# Patient Record
Sex: Female | Born: 1935
Health system: Southern US, Community
[De-identification: ages and names within clinical notes are randomized; demographics above are authoritative.]

## PROBLEM LIST (undated history)

## (undated) DIAGNOSIS — I10 Essential (primary) hypertension: Secondary | ICD-10-CM

## (undated) DIAGNOSIS — E0590 Thyrotoxicosis, unspecified without thyrotoxic crisis or storm: Secondary | ICD-10-CM

## (undated) DIAGNOSIS — Z7189 Other specified counseling: Secondary | ICD-10-CM

## (undated) DIAGNOSIS — R0609 Other forms of dyspnea: Secondary | ICD-10-CM

## (undated) DIAGNOSIS — A088 Other specified intestinal infections: Secondary | ICD-10-CM

## (undated) DIAGNOSIS — R0902 Hypoxemia: Secondary | ICD-10-CM

## (undated) DIAGNOSIS — C8583 Other specified types of non-Hodgkin lymphoma, intra-abdominal lymph nodes: Secondary | ICD-10-CM

## (undated) DIAGNOSIS — E119 Type 2 diabetes mellitus without complications: Secondary | ICD-10-CM

## (undated) DIAGNOSIS — R0989 Other specified symptoms and signs involving the circulatory and respiratory systems: Secondary | ICD-10-CM

## (undated) DIAGNOSIS — K909 Intestinal malabsorption, unspecified: Secondary | ICD-10-CM

## (undated) DIAGNOSIS — R630 Anorexia: Secondary | ICD-10-CM

## (undated) DIAGNOSIS — J189 Pneumonia, unspecified organism: Secondary | ICD-10-CM

## (undated) DIAGNOSIS — C8307 Small cell B-cell lymphoma, spleen: Secondary | ICD-10-CM

## (undated) HISTORY — DX: Hypoxemia: R09.02

## (undated) HISTORY — DX: Small cell b-cell lymphoma, spleen: C83.07

## (undated) HISTORY — PX: CATARACT EXTRACTION: SUR2

## (undated) HISTORY — DX: Intestinal malabsorption, unspecified: K90.9

## (undated) HISTORY — DX: Pneumonia, unspecified organism: J18.9

## (undated) HISTORY — DX: Other specified symptoms and signs involving the circulatory and respiratory systems: R09.89

## (undated) HISTORY — DX: Other specified types of non-hodgkin lymphoma, intra-abdominal lymph nodes: C85.83

## (undated) HISTORY — DX: Other specified intestinal infections: A08.8

## (undated) HISTORY — DX: Other forms of dyspnea: R06.09

## (undated) HISTORY — DX: Anorexia: R63.0

## (undated) HISTORY — DX: Thyrotoxicosis, unspecified without thyrotoxic crisis or storm: E05.90

---
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
## 2010-09-02 DIAGNOSIS — I379 Nonrheumatic pulmonary valve disorder, unspecified: Secondary | ICD-10-CM | POA: Insufficient documentation

## 2011-09-14 ENCOUNTER — Telehealth: Payer: Self-pay | Admitting: Hematology & Oncology

## 2011-09-14 NOTE — Telephone Encounter (Signed)
Pt aware of 7-26 appointment

## 2011-09-15 ENCOUNTER — Other Ambulatory Visit: Payer: Self-pay

## 2011-09-16 ENCOUNTER — Ambulatory Visit (HOSPITAL_BASED_OUTPATIENT_CLINIC_OR_DEPARTMENT_OTHER): Payer: Medicare Other | Admitting: Medical

## 2011-09-16 ENCOUNTER — Other Ambulatory Visit (HOSPITAL_BASED_OUTPATIENT_CLINIC_OR_DEPARTMENT_OTHER): Payer: Medicare Other | Admitting: Lab

## 2011-09-16 ENCOUNTER — Ambulatory Visit: Payer: Medicare Other

## 2011-09-16 ENCOUNTER — Ambulatory Visit: Payer: Medicare Other | Admitting: Hematology & Oncology

## 2011-09-16 ENCOUNTER — Encounter: Payer: Self-pay | Admitting: Medical

## 2011-09-16 VITALS — BP 111/62 | HR 88 | Temp 97.1°F | Ht <= 58 in | Wt 153.0 lb

## 2011-09-16 DIAGNOSIS — I517 Cardiomegaly: Secondary | ICD-10-CM

## 2011-09-16 DIAGNOSIS — I079 Rheumatic tricuspid valve disease, unspecified: Secondary | ICD-10-CM

## 2011-09-16 DIAGNOSIS — IMO0002 Reserved for concepts with insufficient information to code with codable children: Secondary | ICD-10-CM

## 2011-09-16 DIAGNOSIS — R809 Proteinuria, unspecified: Secondary | ICD-10-CM | POA: Insufficient documentation

## 2011-09-16 DIAGNOSIS — I10 Essential (primary) hypertension: Secondary | ICD-10-CM

## 2011-09-16 DIAGNOSIS — K219 Gastro-esophageal reflux disease without esophagitis: Secondary | ICD-10-CM

## 2011-09-16 DIAGNOSIS — R209 Unspecified disturbances of skin sensation: Secondary | ICD-10-CM

## 2011-09-16 DIAGNOSIS — R0789 Other chest pain: Secondary | ICD-10-CM

## 2011-09-16 DIAGNOSIS — K5730 Diverticulosis of large intestine without perforation or abscess without bleeding: Secondary | ICD-10-CM

## 2011-09-16 DIAGNOSIS — R161 Splenomegaly, not elsewhere classified: Secondary | ICD-10-CM

## 2011-09-16 DIAGNOSIS — T783XXA Angioneurotic edema, initial encounter: Secondary | ICD-10-CM

## 2011-09-16 DIAGNOSIS — I059 Rheumatic mitral valve disease, unspecified: Secondary | ICD-10-CM

## 2011-09-16 DIAGNOSIS — E119 Type 2 diabetes mellitus without complications: Secondary | ICD-10-CM | POA: Insufficient documentation

## 2011-09-16 DIAGNOSIS — E785 Hyperlipidemia, unspecified: Secondary | ICD-10-CM

## 2011-09-16 DIAGNOSIS — R252 Cramp and spasm: Secondary | ICD-10-CM

## 2011-09-16 DIAGNOSIS — D509 Iron deficiency anemia, unspecified: Secondary | ICD-10-CM

## 2011-09-16 DIAGNOSIS — D649 Anemia, unspecified: Secondary | ICD-10-CM

## 2011-09-16 DIAGNOSIS — M546 Pain in thoracic spine: Secondary | ICD-10-CM

## 2011-09-16 DIAGNOSIS — R935 Abnormal findings on diagnostic imaging of other abdominal regions, including retroperitoneum: Secondary | ICD-10-CM

## 2011-09-16 DIAGNOSIS — I359 Nonrheumatic aortic valve disorder, unspecified: Secondary | ICD-10-CM

## 2011-09-16 DIAGNOSIS — R059 Cough, unspecified: Secondary | ICD-10-CM

## 2011-09-16 DIAGNOSIS — I499 Cardiac arrhythmia, unspecified: Secondary | ICD-10-CM

## 2011-09-16 DIAGNOSIS — R109 Unspecified abdominal pain: Secondary | ICD-10-CM | POA: Insufficient documentation

## 2011-09-16 DIAGNOSIS — R05 Cough: Secondary | ICD-10-CM

## 2011-09-16 DIAGNOSIS — I379 Nonrheumatic pulmonary valve disorder, unspecified: Secondary | ICD-10-CM | POA: Insufficient documentation

## 2011-09-16 DIAGNOSIS — L299 Pruritus, unspecified: Secondary | ICD-10-CM

## 2011-09-16 DIAGNOSIS — G4710 Hypersomnia, unspecified: Secondary | ICD-10-CM

## 2011-09-16 DIAGNOSIS — G5600 Carpal tunnel syndrome, unspecified upper limb: Secondary | ICD-10-CM

## 2011-09-16 DIAGNOSIS — J309 Allergic rhinitis, unspecified: Secondary | ICD-10-CM

## 2011-09-16 DIAGNOSIS — E876 Hypokalemia: Secondary | ICD-10-CM

## 2011-09-16 DIAGNOSIS — M1990 Unspecified osteoarthritis, unspecified site: Secondary | ICD-10-CM

## 2011-09-16 DIAGNOSIS — M25519 Pain in unspecified shoulder: Secondary | ICD-10-CM

## 2011-09-16 DIAGNOSIS — Z79899 Other long term (current) drug therapy: Secondary | ICD-10-CM

## 2011-09-16 LAB — CBC WITH DIFFERENTIAL (CANCER CENTER ONLY)
BASO#: 0 10*3/uL (ref 0.0–0.2)
Eosinophils Absolute: 0.1 10*3/uL (ref 0.0–0.5)
HGB: 9.2 g/dL — ABNORMAL LOW (ref 11.6–15.9)
LYMPH#: 3.4 10*3/uL — ABNORMAL HIGH (ref 0.9–3.3)
MONO#: 0.6 10*3/uL (ref 0.1–0.9)
MONO%: 7.9 % (ref 0.0–13.0)
NEUT#: 3.9 10*3/uL (ref 1.5–6.5)
Platelets: 205 10*3/uL (ref 145–400)
RBC: 3.03 10*6/uL — ABNORMAL LOW (ref 3.70–5.32)
WBC: 8 10*3/uL (ref 3.9–10.0)

## 2011-09-16 LAB — CHCC SATELLITE - SMEAR

## 2011-09-16 LAB — TECHNOLOGIST REVIEW CHCC SATELLITE

## 2011-09-16 LAB — RETICULOCYTES (CHCC): Retic Ct Pct: 6.2 % — ABNORMAL HIGH (ref 0.4–2.3)

## 2011-09-16 NOTE — H&P (Signed)
NEW PATIENT EVALUATION  . Patient ID: Kristine Sullivan, female   DOB: April 19, 1931, 78 y.o.   MRN: [id number]  Chief Complaint: "I was told I am anemic and my spleen is enlarged."  HPI Kristine Sullivan is a 78 y.o. female who was referred to our office by Dr. Willa Rough at internal [facility].  This is a very pleasant, lady, with multiple medical problems, such as diabetes, hypertension, left ventricular hypertrophy, mitral regurgitation, pulmonary valve regurgitation, and tricuspid regurgitation.  Kristine Sullivan also presents with polypharmacy.  Kristine Sullivan recently had seen her primary care physician at which time had received information from Ms. [facility] allergist.  He allergist had noted from prior blood work that Kristine Sullivan had an elevated total bilirubin, as well as anemia, and as such, ordered an abdominal ultrasound to evaluate the abnormal labs.  The upper abdominal ultrasound revealed no evidence of cholelithiasis, gallbladder wall thickening, or pericholecystic fluid.  No biliary dilation with common duct, measuring 3 mm.  No focal mass apparent within the liver.  There is enlargement of the spleen, measuring 19 cm in length.  No focal intrasplenic masses apparent.  The pancreas, kidneys, aorta, and IVC are unremarkable.  Kristine Sullivan had a wide range of labs performed on her including an LDH that was abnormal at 291.  Her haptoglobin was normal at 54 reticulocyte.  Count at 4.5%.  Her hemoglobin at that time was 10.1, hematocrit 31.2, MCV 90.2, platelets 182,000.  She had an iron panel iron was 79, transferrin 352.5, TIBC 494, and 16% saturation.  Her ferritin was 69.4, microalbumin was elevated at 2.6, BUN 12, creatinine 0.8, total bili 1.7, AST 26, ALT 33, albumin 4.0.  Her sedimentation rate was elevated at 90.  Her C. reactive protein was elevated at 2.3.  There is obviously, some type of process going on that's causing her to lose, red blood cells, prematurely.  Overall, Kristine Sullivan, reports, that for  the most part.  She feels relatively well.  She does not report any type of fevers, chills, or night sweats.  She denies any enlarged lymph nodes.  She denies any type of abdominal pain, or back pain.  She has an excellent appetite.  She denies any nausea, vomiting, diarrhea, or constipation.  She denies any cough, chest pain, or shortness of breath.  She denies any dizziness or syncope.  She denies any obvious, bleeding.  She denies any excessive fatigue.  She does have muscle cramps in her feet, but denies any type of lower any edema.  She denies any bowel or bladder issues. HPI-see above    Past Medical History  Diagnosis Date  . Anorexia   . Pneumonia, organism unspecified   . Other dyspnea and respiratory abnormality   . Thyrotoxicosis without mention of goiter or other cause, without mention of thyrotoxic crisis or storm   . Hypoxemia   . Intestinal infection due to other organism, not elsewhere classified     Past Surgical History  Procedure Date  . Cataract extraction     No family history on file.  Social History History  Substance Use Topics  . Smoking status: former  . Smokeless tobacco: none  . Alcohol Use: none    No Known Allergies  Current Outpatient Prescriptions  Medication Sig Dispense Refill  . amLODipine (NORVASC) 10 MG tablet Take 10 mg by mouth daily.      [name] aspirin 325 MG tablet Take 325 mg by mouth daily.      . danazol (  DANOCRINE) 100 MG capsule Take 100 mg by mouth daily. For angioedema      . ferrous sulfate 325 (65 FE) MG tablet Take 325 mg by mouth daily with breakfast.      . fish oil-omega-3 fatty acids 1000 MG capsule Take by mouth daily. 6 tabs po daily with meals divided doses      . furosemide (LASIX) 40 MG tablet Take 40 mg by mouth daily.      . hydrOXYzine (ATARAX/VISTARIL) 25 MG tablet Take 25 mg by mouth at bedtime as needed.      . lovastatin (MEVACOR) 40 MG tablet Take 40 mg by mouth 2 (two) times daily.      . metFORMIN  (GLUCOPHAGE-XR) 750 MG 24 hr tablet Take 750 mg by mouth 2 (two) times daily.      . potassium chloride (K-DUR,KLOR-CON) 10 MEQ tablet Take 10 mEq by mouth daily with breakfast.      . timolol (TIMOPTIC) 0.5 % ophthalmic solution 1 drop 2 (two) times daily.      [name] triamterene-hydrochlorothiazide (DYAZIDE) 37.5-25 MG per capsule Take 1 capsule by mouth every morning.         Review of Systems Significant for b/l foot cramps otherwise, Pt. Denies any changes in their vision, hearing, adenopathy, fevers, chills, nausea, vomiting, diarrhea, constipation, chest pain, shortness of breath, passing blood, passing out, blacking out,  any changes in skin, joints, neurologic or psychiatric except as noted. Blood pressure 111/62, pulse 88, temperature 97.1 F (36.2 C), temperature source Oral, height 4\' 10"  (1.473 m), weight 153 lb (69.4 kg).   Physical Exam this is a pleasant, 78 year old, African American, female, in no obvious distress Vitals:Blood pressure 111/62, pulse 88, temperature 97.1 F (36.2 C), temperature source Oral, height 4\' 10"  (1.473 m), weight 153 lb (69.4 kg) HEENT reveals a normocephalic, atraumatic skull, no scleral icterus, no oral lesions  Neck is supple without any cervical or supraclavicular adenopathy.  Lungs are clear to auscultation bilaterally. There are no wheezes, rales or rhonci Cardiac is regular rate and rhythm with a normal S1 and S2. There are no murmurs, rubs, or bruits.  Abdomen is soft with good bowel sounds there's a palpable mass. There is no palpable hepatomegaly. There is no palpable fluid wave. +palpable spleen with mild ttp Musculoskeletal no tenderness of the spine, ribs, or hips.  Extremities there are no clubbing, cyanosis, or edema.  Skin no petechia, purpura or ecchymosis Neurologic is nonfocal.   Data Reviewed IMAGING: UPPER ABDOMINAL U/S 07/26/11 no evidence of cholelithiasis, gallbladder wall thickening, or pericholecystic fluid.  No biliary  dilation with common duct, measuring 3 mm.  No focal mass apparent within the liver.  There is enlargement of the spleen, measuring 19 cm in length.  No focal intrasplenic masses apparent.  The pancreas, kidneys, aorta, and IVC are unremarkable.   Laboratory Data: White count 8.0, hemoglobin 9.2, hematocrit 27.4, platelets 205,000, MCV 90 Peripheral smear revealed spherocytes, nucleated red cells, and cleaved lymphocytes   Assessment/Plan: This is a pleasant, 77 year old, African American, female, with the following issues: #1 anemia with splenomegaly-this possibly could be lymphoma of the spleen.  As such, we will go ahead and have interventional radiology do a bone marrow, biopsy.  Ms. Mccombs is supposed to get back in touch with [place] as to when she can do this hopefully sometime next week.  She does understand the necessity of this.  I do not feel this is iron deficiency.  As such, she was  informed to stop her iron.  We will go ahead and did her on folic acid.  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  I do not feel this is a myelofibrosis.  We will go ahead and run an SPEP on her to see if there is an M spike detected, but I really feel the bone marrow, biopsy is going to be definitive.  Once we get the results back from the bone marrow, biopsy.  We will know which direction to move in.  #2 followup-we will follow back up with Ms. Cabriales after we get the results of her bone marrow, biopsy.  We will see her before then should there be questions or concerns.  The above assessment and plan was in collaboration with Dr. Myna Hidalgo, who also saw and examined the patient.               Eunice Blase 09/16/2011, 10:23 AM

## 2011-09-16 NOTE — Progress Notes (Signed)
This office note has been dictated.

## 2011-09-28 LAB — DIRECT ANTIGLOBULIN TEST (NOT AT ARMC)
DAT (Complement): NEGATIVE
DAT IgG: NEGATIVE

## 2011-09-28 LAB — RBC OSMOTIC FRAGILITY
NACL  0.40%: 72
NACL  0.45%: 57
NACL  0.55%: 21
NACL  0.60%: 10
NACL  0.65%: 3

## 2011-09-28 LAB — PROTEIN ELECTROPHORESIS, SERUM
Albumin ELP: 59.9 % (ref 55.8–66.1)
Alpha-1-Globulin: 7.3 % — ABNORMAL HIGH (ref 2.9–4.9)
Beta 2: 4.8 % (ref 3.2–6.5)
Gamma Globulin: 9.7 % — ABNORMAL LOW (ref 11.1–18.8)
Total Protein, Serum Electrophoresis: 6.3 g/dL (ref 6.0–8.3)

## 2011-09-28 LAB — FERRITIN: Ferritin: 234 ng/mL (ref 10–291)

## 2011-09-28 LAB — RETICULOCYTES (CHCC)
ABS Retic: 214.5 10*3/uL — ABNORMAL HIGH (ref 19.0–186.0)
RBC.: 3.46 MIL/uL — ABNORMAL LOW (ref 3.87–5.11)

## 2011-09-28 LAB — IRON AND TIBC: TIBC: 528 ug/dL — ABNORMAL HIGH (ref 250–470)

## 2011-09-28 LAB — HAPTOGLOBIN: Haptoglobin: 58 mg/dL (ref 45–215)

## 2011-10-03 ENCOUNTER — Encounter: Payer: Self-pay | Admitting: *Deleted

## 2011-10-03 NOTE — Progress Notes (Unsigned)
Per Dr. Myna Hidalgo, pt to have Bone Marrow Bx Tuesday 10/11/11 at 0800.  Pt to be here at 0745.  Called pt to give her this date and time.  States this is a good time for here.

## 2011-10-11 ENCOUNTER — Other Ambulatory Visit (HOSPITAL_COMMUNITY)
Admission: RE | Admit: 2011-10-11 | Discharge: 2011-10-11 | Disposition: A | Discharge status: Home / Self Care | Payer: Medicare Other | Source: Ambulatory Visit | Attending: Hematology & Oncology | Admitting: Hematology & Oncology

## 2011-10-11 ENCOUNTER — Other Ambulatory Visit: Payer: Self-pay | Admitting: Hematology & Oncology

## 2011-10-11 ENCOUNTER — Other Ambulatory Visit: Payer: Medicare Other | Admitting: Lab

## 2011-10-11 ENCOUNTER — Ambulatory Visit: Payer: Medicare Other

## 2011-10-11 ENCOUNTER — Encounter (HOSPITAL_COMMUNITY)
Admission: RE | Admit: 2011-10-11 | Discharge: 2011-10-11 | Disposition: A | Discharge status: Home / Self Care | Payer: Medicare Other | Source: Ambulatory Visit | Attending: Hematology & Oncology | Admitting: Hematology & Oncology

## 2011-10-11 ENCOUNTER — Ambulatory Visit (HOSPITAL_BASED_OUTPATIENT_CLINIC_OR_DEPARTMENT_OTHER): Payer: Medicare Other | Admitting: Hematology & Oncology

## 2011-10-11 VITALS — BP 108/64 | HR 85 | Temp 97.8°F | Resp 18

## 2011-10-11 VITALS — BP 120/71 | HR 89 | Temp 98.6°F | Resp 20 | Ht 60.0 in | Wt 152.0 lb

## 2011-10-11 DIAGNOSIS — D649 Anemia, unspecified: Secondary | ICD-10-CM

## 2011-10-11 DIAGNOSIS — R161 Splenomegaly, not elsewhere classified: Secondary | ICD-10-CM | POA: Insufficient documentation

## 2011-10-11 DIAGNOSIS — D599 Acquired hemolytic anemia, unspecified: Secondary | ICD-10-CM | POA: Insufficient documentation

## 2011-10-11 DIAGNOSIS — D589 Hereditary hemolytic anemia, unspecified: Secondary | ICD-10-CM

## 2011-10-11 DIAGNOSIS — E119 Type 2 diabetes mellitus without complications: Secondary | ICD-10-CM

## 2011-10-11 LAB — CBC WITH DIFFERENTIAL (CANCER CENTER ONLY)
BASO%: 0.2 % (ref 0.0–2.0)
HCT: 17.5 % — ABNORMAL LOW (ref 34.8–46.6)
LYMPH%: 44.1 % (ref 14.0–48.0)
MCH: 34.7 pg — ABNORMAL HIGH (ref 26.0–34.0)
MCV: 105 fL — ABNORMAL HIGH (ref 81–101)
MONO%: 6.1 % (ref 0.0–13.0)
NEUT%: 48.8 % (ref 39.6–80.0)
Platelets: 222 10*3/uL (ref 145–400)
RDW: 30.9 % — ABNORMAL HIGH (ref 11.1–15.7)

## 2011-10-11 LAB — TECHNOLOGIST REVIEW CHCC SATELLITE

## 2011-10-11 LAB — RETICULOCYTES (CHCC): Retic Ct Pct: 17.2 % — ABNORMAL HIGH (ref 0.4–2.3)

## 2011-10-11 LAB — HOLD TUBE, BLOOD BANK - CHCC SATELLITE

## 2011-10-11 LAB — ABO/RH: ABO/RH(D): A POS

## 2011-10-11 MED ORDER — ACETAMINOPHEN 325 MG PO TABS
650.0000 mg | ORAL_TABLET | Freq: Once | ORAL | Status: AC
  Administered 2011-10-11: 650 mg via ORAL

## 2011-10-11 MED ORDER — SODIUM CHLORIDE 0.9 % IV SOLN: 250.0000 mL | Freq: Once | INTRAVENOUS | Status: AC

## 2011-10-11 NOTE — Progress Notes (Signed)
We did a bone marrow biopsy and aspirate today on [name]. She came to the treatment room at our cancer office. She signed consent. We do the appropriate timeout procedure.  She was placed onto her right side. The left posterior iliac crest region was prepped and draped in sterile fashion. 8 cc of 2% lidocaine were infiltrated under the skin down to the periosteum.  A #11 scalpel was used to make an incision into the skin. We then obtained a bone marrow aspirate without success. We tried this on several occasions.  We then used the Jamshidi bone marrow biopsy needle. When I was locked into the marrow space, I tried another aspirate with success. This was sent off for flow cytometry and cytogenetics.  We got 2 bone marrow biopsies.  With her CBC, with her hemoglobin was down to 5.8. She has homolysis of unclear urology. All of her studies have come up unremarkable. She does have elevated LDH of 450. She does not have a monoclonal spike on SPEP. Her Coombs analysis is negative for an autoimmune homolysis.  We will transfuse her with 2 units of blood. I am sending off a cold agglutinin titer. She does have an enlarged spleen. I am considering a splenic lymphoma or other low-grade lymphoma as a possibility for this homolysis.  We will put her on folic acid. She is diabetic so I want to try to on steroids.  We will get her back in a week so we can check her blood count and hopefully have a diagnosis for her so we can move ahead with definitive therapy.  She is a little bit short of breath. There's no chest pain. Is no bleeding. There is no abdominal pain. She's had no rashes. There is no change in her medications.  Again, we will transfuse her today. I will use a blood warmer on the possibility of this being cold agglutinins.  Pete E.

## 2011-10-11 NOTE — Patient Instructions (Signed)
Blood Transfusion Information  WHAT IS A BLOOD TRANSFUSION?  A transfusion is the replacement of blood or some of its parts. Blood is made up of multiple cells which provide different functions.   Red blood cells carry oxygen and are used for blood loss replacement.   White blood cells fight against infection.   Platelets control bleeding.   Plasma helps clot blood.   Other blood products are available for specialized needs, such as hemophilia or other clotting disorders.  BEFORE THE TRANSFUSION   Who gives blood for transfusions?    You may be able to donate blood to be used at a later date on yourself (autologous donation).   Relatives can be asked to donate blood. This is generally not any safer than if you have received blood from a stranger. The same precautions are taken to ensure safety when a relative's blood is donated.   Healthy volunteers who are fully evaluated to make sure their blood is safe. This is blood bank blood.  Transfusion therapy is the safest it has ever been in the practice of medicine. Before blood is taken from a donor, a complete history is taken to make sure that person has no history of diseases nor engages in risky social behavior (examples are intravenous drug use or sexual activity with multiple partners). The donor's travel history is screened to minimize risk of transmitting infections, such as malaria. The donated blood is tested for signs of infectious diseases, such as HIV and hepatitis. The blood is then tested to be sure it is compatible with you in order to minimize the chance of a transfusion reaction. If you or a relative donates blood, this is often done in anticipation of surgery and is not appropriate for emergency situations. It takes many days to process the donated blood.  RISKS AND COMPLICATIONS  Although transfusion therapy is very safe and saves many lives, the main dangers of transfusion include:    Getting an infectious disease.   Developing a  transfusion reaction. This is an allergic reaction to something in the blood you were given. Every precaution is taken to prevent this.  The decision to have a blood transfusion has been considered carefully by your caregiver before blood is given. Blood is not given unless the benefits outweigh the risks.  AFTER THE TRANSFUSION   Right after receiving a blood transfusion, you will usually feel much better and more energetic. This is especially true if your red blood cells have gotten low (anemic). The transfusion raises the level of the red blood cells which carry oxygen, and this usually causes an energy increase.   The nurse administering the transfusion will monitor you carefully for complications.  HOME CARE INSTRUCTIONS   No special instructions are needed after a transfusion. You may find your energy is better. Speak with your caregiver about any limitations on activity for underlying diseases you may have.  SEEK MEDICAL CARE IF:    Your condition is not improving after your transfusion.   You develop redness or irritation at the intravenous (IV) site.  SEEK IMMEDIATE MEDICAL CARE IF:   Any of the following symptoms occur over the next 12 hours:   Shaking chills.   You have a temperature by mouth above 102 F (38.9 C), not controlled by medicine.   Chest, back, or muscle pain.   People around you feel you are not acting correctly or are confused.   Shortness of breath or difficulty breathing.   Dizziness and fainting.     You get a rash or develop hives.   You have a decrease in urine output.   Your urine turns a dark color or changes to pink, red, or brown.  Any of the following symptoms occur over the next 10 days:   You have a temperature by mouth above 102 F (38.9 C), not controlled by medicine.   Shortness of breath.   Weakness after normal activity.   The white part of the eye turns yellow (jaundice).   You have a decrease in the amount of urine or are urinating less often.   Your  urine turns a dark color or changes to pink, red, or brown.  Document Released: 02/05/2000 Document Revised: 01/27/2011 Document Reviewed: 09/24/2007  [employer] Patient Information 2012 [employer].

## 2011-10-11 NOTE — Patient Instructions (Signed)
Bone Marrow Aspiration, Bone Marrow Biopsy Care After  Read the instructions outlined below and refer to this sheet in the next few weeks. These discharge instructions provide you with general information on caring for yourself after you leave the hospital. Your caregiver may also give you specific instructions. While your treatment has been planned according to the most current medical practices available, unavoidable complications occasionally occur. If you have any problems or questions after discharge, call your caregiver.  FINDING OUT THE RESULTS OF YOUR TEST  Please make sure you have an appointment with your caregiver to find out the results . Do not assume everything is normal if you have not heard from your caregiver or the medical facility. It is important for you to follow up on all of your test results. Dr. Myna Hidalgo may call you with the results.  HOME CARE INSTRUCTIONS   You have had sedation and may be sleepy or dizzy. Your thinking may not be as clear as usual. For the next 24 hours:   Only take over-the-counter or prescription medicines for pain, discomfort, and or fever as directed by your caregiver.   Keep your dressing clean and dry. You may replace dressing with a bandage after 24 hours.   You may take a bath or shower after 24 hours.   Use an ice pack for 20 minutes every 2 hours while awake for pain as needed.  SEEK MEDICAL CARE IF:   There is redness, swelling, or increasing pain at the biopsy site.   There is pus coming from the biopsy site.   There is drainage from a biopsy site lasting longer than one day.   An unexplained oral temperature above 102 F (38.9 C) develops.  SEEK IMMEDIATE MEDICAL CARE IF:   You develop a rash.   You have difficulty breathing.   You develop any reaction or side effects to medications given.  Document Released: 08/27/2004 Document Revised: 01/27/2011 Document Reviewed: 02/05/2008 [facility] Patient Information 2012  [place], [place].

## 2011-10-12 ENCOUNTER — Encounter: Payer: Self-pay | Admitting: Hematology & Oncology

## 2011-10-12 LAB — TYPE AND SCREEN
Antibody Screen: POSITIVE
DAT, IgG: NEGATIVE

## 2011-10-13 ENCOUNTER — Encounter: Payer: Self-pay | Admitting: Hematology & Oncology

## 2011-10-13 ENCOUNTER — Telehealth: Payer: Self-pay | Admitting: Oncology

## 2011-10-13 NOTE — Telephone Encounter (Signed)
5:23 PM 10/13/2011 Left Ms Latterell a message instructing her to take Folic Acid 2mg  daily. Teola Bradley, Pansie Guggisberg [employer]

## 2011-10-14 LAB — COLD AGGLUTININ TITER: Cold Agglutinin Titer: 1:40 {titer} — AB

## 2011-10-17 ENCOUNTER — Telehealth: Payer: Self-pay | Admitting: Hematology & Oncology

## 2011-10-17 NOTE — Telephone Encounter (Signed)
April for [facility] called and cx patient's appt for 10/18/11 due to patient being in the hospital.  Patient will call back later to resch

## 2011-10-18 ENCOUNTER — Ambulatory Visit: Payer: Medicare Other | Admitting: Medical

## 2011-10-18 ENCOUNTER — Other Ambulatory Visit: Payer: Medicare Other | Admitting: Lab

## 2011-10-25 ENCOUNTER — Encounter (HOSPITAL_COMMUNITY)
Admission: RE | Admit: 2011-10-25 | Discharge: 2011-10-25 | Disposition: A | Discharge status: Home / Self Care | Payer: Medicare Other | Source: Ambulatory Visit | Attending: Hematology & Oncology | Admitting: Hematology & Oncology

## 2011-10-25 DIAGNOSIS — D599 Acquired hemolytic anemia, unspecified: Secondary | ICD-10-CM | POA: Insufficient documentation

## 2011-10-26 ENCOUNTER — Telehealth: Payer: Self-pay | Admitting: Hematology & Oncology

## 2011-10-26 ENCOUNTER — Other Ambulatory Visit (HOSPITAL_BASED_OUTPATIENT_CLINIC_OR_DEPARTMENT_OTHER): Payer: Medicare Other | Admitting: Lab

## 2011-10-26 ENCOUNTER — Ambulatory Visit (HOSPITAL_BASED_OUTPATIENT_CLINIC_OR_DEPARTMENT_OTHER): Payer: Medicare Other | Admitting: Hematology & Oncology

## 2011-10-26 VITALS — Temp 98.6°F | Resp 20 | Ht 60.0 in | Wt 147.0 lb

## 2011-10-26 DIAGNOSIS — C8307 Small cell B-cell lymphoma, spleen: Secondary | ICD-10-CM

## 2011-10-26 DIAGNOSIS — C8589 Other specified types of non-Hodgkin lymphoma, extranodal and solid organ sites: Secondary | ICD-10-CM

## 2011-10-26 DIAGNOSIS — D599 Acquired hemolytic anemia, unspecified: Secondary | ICD-10-CM

## 2011-10-26 DIAGNOSIS — C8590 Non-Hodgkin lymphoma, unspecified, unspecified site: Secondary | ICD-10-CM

## 2011-10-26 DIAGNOSIS — D589 Hereditary hemolytic anemia, unspecified: Secondary | ICD-10-CM

## 2011-10-26 DIAGNOSIS — R161 Splenomegaly, not elsewhere classified: Secondary | ICD-10-CM

## 2011-10-26 LAB — CBC WITH DIFFERENTIAL (CANCER CENTER ONLY)
BASO%: 0.2 % (ref 0.0–2.0)
Eosinophils Absolute: 0.1 10*3/uL (ref 0.0–0.5)
LYMPH#: 5.9 10*3/uL — ABNORMAL HIGH (ref 0.9–3.3)
LYMPH%: 39.3 % (ref 14.0–48.0)
MCV: 110 fL — ABNORMAL HIGH (ref 81–101)
MONO#: 1 10*3/uL — ABNORMAL HIGH (ref 0.1–0.9)
Platelets: 179 10*3/uL (ref 145–400)
RBC: 2.1 10*6/uL — ABNORMAL LOW (ref 3.70–5.32)
RDW: 29.2 % — ABNORMAL HIGH (ref 11.1–15.7)
WBC: 14.9 10*3/uL — ABNORMAL HIGH (ref 3.9–10.0)

## 2011-10-26 LAB — RETICULOCYTES (CHCC)
ABS Retic: 393.6 10*3/uL — ABNORMAL HIGH (ref 19.0–186.0)
RBC.: 2.4 MIL/uL — ABNORMAL LOW (ref 3.87–5.11)
Retic Ct Pct: 16.4 % — ABNORMAL HIGH (ref 0.4–2.3)

## 2011-10-26 LAB — CHCC SATELLITE - SMEAR

## 2011-10-26 LAB — TECHNOLOGIST REVIEW CHCC SATELLITE: Tech Review: 3

## 2011-10-26 NOTE — Telephone Encounter (Signed)
Left message for pt to come in on 10-27-11 at 10am

## 2011-10-27 ENCOUNTER — Other Ambulatory Visit: Payer: Self-pay | Admitting: Hematology & Oncology

## 2011-10-27 ENCOUNTER — Other Ambulatory Visit: Payer: Self-pay | Admitting: Physician Assistant

## 2011-10-27 ENCOUNTER — Encounter: Payer: Self-pay | Admitting: Hematology & Oncology

## 2011-10-27 ENCOUNTER — Ambulatory Visit (HOSPITAL_BASED_OUTPATIENT_CLINIC_OR_DEPARTMENT_OTHER): Payer: Medicare Other

## 2011-10-27 ENCOUNTER — Ambulatory Visit: Payer: Medicare Other

## 2011-10-27 ENCOUNTER — Encounter (HOSPITAL_COMMUNITY): Payer: Self-pay | Admitting: Pharmacy Technician

## 2011-10-27 VITALS — BP 106/50 | HR 80 | Temp 97.3°F | Resp 20

## 2011-10-27 VITALS — BP 103/65 | HR 80 | Temp 98.2°F | Resp 20

## 2011-10-27 DIAGNOSIS — C8307 Small cell B-cell lymphoma, spleen: Secondary | ICD-10-CM

## 2011-10-27 DIAGNOSIS — D599 Acquired hemolytic anemia, unspecified: Secondary | ICD-10-CM

## 2011-10-27 DIAGNOSIS — T783XXA Angioneurotic edema, initial encounter: Secondary | ICD-10-CM

## 2011-10-27 DIAGNOSIS — D649 Anemia, unspecified: Secondary | ICD-10-CM

## 2011-10-27 HISTORY — DX: Small cell b-cell lymphoma, spleen: C83.07

## 2011-10-27 LAB — PREPARE RBC (CROSSMATCH)

## 2011-10-27 MED ORDER — ACETAMINOPHEN 325 MG PO TABS
650.0000 mg | ORAL_TABLET | Freq: Once | ORAL | Status: AC
Start: 2011-10-27 — End: 2011-10-27
  Administered 2011-10-27: 650 mg via ORAL

## 2011-10-27 MED ORDER — SODIUM CHLORIDE 0.9 % IV SOLN
INTRAVENOUS | Status: DC
  Administered 2011-10-27: 11:00:00 via INTRAVENOUS

## 2011-10-27 MED ORDER — FUROSEMIDE 10 MG/ML IJ SOLN
20.0000 mg | Freq: Once | INTRAMUSCULAR | Status: AC
  Administered 2011-10-27: 20 mg via INTRAVENOUS

## 2011-10-27 NOTE — Progress Notes (Signed)
This office note has been dictated.

## 2011-10-27 NOTE — Patient Instructions (Addendum)
Blood Transfusion Information  WHAT IS A BLOOD TRANSFUSION?  A transfusion is the replacement of blood or some of its parts. Blood is made up of multiple cells which provide different functions.   Red blood cells carry oxygen and are used for blood loss replacement.   White blood cells fight against infection.   Platelets control bleeding.   Plasma helps clot blood.   Other blood products are available for specialized needs, such as hemophilia or other clotting disorders.  BEFORE THE TRANSFUSION   Who gives blood for transfusions?    You may be able to donate blood to be used at a later date on yourself (autologous donation).   Relatives can be asked to donate blood. This is generally not any safer than if you have received blood from a stranger. The same precautions are taken to ensure safety when a relative's blood is donated.   Healthy volunteers who are fully evaluated to make sure their blood is safe. This is blood bank blood.  Transfusion therapy is the safest it has ever been in the practice of medicine. Before blood is taken from a donor, a complete history is taken to make sure that person has no history of diseases nor engages in risky social behavior (examples are intravenous drug use or sexual activity with multiple partners). The donor's travel history is screened to minimize risk of transmitting infections, such as malaria. The donated blood is tested for signs of infectious diseases, such as HIV and hepatitis. The blood is then tested to be sure it is compatible with you in order to minimize the chance of a transfusion reaction. If you or a relative donates blood, this is often done in anticipation of surgery and is not appropriate for emergency situations. It takes many days to process the donated blood.  RISKS AND COMPLICATIONS  Although transfusion therapy is very safe and saves many lives, the main dangers of transfusion include:    Getting an infectious disease.   Developing a  transfusion reaction. This is an allergic reaction to something in the blood you were given. Every precaution is taken to prevent this.  The decision to have a blood transfusion has been considered carefully by your caregiver before blood is given. Blood is not given unless the benefits outweigh the risks.  AFTER THE TRANSFUSION   Right after receiving a blood transfusion, you will usually feel much better and more energetic. This is especially true if your red blood cells have gotten low (anemic). The transfusion raises the level of the red blood cells which carry oxygen, and this usually causes an energy increase.   The nurse administering the transfusion will monitor you carefully for complications.  HOME CARE INSTRUCTIONS   No special instructions are needed after a transfusion. You may find your energy is better. Speak with your caregiver about any limitations on activity for underlying diseases you may have.  SEEK MEDICAL CARE IF:    Your condition is not improving after your transfusion.   You develop redness or irritation at the intravenous (IV) site.  SEEK IMMEDIATE MEDICAL CARE IF:   Any of the following symptoms occur over the next 12 hours:   Shaking chills.   You have a temperature by mouth above 102 F (38.9 C), not controlled by medicine.   Chest, back, or muscle pain.   People around you feel you are not acting correctly or are confused.   Shortness of breath or difficulty breathing.   Dizziness and fainting.     You get a rash or develop hives.   You have a decrease in urine output.   Your urine turns a dark color or changes to pink, red, or brown.  Any of the following symptoms occur over the next 10 days:   You have a temperature by mouth above 102 F (38.9 C), not controlled by medicine.   Shortness of breath.   Weakness after normal activity.   The white part of the eye turns yellow (jaundice).   You have a decrease in the amount of urine or are urinating less often.   Your  urine turns a dark color or changes to pink, red, or brown.  Document Released: 02/05/2000 Document Revised: 01/27/2011 Document Reviewed: 09/24/2007  [employer] Patient Information 2012 [employer].

## 2011-10-28 ENCOUNTER — Other Ambulatory Visit: Payer: Self-pay | Admitting: Hematology & Oncology

## 2011-10-28 ENCOUNTER — Ambulatory Visit (HOSPITAL_COMMUNITY)
Admission: RE | Admit: 2011-10-28 | Discharge: 2011-10-28 | Disposition: A | Discharge status: Home / Self Care | Payer: Medicare Other | Source: Ambulatory Visit | Attending: Hematology & Oncology | Admitting: Hematology & Oncology

## 2011-10-28 DIAGNOSIS — C8589 Other specified types of non-Hodgkin lymphoma, extranodal and solid organ sites: Secondary | ICD-10-CM | POA: Insufficient documentation

## 2011-10-28 DIAGNOSIS — C8307 Small cell B-cell lymphoma, spleen: Secondary | ICD-10-CM

## 2011-10-28 DIAGNOSIS — C8590 Non-Hodgkin lymphoma, unspecified, unspecified site: Secondary | ICD-10-CM

## 2011-10-28 DIAGNOSIS — D599 Acquired hemolytic anemia, unspecified: Secondary | ICD-10-CM | POA: Insufficient documentation

## 2011-10-28 LAB — TYPE AND SCREEN
Antibody Screen: POSITIVE
Unit division: 0

## 2011-10-28 MED ORDER — CEFAZOLIN SODIUM 1-5 GM-% IV SOLN
INTRAVENOUS | Status: AC
  Administered 2011-10-28: 1000 mg
  Filled 2011-10-28: qty 50

## 2011-10-28 MED ORDER — LIDOCAINE HCL 1 % IJ SOLN
INTRAMUSCULAR | Status: AC
  Filled 2011-10-28: qty 20

## 2011-10-28 MED ORDER — FENTANYL CITRATE 0.05 MG/ML IJ SOLN
INTRAMUSCULAR | Status: AC
  Filled 2011-10-28: qty 4

## 2011-10-28 MED ORDER — FENTANYL CITRATE 0.05 MG/ML IJ SOLN
INTRAMUSCULAR | Status: AC | PRN
  Administered 2011-10-28: 100 ug via INTRAVENOUS

## 2011-10-28 MED ORDER — MIDAZOLAM HCL 5 MG/5ML IJ SOLN
INTRAMUSCULAR | Status: AC | PRN
  Administered 2011-10-28 (×2): 1 mg via INTRAVENOUS

## 2011-10-28 MED ORDER — SODIUM CHLORIDE 0.9 % IV SOLN: INTRAVENOUS | Status: DC

## 2011-10-28 MED ORDER — CEFAZOLIN SODIUM 1-5 GM-% IV SOLN: 1.0000 g | INTRAVENOUS | Status: DC

## 2011-10-28 MED ORDER — HEPARIN SOD (PORK) LOCK FLUSH 100 UNIT/ML IV SOLN
500.0000 [IU] | Freq: Once | INTRAVENOUS | Status: AC
  Administered 2011-10-28: 500 [IU] via INTRAVENOUS

## 2011-10-28 MED ORDER — MIDAZOLAM HCL 2 MG/2ML IJ SOLN
INTRAMUSCULAR | Status: AC
  Filled 2011-10-28: qty 4

## 2011-10-28 NOTE — H&P (Signed)
Kristine Sullivan is an 78 y.o. female.   Chief Complaint: poor venous access in patient with NHL and anemia. Presents today for port placement.  HPI: see oncology note below :  Kristine Macho, MD Physician Unsigned Transcription  Progress Notes 10/28/2011 5:10 AM  Related encounter: Office Visit from 10/26/2011 in [facility]  CC:   Vinnie Level, MD   DIAGNOSES: 1. Low-grade non-Hodgkin lymphoma. 2. Severe hemolytic anemia, Coombs negative.   CURRENT THERAPY:  Observation.   INTERIM HISTORY:  Kristine Sullivan comes in for a followup.  Unfortunately, Kristine Sullivan had recently been admitted to [facility].  Kristine Sullivan was admitted over there because of severe anemia.  Kristine Sullivan has a significant hemolytic anemia.  This is Coombs negative.  Kristine Sullivan was transfused, I think, 2 units while over at [facility].   Kristine Sullivan has splenomegaly.   We did go ahead and do a bone marrow biopsy on her.  The bone marrow biopsy showed that Kristine Sullivan had involvement with a B-cell non-Hodgkin lymphoma.  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   Again, her hemolysis has been worked up.  This hemolysis is Coombs negative.  Kristine Sullivan has elevated LDH of 148.   Kristine Sullivan did have a protein electrophoresis, which did not show a monoclonal spike.   Kristine Sullivan does feel tired.  Kristine Sullivan does have some swelling about the eyes.  Kristine Sullivan does have this angioedema.  Kristine Sullivan takes Danazol for this.   We really need to get started with therapy on her.  I have to believe that the hemolysis is related to her lymphoma.  Even though Kristine Sullivan is Coombs negative, I have to believe that this is, to some degree, autoimmune in nature.   Kristine Sullivan has had no fever.  Kristine Sullivan has not noticed any palpable lymph glands.   PHYSICAL EXAMINATION:  This is an elderly appearing black female in no obvious distress.  Vital signs:  Temperature of 98.6, pulse 69, respiratory rate 20, blood pressure 103/36.   Weight is 147.  Head and neck:  Normocephalic, atraumatic skull.  There are no ocular or oral lesions.  There are no palpable cervical or supraclavicular lymph nodes. Lungs:  Clear bilaterally.  Cardiac:  Regular rate and rhythm with a normal S1 and S2.  There are no murmurs, rubs or bruits. Abdomen:  Soft with good bowel sounds.  There is no palpable abdominal mass.  There is no fluid wave.  Her spleen tip is palpable just below the left costal margin.  Extremities:  Some trace edema in her lower legs.  Neurologic: No focal neurological deficits.   LABORATORY STUDIES:  White cell count is 14.9, hemoglobin 7.5, hematocrit 23, platelet count 179.  MCV is 110.   IMPRESSION:  [name] is a 78 year old female with a low-grade non- Hodgkin lymphoma.  Again, this has to be triggering this hemolytic process.   We are going to have to transfuse her.  I think Kristine Sullivan is going to need 2 units of blood.   Of note, we have checked cold agglutinins on her.  The cold agglutinin has been negative.   I think that Kristine Sullivan would do well being treated with Rituxan-CVP.  Rituxan is a treatment for angioedema.  Hopefully, we can take care of this issue.   Again, Kristine Sullivan needs to have a Port-A-Cath placed.  We will see about getting this placed in a couple days.   We will  get her in tomorrow for her transfusion.   Hopefully, if we get a quick response to treatment, we will be able to get this hemolysis limited.   I spoke to her and her son for about an hour. This is incredibly difficult situation.  It is not straightforward by any means.   Ms. Cerutti and her son both understand the critical nature of what we are dealing with.   We will plan to get her back tomorrow for blood.  Kristine Sullivan will come back on the 9th for chemotherapy.   We are going to basically have to see her weekly so that we can maintain close vigilance of her hematologic parameters.       ______________________________ Kristine Sullivan,  M.D. PRE/MEDQ  D:  10/27/2011  T:  10/28/2011  Job:  [id number]   Today's examination :    Past Medical History  Diagnosis Date  . Anorexia   . Pneumonia, organism unspecified   . Other dyspnea and respiratory abnormality   . Thyrotoxicosis without mention of goiter or other cause, without mention of thyrotoxic crisis or storm   . Hypoxemia   . Intestinal infection due to other organism, not elsewhere classified   . 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 Cataract extraction    Medication List  As of 10/28/2011  9:07 AM   ASK your doctor about these medications         amLODipine 10 MG tablet   Commonly known as: NORVASC   Take 10 mg by mouth at bedtime.      aspirin 325 MG tablet   Take 325 mg by mouth daily.      danazol 100 MG capsule   Commonly known as: DANOCRINE   Take 100 mg by mouth daily. For angioedema      fish oil-omega-3 fatty acids 1000 MG capsule   Take 1 g by mouth daily.      folic acid 1 MG tablet   Commonly known as: FOLVITE   Take 1 mg by mouth every morning.      furosemide 40 MG tablet   Commonly known as: LASIX   Take 40 mg by mouth daily.      hydrOXYzine 25 MG tablet   Commonly known as: ATARAX/VISTARIL   Take 25 mg by mouth at bedtime as needed. For itching      lovastatin 40 MG tablet   Commonly known as: MEVACOR   Take 40 mg by mouth at bedtime.      metFORMIN 500 MG tablet   Commonly known as: GLUCOPHAGE   Take 500 mg by mouth daily with breakfast.      timolol 0.5 % ophthalmic solution   Commonly known as: TIMOPTIC   Place 1 drop into both eyes 2 (two) times daily.      Travoprost (BAK Free) 0.004 % Soln ophthalmic solution   Commonly known as: TRAVATAN   Place 1 drop into both eyes at bedtime.      triamterene-hydrochlorothiazide 37.5-25 MG per capsule   Commonly known as: DYAZIDE   Take 1 capsule by mouth every morning.            Social History:  does not have a smoking  history on file. Kristine Sullivan does not have any smokeless tobacco history on file. Her alcohol and drug histories not on file.  Allergies: No Known Allergies   Results for orders placed in visit on 10/26/11 (  from the past 48 hour(s))  HOLD TUBE, BLOOD BANK - [facility]     Status: Normal   Collection Time   10/26/11  3:18 PM      Component Value Range Comment   Hold Tube,Blood Bank [facility] CROSSMATCH ADDED       Results for ULANDA, TACKETT (MRN [id number]) as of 10/28/2011 09:07  Ref. Range 10/26/2011 11:04  WBC Latest Range: 3.9-10.0 10e3/uL 14.9 (H)  RBC Latest Range: 3.70-5.32 10e6/uL 2.10 (L)  Hemoglobin Latest Range: 11.6-15.9 g/dL 7.5 (L)  HCT Latest Range: 34.8-46.6 % 23.0 (L)  MCV Latest Range: 81-101 fL 110 (H)  MCH Latest Range: 26.0-34.0 pg 35.7 (H)  MCHC Latest Range: 32.0-36.0 g/dL 30.8  RDW Latest Range: 11.1-15.7 % 29.2 (H)  Platelets Latest Range: 145-400 10e3/uL 179 Large platelets present  NEUT% Latest Range: 39.6-80.0 % 53.1  LYMPH% Latest Range: 14.0-48.0 % 39.3  MONO% Latest Range: 0.0-13.0 % 6.9  EOS% Latest Range: 0.0-7.0 % 0.5  BASO% Latest Range: 0.0-2.0 % 0.2  BASO# Latest Range: 0.0-0.2 10e3/uL 0.0  NEUT# Latest Range: 1.5-6.5 10e3/uL 7.9 (H)  MONO# Latest Range: 0.1-0.9 10e3/uL 1.0 (H)  Eosinophils Absolute Latest Range: 0.0-0.5 10e3/uL 0.1  lymph# Latest Range: 0.9-3.3 10e3/uL 5.9 (H)  Smear Result No range found Smear Available  Tech Review No range found 3% nRBC, occ myelo/meta, Mkd polychromasia, Few ovalocytes, Mod spherocytes        Review of Systems  Constitutional: Positive for malaise/fatigue. Negative for fever and chills.  Respiratory: Positive for shortness of breath. Negative for cough and hemoptysis.   Cardiovascular: Positive for leg swelling. Negative for chest pain and palpitations.  Neurological: Positive for weakness.    Blood pressure 114/65, pulse 76, temperature 98.6 F (37 C), temperature source Oral, resp. rate 16, SpO2  93.00%. Physical Exam  Constitutional: Kristine Sullivan is oriented to person, place, and time. Kristine Sullivan appears well-developed and well-nourished.  HENT:  Head: Normocephalic.  Eyes: Pupils are equal, round, and reactive to light.  Neck: Normal range of motion.  Cardiovascular: Normal rate and regular rhythm.  Exam reveals no gallop and no friction rub.   No murmur heard. Respiratory: Effort normal and breath sounds normal. No respiratory distress. Kristine Sullivan has no wheezes. Kristine Sullivan has no rales.  GI: Soft. Bowel sounds are normal.  Musculoskeletal: Normal range of motion. Kristine Sullivan exhibits no edema.  Lymphadenopathy:    Kristine Sullivan has no cervical adenopathy.  Neurological: Kristine Sullivan is alert and oriented to person, place, and time.  Skin: Skin is warm and dry.  Psychiatric: Kristine Sullivan has a normal mood and affect. Her behavior is normal. Judgment and thought content normal.     Assessment/Plan Procedure details discussed with patient with her apparent understanding. Labs reviewed and Kristine Sullivan is appropriate for [facility] placement. Written consent obtained.   CAMPBELL,PAMELA D 10/28/2011, 9:07 AM

## 2011-10-28 NOTE — Progress Notes (Signed)
CC:   Vinnie Level, MD  DIAGNOSES: 1. Low-grade non-Hodgkin lymphoma. 2. Severe hemolytic anemia, Coombs negative.  CURRENT THERAPY:  Observation.  INTERIM HISTORY:  Ms. Freet comes in for a followup.  Unfortunately, she had recently been admitted to [facility].  She was admitted over there because of severe anemia.  She has a significant hemolytic anemia.  This is Coombs negative.  She was transfused, I think, 2 units while over at [facility].  She has splenomegaly.  We did go ahead and do a bone marrow biopsy on her.  The bone marrow biopsy showed that she had involvement with a B-cell non-Hodgkin lymphoma.  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  Again, her hemolysis has been worked up.  This hemolysis is Coombs negative.  She has elevated LDH of 148.  She did have a protein electrophoresis, which did not show a monoclonal spike.  She does feel tired.  She does have some swelling about the eyes.  She does have this angioedema.  She takes Danazol for this.  We really need to get started with therapy on her.  I have to believe that the hemolysis is related to her lymphoma.  Even though she is Coombs negative, I have to believe that this is, to some degree, autoimmune in nature.  She has had no fever.  She has not noticed any palpable lymph glands.  PHYSICAL EXAMINATION:  This is an elderly appearing black female in no obvious distress.  Vital signs:  Temperature of 98.6, pulse 69, respiratory rate 20, blood pressure 103/36.  Weight is 147.  Head and neck:  Normocephalic, atraumatic skull.  There are no ocular or oral lesions.  There are no palpable cervical or supraclavicular lymph nodes. Lungs:  Clear bilaterally.  Cardiac:  Regular rate and rhythm with a normal S1 and S2.  There are no murmurs, rubs or bruits.  Abdomen:  Soft with good bowel sounds.  There is no palpable abdominal  mass.  There is no fluid wave.  Her spleen tip is palpable just below the left costal margin.  Extremities:  Some trace edema in her lower legs.  Neurologic: No focal neurological deficits.  LABORATORY STUDIES:  White cell count is 14.9, hemoglobin 7.5, hematocrit 23, platelet count 179.  MCV is 110.  IMPRESSION:  Ms. Maring is a 78 year old female with a low-grade non- Hodgkin lymphoma.  Again, this has to be triggering this hemolytic process.  We are going to have to transfuse her.  I think she is going to need 2 units of blood.  Of note, we have checked cold agglutinins on her.  The cold agglutinin has been negative.  I think that she would do well being treated with Rituxan-CVP.  Rituxan is a treatment for angioedema.  Hopefully, we can take care of this issue.  Again, she needs to have a Port-A-Cath placed.  We will see about getting this placed in a couple days.  We will get her in tomorrow for her transfusion.  Hopefully, if we get a quick response to treatment, we will be able to get this hemolysis limited.  I spoke to her and her son for about an hour. This is incredibly difficult situation.  It is not straightforward by any means.  Ms. Trafton and her son both understand the critical nature of what we are dealing with.  We will plan to get her back tomorrow for blood.  She will come back on the 9th for chemotherapy.  We are going to basically have to see her weekly so that we can maintain close vigilance of her hematologic parameters.    ______________________________ [name], M.D. PRE/MEDQ  D:  10/27/2011  T:  10/28/2011  Job:  [id number]

## 2011-10-28 NOTE — Procedures (Signed)
RIJV PAC tip SVC RA No comp 

## 2011-10-31 ENCOUNTER — Ambulatory Visit (HOSPITAL_BASED_OUTPATIENT_CLINIC_OR_DEPARTMENT_OTHER): Payer: Medicare Other

## 2011-10-31 ENCOUNTER — Other Ambulatory Visit (HOSPITAL_BASED_OUTPATIENT_CLINIC_OR_DEPARTMENT_OTHER): Payer: Medicare Other | Admitting: Lab

## 2011-10-31 ENCOUNTER — Other Ambulatory Visit: Payer: Self-pay | Admitting: *Deleted

## 2011-10-31 VITALS — BP 96/50 | HR 83 | Temp 99.0°F | Resp 20

## 2011-10-31 DIAGNOSIS — Z5112 Encounter for antineoplastic immunotherapy: Secondary | ICD-10-CM

## 2011-10-31 DIAGNOSIS — D599 Acquired hemolytic anemia, unspecified: Secondary | ICD-10-CM

## 2011-10-31 DIAGNOSIS — C8307 Small cell B-cell lymphoma, spleen: Secondary | ICD-10-CM

## 2011-10-31 DIAGNOSIS — C8590 Non-Hodgkin lymphoma, unspecified, unspecified site: Secondary | ICD-10-CM

## 2011-10-31 DIAGNOSIS — C8589 Other specified types of non-Hodgkin lymphoma, extranodal and solid organ sites: Secondary | ICD-10-CM

## 2011-10-31 LAB — CBC WITH DIFFERENTIAL (CANCER CENTER ONLY)
BASO#: 0 10*3/uL (ref 0.0–0.2)
BASO%: 0.3 % (ref 0.0–2.0)
Eosinophils Absolute: 0.1 10*3/uL (ref 0.0–0.5)
HCT: 24.4 % — ABNORMAL LOW (ref 34.8–46.6)
HGB: 8.5 g/dL — ABNORMAL LOW (ref 11.6–15.9)
LYMPH%: 41.4 % (ref 14.0–48.0)
MCV: 107 fL — ABNORMAL HIGH (ref 81–101)
MONO#: 0.6 10*3/uL (ref 0.1–0.9)
NEUT%: 53.2 % (ref 39.6–80.0)
RDW: 23.4 % — ABNORMAL HIGH (ref 11.1–15.7)
WBC: 13.5 10*3/uL — ABNORMAL HIGH (ref 3.9–10.0)

## 2011-10-31 LAB — COMPREHENSIVE METABOLIC PANEL
ALT: 10 U/L (ref 0–35)
AST: 29 U/L (ref 0–37)
BUN: 16 mg/dL (ref 6–23)
CO2: 27 mEq/L (ref 19–32)
Creatinine, Ser: 0.6 mg/dL (ref 0.50–1.10)
Total Bilirubin: 4 mg/dL — ABNORMAL HIGH (ref 0.3–1.2)

## 2011-10-31 MED ORDER — ONDANSETRON 16 MG/50ML IVPB (CHCC)
16.0000 mg | Freq: Once | INTRAVENOUS | Status: AC
  Administered 2011-10-31: 16 mg via INTRAVENOUS

## 2011-10-31 MED ORDER — VINCRISTINE SULFATE CHEMO INJECTION 1 MG/ML
1.5000 mg | Freq: Once | INTRAVENOUS | Status: AC
  Administered 2011-10-31: 1.5 mg via INTRAVENOUS
  Filled 2011-10-31: qty 1.5

## 2011-10-31 MED ORDER — SODIUM CHLORIDE 0.9 % IV SOLN
800.0000 mg/m2 | Freq: Once | INTRAVENOUS | Status: AC
  Administered 2011-10-31: 1340 mg via INTRAVENOUS
  Filled 2011-10-31: qty 67

## 2011-10-31 MED ORDER — PREDNISONE 20 MG PO TABS: 80.0000 mg | ORAL_TABLET | Freq: Every day | ORAL | Status: AC

## 2011-10-31 MED ORDER — LORAZEPAM 0.5 MG PO TABS: 0.5000 mg | ORAL_TABLET | Freq: Four times a day (QID) | ORAL | Status: DC | PRN

## 2011-10-31 MED ORDER — FAMOTIDINE IN NACL 20-0.9 MG/50ML-% IV SOLN
20.0000 mg | Freq: Once | INTRAVENOUS | Status: AC
  Administered 2011-10-31: 20 mg via INTRAVENOUS

## 2011-10-31 MED ORDER — HEPARIN SOD (PORK) LOCK FLUSH 100 UNIT/ML IV SOLN
500.0000 [IU] | Freq: Once | INTRAVENOUS | Status: AC | PRN
  Administered 2011-10-31: 500 [IU]
  Filled 2011-10-31: qty 5

## 2011-10-31 MED ORDER — VINCRISTINE SULFATE CHEMO INJECTION 1 MG/ML: 1.5000 mg | Freq: Once | INTRAVENOUS | Status: DC

## 2011-10-31 MED ORDER — SODIUM CHLORIDE 0.9 % IV SOLN
Freq: Once | INTRAVENOUS | Status: AC
  Administered 2011-10-31: 10:00:00 via INTRAVENOUS

## 2011-10-31 MED ORDER — ONDANSETRON HCL 8 MG PO TABS: ORAL_TABLET | ORAL | Status: AC

## 2011-10-31 MED ORDER — ACETAMINOPHEN 325 MG PO TABS
650.0000 mg | ORAL_TABLET | Freq: Once | ORAL | Status: AC
  Administered 2011-10-31: 650 mg via ORAL

## 2011-10-31 MED ORDER — METHYLPREDNISOLONE SODIUM SUCC 125 MG IJ SOLR
125.0000 mg | Freq: Once | INTRAMUSCULAR | Status: AC
  Administered 2011-10-31: 125 mg via INTRAVENOUS

## 2011-10-31 MED ORDER — DIPHENHYDRAMINE HCL 25 MG PO CAPS
50.0000 mg | ORAL_CAPSULE | Freq: Once | ORAL | Status: AC
  Administered 2011-10-31: 50 mg via ORAL

## 2011-10-31 MED ORDER — LIDOCAINE-PRILOCAINE 2.5-2.5 % EX CREA: TOPICAL_CREAM | CUTANEOUS | Status: AC | PRN

## 2011-10-31 MED ORDER — SODIUM CHLORIDE 0.9 % IJ SOLN
10.0000 mL | INTRAMUSCULAR | Status: DC | PRN
  Administered 2011-10-31: 10 mL
  Filled 2011-10-31: qty 10

## 2011-10-31 MED ORDER — SODIUM CHLORIDE 0.9 % IV SOLN
375.0000 mg/m2 | Freq: Once | INTRAVENOUS | Status: AC
  Administered 2011-10-31: 600 mg via INTRAVENOUS
  Filled 2011-10-31: qty 60

## 2011-10-31 MED ORDER — PROCHLORPERAZINE MALEATE 10 MG PO TABS: 10.0000 mg | ORAL_TABLET | Freq: Four times a day (QID) | ORAL | Status: DC | PRN

## 2011-10-31 MED ORDER — DEXAMETHASONE SODIUM PHOSPHATE 4 MG/ML IJ SOLN
20.0000 mg | Freq: Once | INTRAMUSCULAR | Status: AC
  Administered 2011-10-31: 20 mg via INTRAVENOUS

## 2011-10-31 NOTE — Progress Notes (Signed)
Port cath access today for the first time. Good blood return noted. Right chest  Port-A-Cath is in place with its tip at  the cavoatrial junction.

## 2011-10-31 NOTE — Progress Notes (Signed)
At 1235 patient started experiencing chills.  Rituxan stopped at this point.  .9 NS started.  Solumedrol 125 mg given per dr. Myna Hidalgo order.  Dr. Lupita Leash here to assess patient.  Pepcid 20 mg given IVPB.  Patient O2 sat at 1240 was 83%.  O2 placed at 10 L N/C.  At 1251 O2 sat was 100%.  O2 decreased to 8 LNC.    1313 patient resting comfortably.  No chills or rigors noted.  VSS

## 2011-10-31 NOTE — Patient Instructions (Addendum)
[facility] Discharge Instructions for Patients Receiving Chemotherapy  Today you received the following chemotherapy agents Rituxan, Cytoxan, Vincristine,  Part of your chemotherapy regimen includes a steroid called Prednisone or Deltasone.  This is a pill to be taken at home.  Begin this today.  Take 4 tablets (80 mg total) by mouth once daily for 5 days after chemotherapy beginning 10/31/11.  After your dose on 11/04/11 you may stop this until the next cycle. To help prevent nausea and vomiting after your treatment, we encourage you to take your nausea medication  Take Ondansetron 8 mg (Zofran) one tablet twice daily.  Once in the morning and once in the evening (8 a and 8 p) beginning 9/10.  Take for 3 days after chemotherapy.  Can stop the Zofran after the dose on 11/03/11.  If still feeling nauseated can take Prochlorperazine 10 mg (Compazine) every 6 hours as needed for nausea.  Also, you can take Lorazepam (Ativan) .5 mg by mouth every 6 hours as needed for Nausea.     If you develop nausea and vomiting that is not controlled by your nausea medication, call the clinic [phone].  If it is after clinic hours your family physician or the after hours number for the clinic or go to the Emergency Department.   BELOW ARE SYMPTOMS THAT SHOULD BE REPORTED IMMEDIATELY:  *FEVER GREATER THAN 100.5 F  *CHILLS WITH OR WITHOUT FEVER  NAUSEA AND VOMITING THAT IS NOT CONTROLLED WITH YOUR NAUSEA MEDICATION  *UNUSUAL SHORTNESS OF BREATH  *UNUSUAL BRUISING OR BLEEDING  TENDERNESS IN MOUTH AND THROAT WITH OR WITHOUT PRESENCE OF ULCERS  *URINARY PROBLEMS  *BOWEL PROBLEMS  UNUSUAL RASH Items with * indicate a potential emergency and should be followed up as soon as possible.  One of the nurses will contact you 24 hours after your treatment. Please let the nurse know about any problems that you may have experienced. Feel free to call the clinic you have any questions or concerns. The clinic  phone number is [phone].   I have been informed and understand all the instructions given to me. I know to contact the clinic, my physician, or go to the Emergency Department if any problems should occur. I do not have any questions at this time, but understand that I may call the clinic during office hours   should I have any questions or need assistance in obtaining follow up care.    __________________________________________  _____________  __________ Signature of Patient or Authorized Representative            Date                   Time    __________________________________________ Nurse's Signature   Prednisone tablets What is this medicine? PREDNISONE (PRED ni sone) is a corticosteroid. It is commonly used to treat inflammation of the skin, joints, lungs, and other organs. Common conditions treated include asthma, allergies, and arthritis. It is also used for other conditions, such as blood disorders and diseases of the adrenal glands. This medicine may be used for other purposes; ask your health care provider or pharmacist if you have questions. What should I tell my health care provider before I take this medicine? They need to know if you have any of these conditions: -Cushing's syndrome -diabetes -glaucoma -heart disease -high blood pressure -infection (especially a virus infection such as chickenpox, cold sores, or herpes) -kidney disease -liver disease -mental illness -myasthenia gravis -osteoporosis -seizures -stomach or  intestine problems -thyroid disease -an unusual or allergic reaction to lactose, prednisone, other medicines, foods, dyes, or preservatives -pregnant or trying to get pregnant -breast-feeding How should I use this medicine? Take this medicine by mouth with a glass of water. Follow the directions on the prescription label. Take this medicine with food. If you are taking this medicine once a day, take it in the morning. Do not take more  medicine than you are told to take. Do not suddenly stop taking your medicine because you may develop a severe reaction. Your doctor will tell you how much medicine to take. If your doctor wants you to stop the medicine, the dose may be slowly lowered over time to avoid any side effects. Talk to your pediatrician regarding the use of this medicine in children. Special care may be needed. Overdosage: If you think you have taken too much of this medicine contact a poison control center or emergency room at once. NOTE: This medicine is only for you. Do not share this medicine with others. What if I miss a dose? If you miss a dose, take it as soon as you can. If it is almost time for your next dose, talk to your doctor or health care professional. You may need to miss a dose or take an extra dose. Do not take double or extra doses without advice. What may interact with this medicine? Do not take this medicine with any of the following medications: -metyrapone -mifepristone This medicine may also interact with the following medications: -aminoglutethimide -amphotericin B -aspirin and aspirin-like medicines -barbiturates -certain medicines for diabetes, like glipizide or glyburide -cholestyramine -cholinesterase inhibitors -cyclosporine -digoxin -diuretics -ephedrine -female hormones, like estrogens and birth control pills -isoniazid -ketoconazole -NSAIDS, medicines for pain and inflammation, like ibuprofen or naproxen -phenytoin -rifampin -toxoids -vaccines -warfarin This list may not describe all possible interactions. Give your health care provider a list of all the medicines, herbs, non-prescription drugs, or dietary supplements you use. Also tell them if you smoke, drink alcohol, or use illegal drugs. Some items may interact with your medicine. What should I watch for while using this medicine? Visit your doctor or health care professional for regular checks on your progress. If you  are taking this medicine over a prolonged period, carry an identification card with your name and address, the type and dose of your medicine, and your doctor's name and address. This medicine may increase your risk of getting an infection. Tell your doctor or health care professional if you are around anyone with measles or chickenpox, or if you develop sores or blisters that do not heal properly. If you are going to have surgery, tell your doctor or health care professional that you have taken this medicine within the last twelve months. Ask your doctor or health care professional about your diet. You may need to lower the amount of salt you eat. This medicine may affect blood sugar levels. If you have diabetes, check with your doctor or health care professional before you change your diet or the dose of your diabetic medicine. What side effects may I notice from receiving this medicine? Side effects that you should report to your doctor or health care professional as soon as possible: -allergic reactions like skin rash, itching or hives, swelling of the face, lips, or tongue -changes in emotions or moods -changes in vision -depressed mood -eye pain -fever or chills, cough, sore throat, pain or difficulty passing urine -increased thirst -swelling of ankles, feet Side effects that  usually do not require medical attention (report to your doctor or health care professional if they continue or are bothersome): -confusion, excitement, restlessness -headache -nausea, vomiting -skin problems, acne, thin and shiny skin -trouble sleeping -weight gain This list may not describe all possible side effects. Call your doctor for medical advice about side effects. You may report side effects to FDA at 1-800-FDA-1088. Where should I keep my medicine? Keep out of the reach of children. Store at room temperature between 15 and 30 degrees C (59 and 86 degrees F). Protect from light. Keep container tightly  closed. Throw away any unused medicine after the expiration date. NOTE: This sheet is a summary. It may not cover all possible information. If you have questions about this medicine, talk to your doctor, pharmacist, or health care provider.  2012, [employer] Standard. (09/23/2010 10:57:14 AM)Vincristine injection What is this medicine? VINCRISTINE (vin KRIS teen) is a chemotherapy drug. It slows the growth of cancer cells. This medicine is used to treat many types of cancer like Hodgkin's disease, leukemia, non-Hodgkin's lymphoma, neuroblastoma (brain cancer), rhabdomyosarcoma, and Wilms' tumor. This medicine may be used for other purposes; ask your health care provider or pharmacist if you have questions. What should I tell my health care provider before I take this medicine? They need to know if you have any of these conditions: -blood disorders -gout -infection (especially chickenpox, cold sores, or herpes) -kidney disease -liver disease -lung disease -nervous system disease like Charcot-Marie-Tooth (CMT) -recent or ongoing radiation therapy -an unusual or allergic reaction to vincristine, other chemotherapy agents, other medicines, foods, dyes, or preservatives -pregnant or trying to get pregnant -breast-feeding How should I use this medicine? This drug is given as an infusion into a vein. It is administered in a hospital or clinic by a specially trained health care professional. If you have pain, swelling, burning, or any unusual feeling around the site of your injection, tell your health care professional right away. Talk to your pediatrician regarding the use of this medicine in children. While this drug may be prescribed for selected conditions, precautions do apply. Overdosage: If you think you have taken too much of this medicine contact a poison control center or emergency room at once. NOTE: This medicine is only for you. Do not share this medicine with others. What if I miss a  dose? It is important not to miss your dose. Call your doctor or health care professional if you are unable to keep an appointment. What may interact with this medicine? Do not take this medicine with any of the following medications: -itraconazole -mibefradil -voriconazole This medicine may also interact with the following medications: -cyclosporine -erythromycin -fluconazole -ketoconazole -medicines for HIV like delavirdine, efavirenz, nevirapine -medicines for seizures like ethotoin, fosphenotoin, phenytoin -medicines to increase blood counts like filgrastim, pegfilgrastim, sargramostim -other chemotherapy drugs like cisplatin, L-asparaginase, methotrexate, mitomycin, paclitaxel -pegaspargase -vaccines -zalcitabine, ddC Talk to your doctor or health care professional before taking any of these medicines: -acetaminophen -aspirin -ibuprofen -ketoprofen -naproxen This list may not describe all possible interactions. Give your health care provider a list of all the medicines, herbs, non-prescription drugs, or dietary supplements you use. Also tell them if you smoke, drink alcohol, or use illegal drugs. Some items may interact with your medicine. What should I watch for while using this medicine? Your condition will be monitored carefully while you are receiving this medicine. You will need important blood work done while you are taking this medicine. This drug may make you feel generally  unwell. This is not uncommon, as chemotherapy can affect healthy cells as well as cancer cells. Report any side effects. Continue your course of treatment even though you feel ill unless your doctor tells you to stop. In some cases, you may be given additional medicines to help with side effects. Follow all directions for their use. Call your doctor or health care professional for advice if you get a fever, chills or sore throat, or other symptoms of a cold or flu. Do not treat yourself. Avoid taking  products that contain aspirin, acetaminophen, ibuprofen, naproxen, or ketoprofen unless instructed by your doctor. These medicines may hide a fever. Do not become pregnant while taking this medicine. Women should inform their doctor if they wish to become pregnant or think they might be pregnant. There is a potential for serious side effects to an unborn child. Talk to your health care professional or pharmacist for more information. Do not breast-feed an infant while taking this medicine. Men may have a lower sperm count while taking this medicine. Talk to your doctor if you plan to father a child. What side effects may I notice from receiving this medicine? Side effects that you should report to your doctor or health care professional as soon as possible: -allergic reactions like skin rash, itching or hives, swelling of the face, lips, or tongue -breathing problems -confusion or changes in emotions or moods -constipation -cough -mouth sores -muscle weakness -nausea and vomiting -pain, swelling, redness or irritation at the injection site -pain, tingling, numbness in the hands or feet -problems with balance, talking, walking -seizures -stomach pain -trouble passing urine or change in the amount of urine Side effects that usually do not require medical attention (report to your doctor or health care professional if they continue or are bothersome): -diarrhea -hair loss -jaw pain -loss of appetite This list may not describe all possible side effects. Call your doctor for medical advice about side effects. You may report side effects to FDA at 1-800-FDA-1088. Where should I keep my medicine? This drug is given in a hospital or clinic and will not be stored at home. NOTE: This sheet is a summary. It may not cover all possible information. If you have questions about this medicine, talk to your doctor, pharmacist, or health care provider.  2012, [employer] Standard. (11/05/2007 5:17:13  PM)Cyclophosphamide injection What is this medicine? CYCLOPHOSPHAMIDE (sye kloe FOSS fa mide) is a chemotherapy drug. It slows the growth of cancer cells. This medicine is used to treat many types of cancer like lymphoma, myeloma, leukemia, breast cancer, and ovarian cancer, to name a few. It is also used to treat nephrotic syndrome in children. This medicine may be used for other purposes; ask your health care provider or pharmacist if you have questions. What should I tell my health care provider before I take this medicine? They need to know if you have any of these conditions: -blood disorders -history of other chemotherapy -history of radiation therapy -infection -kidney disease -liver disease -tumors in the bone marrow -an unusual or allergic reaction to cyclophosphamide, other chemotherapy, other medicines, foods, dyes, or preservatives -pregnant or trying to get pregnant -breast-feeding How should I use this medicine? This drug is usually given as an injection into a vein or muscle or by infusion into a vein. It is administered in a hospital or clinic by a specially trained health care professional. Talk to your pediatrician regarding the use of this medicine in children. While this drug may be prescribed for  selected conditions, precautions do apply. Overdosage: If you think you have taken too much of this medicine contact a poison control center or emergency room at once. NOTE: This medicine is only for you. Do not share this medicine with others. What if I miss a dose? It is important not to miss your dose. Call your doctor or health care professional if you are unable to keep an appointment. What may interact with this medicine? Do not take this medicine with any of the following medications: -mibefradil -nalidixic acid This medicine may also interact with the following medications: -doxorubicin -etanercept -medicines to increase blood counts like filgrastim, pegfilgrastim,  sargramostim -medicines that block muscle or nerve pain -St. John's Wort -phenobarbital -succinylcholine chloride -trastuzumab -vaccines Talk to your doctor or health care professional before taking any of these medicines: -acetaminophen -aspirin -ibuprofen -ketoprofen -naproxen This list may not describe all possible interactions. Give your health care provider a list of all the medicines, herbs, non-prescription drugs, or dietary supplements you use. Also tell them if you smoke, drink alcohol, or use illegal drugs. Some items may interact with your medicine. What should I watch for while using this medicine? Visit your doctor for checks on your progress. This drug may make you feel generally unwell. This is not uncommon, as chemotherapy can affect healthy cells as well as cancer cells. Report any side effects. Continue your course of treatment even though you feel ill unless your doctor tells you to stop. Drink water or other fluids as directed. Urinate often, even at night. In some cases, you may be given additional medicines to help with side effects. Follow all directions for their use. Call your doctor or health care professional for advice if you get a fever, chills or sore throat, or other symptoms of a cold or flu. Do not treat yourself. This drug decreases your body's ability to fight infections. Try to avoid being around people who are sick. This medicine may increase your risk to bruise or bleed. Call your doctor or health care professional if you notice any unusual bleeding. Be careful brushing and flossing your teeth or using a toothpick because you may get an infection or bleed more easily. If you have any dental work done, tell your dentist you are receiving this medicine. Avoid taking products that contain aspirin, acetaminophen, ibuprofen, naproxen, or ketoprofen unless instructed by your doctor. These medicines may hide a fever. Do not become pregnant while taking this  medicine. Women should inform their doctor if they wish to become pregnant or think they might be pregnant. There is a potential for serious side effects to an unborn child. Talk to your health care professional or pharmacist for more information. Do not breast-feed an infant while taking this medicine. Men should inform their doctor if they wish to father a child. This medicine may lower sperm counts. If you are going to have surgery, tell your doctor or health care professional that you have taken this medicine. What side effects may I notice from receiving this medicine? Side effects that you should report to your doctor or health care professional as soon as possible: -allergic reactions like skin rash, itching or hives, swelling of the face, lips, or tongue -low blood counts - this medicine may decrease the number of white blood cells, red blood cells and platelets. You may be at increased risk for infections and bleeding. -signs of infection - fever or chills, cough, sore throat, pain or difficulty passing urine -signs of decreased platelets or  bleeding - bruising, pinpoint red spots on the skin, black, tarry stools, blood in the urine -signs of decreased red blood cells - unusually weak or tired, fainting spells, lightheadedness -breathing problems -dark urine -mouth sores -pain, swelling, redness at site where injected -swelling of the ankles, feet, hands -trouble passing urine or change in the amount of urine -weight gain -yellowing of the eyes or skin Side effects that usually do not require medical attention (report to your doctor or health care professional if they continue or are bothersome): -changes in nail or skin color -diarrhea -hair loss -loss of appetite -missed menstrual periods -nausea, vomiting -stomach pain This list may not describe all possible side effects. Call your doctor for medical advice about side effects. You may report side effects to FDA at  1-800-FDA-1088. Where should I keep my medicine? This drug is given in a hospital or clinic and will not be stored at home. NOTE: This sheet is a summary. It may not cover all possible information. If you have questions about this medicine, talk to your doctor, pharmacist, or health care provider.  2012, [employer]/Gold Standard. (05/15/2007 2:32:25 PM)Rituximab injection What is this medicine? RITUXIMAB (ri TUX i mab) is a monoclonal antibody. This medicine changes the way the body's immune system works. It is used commonly to treat non-Hodgkin's lymphoma and other conditions. In cancer cells, this drug targets a specific protein within cancer cells and stops the cancer cells from growing. It is also used to treat rhuematoid arthritis (RA). In RA, this medicine slow the inflammatory process and help reduce joint pain and swelling. This medicine is often used with other cancer or arthritis medications. This medicine may be used for other purposes; ask your health care provider or pharmacist if you have questions. What should I tell my health care provider before I take this medicine? They need to know if you have any of these conditions: -blood disorders -heart disease -history of hepatitis B -infection (especially a virus infection such as chickenpox, cold sores, or herpes) -irregular heartbeat -kidney disease -lung or breathing disease, like asthma -lupus -an unusual or allergic reaction to rituximab, mouse proteins, other medicines, foods, dyes, or preservatives -pregnant or trying to get pregnant -breast-feeding How should I use this medicine? This medicine is for infusion into a vein. It is administered in a hospital or clinic by a specially trained health care professional. A special MedGuide will be given to you by the pharmacist with each prescription and refill. Be sure to read this information carefully each time. Talk to your pediatrician regarding the use of this medicine in  children. This medicine is not approved for use in children. Overdosage: If you think you have taken too much of this medicine contact a poison control center or emergency room at once. NOTE: This medicine is only for you. Do not share this medicine with others. What if I miss a dose? It is important not to miss a dose. Call your doctor or health care professional if you are unable to keep an appointment. What may interact with this medicine? -cisplatin -medicines for blood pressure -some other medicines for arthritis -vaccines This list may not describe all possible interactions. Give your health care provider a list of all the medicines, herbs, non-prescription drugs, or dietary supplements you use. Also tell them if you smoke, drink alcohol, or use illegal drugs. Some items may interact with your medicine. What should I watch for while using this medicine? Report any side effects that you notice  during your treatment right away, such as changes in your breathing, fever, chills, dizziness or lightheadedness. These effects are more common with the first dose. Visit your prescriber or health care professional for checks on your progress. You will need to have regular blood work. Report any other side effects. The side effects of this medicine can continue after you finish your treatment. Continue your course of treatment even though you feel ill unless your doctor tells you to stop. Call your doctor or health care professional for advice if you get a fever, chills or sore throat, or other symptoms of a cold or flu. Do not treat yourself. This drug decreases your body's ability to fight infections. Try to avoid being around people who are sick. This medicine may increase your risk to bruise or bleed. Call your doctor or health care professional if you notice any unusual bleeding. Be careful brushing and flossing your teeth or using a toothpick because you may get an infection or bleed more easily. If  you have any dental work done, tell your dentist you are receiving this medicine. Avoid taking products that contain aspirin, acetaminophen, ibuprofen, naproxen, or ketoprofen unless instructed by your doctor. These medicines may hide a fever. Do not become pregnant while taking this medicine. Women should inform their doctor if they wish to become pregnant or think they might be pregnant. There is a potential for serious side effects to an unborn child. Talk to your health care professional or pharmacist for more information. Do not breast-feed an infant while taking this medicine. What side effects may I notice from receiving this medicine? Side effects that you should report to your doctor or health care professional as soon as possible: -allergic reactions like skin rash, itching or hives, swelling of the face, lips, or tongue -low blood counts - this medicine may decrease the number of white blood cells, red blood cells and platelets. You may be at increased risk for infections and bleeding. -signs of infection - fever or chills, cough, sore throat, pain or difficulty passing urine -signs of decreased platelets or bleeding - bruising, pinpoint red spots on the skin, black, tarry stools, blood in the urine -signs of decreased red blood cells - unusually weak or tired, fainting spells, lightheadedness -breathing problems -confused, not responsive -chest pain -fast, irregular heartbeat -feeling faint or lightheaded, falls -mouth sores -redness, blistering, peeling or loosening of the skin, including inside the mouth -stomach pain -swelling of the ankles, feet, or hands -trouble passing urine or change in the amount of urine Side effects that usually do not require medical attention (report to your doctor or other health care professional if they continue or are bothersome): -anxiety -headache -loss of appetite -muscle aches -nausea -night sweats This list may not describe all possible  side effects. Call your doctor for medical advice about side effects. You may report side effects to FDA at 1-800-FDA-1088. Where should I keep my medicine? This drug is given in a hospital or clinic and will not be stored at home. NOTE: This sheet is a summary. It may not cover all possible information. If you have questions about this medicine, talk to your doctor, pharmacist, or health care provider.  2012, [employer]. (10/08/2007 2:04:59 PM)Bortezomib injection What is this medicine? BORTEZOMIB (bor TEZ oh mib) is a chemotherapy drug. It slows the growth of cancer cells. This medicine is used to treat multiple myeloma, lymphoma, and other cancers. This medicine may be used for other purposes; ask your health  care provider or pharmacist if you have questions. What should I tell my health care provider before I take this medicine? They need to know if you have any of these conditions: -heart disease -irregular heartbeat -liver disease -low blood counts, like low white blood cells, platelets, or hemoglobin -peripheral neuropathy -taking medicine for blood pressure -an unusual or allergic reaction to bortezomib, mannitol, boron, other medicines, foods, dyes, or preservatives -pregnant or trying to get pregnant -breast-feeding How should I use this medicine? This medicine is for injection into a vein or for injection under the skin. It is given by a health care professional in a hospital or clinic setting. Talk to your pediatrician regarding the use of this medicine in children. Special care may be needed. Overdosage: If you think you have taken too much of this medicine contact a poison control center or emergency room at once. NOTE: This medicine is only for you. Do not share this medicine with others. What if I miss a dose? It is important not to miss your dose. Call your doctor or health care professional if you are unable to keep an appointment. What may interact with this  medicine? -medicines for diabetes -medicines to increase blood counts like filgrastim, pegfilgrastim, sargramostim -zalcitabine Talk to your doctor or health care professional before taking any of these medicines: -acetaminophen -aspirin -ibuprofen -ketoprofen -naproxen This list may not describe all possible interactions. Give your health care provider a list of all the medicines, herbs, non-prescription drugs, or dietary supplements you use. Also tell them if you smoke, drink alcohol, or use illegal drugs. Some items may interact with your medicine. What should I watch for while using this medicine? Visit your doctor for checks on your progress. This drug may make you feel generally unwell. This is not uncommon, as chemotherapy can affect healthy cells as well as cancer cells. Report any side effects. Continue your course of treatment even though you feel ill unless your doctor tells you to stop. You may get drowsy or dizzy. Do not drive, use machinery, or do anything that needs mental alertness until you know how this medicine affects you. Do not stand or sit up quickly, especially if you are an older patient. This reduces the risk of dizzy or fainting spells. In some cases, you may be given additional medicines to help with side effects. Follow all directions for their use. Call your doctor or health care professional for advice if you get a fever, chills or sore throat, or other symptoms of a cold or flu. Do not treat yourself. This drug decreases your body's ability to fight infections. Try to avoid being around people who are sick. This medicine may increase your risk to bruise or bleed. Call your doctor or health care professional if you notice any unusual bleeding. Be careful brushing and flossing your teeth or using a toothpick because you may get an infection or bleed more easily. If you have any dental work done, tell your dentist you are receiving this medicine. Avoid taking products  that contain aspirin, acetaminophen, ibuprofen, naproxen, or ketoprofen unless instructed by your doctor. These medicines may hide a fever. Do not become pregnant while taking this medicine. Women should inform their doctor if they wish to become pregnant or think they might be pregnant. There is a potential for serious side effects to an unborn child. Talk to your health care professional or pharmacist for more information. Do not breast-feed an infant while taking this medicine. You may have vomiting  or diarrhea while taking this medicine. Drink water or other fluids as directed. What side effects may I notice from receiving this medicine? Side effects that you should report to your doctor or health care professional as soon as possible: -allergic reactions like skin rash, itching or hives, swelling of the face, lips, or tongue -breathing problems -changes in hearing -changes in vision -fast, irregular heartbeat -feeling faint or lightheaded, falls -pain, tingling, numbness in the hands or feet -seizures -swelling of the ankles, feet, hands -unusual bleeding or bruising -unusually weak or tired -vomiting Side effects that usually do not require medical attention (report to your doctor or health care professional if they continue or are bothersome): -changes in emotions or moods -constipation -diarrhea -loss of appetite -headache -irritation at site where injected -nausea This list may not describe all possible side effects. Call your doctor for medical advice about side effects. You may report side effects to FDA at 1-800-FDA-1088. Where should I keep my medicine? This drug is given in a hospital or clinic and will not be stored at home. NOTE: This sheet is a summary. It may not cover all possible information. If you have questions about this medicine, talk to your doctor, pharmacist, or health care provider.  2012, [employer] Standard. (03/17/2010 11:42:36 AM)

## 2011-11-01 ENCOUNTER — Encounter: Payer: Self-pay | Admitting: Hematology & Oncology

## 2011-11-07 ENCOUNTER — Other Ambulatory Visit (HOSPITAL_BASED_OUTPATIENT_CLINIC_OR_DEPARTMENT_OTHER): Payer: Medicare Other | Admitting: Lab

## 2011-11-07 DIAGNOSIS — C8307 Small cell B-cell lymphoma, spleen: Secondary | ICD-10-CM

## 2011-11-07 DIAGNOSIS — C8590 Non-Hodgkin lymphoma, unspecified, unspecified site: Secondary | ICD-10-CM

## 2011-11-07 DIAGNOSIS — D599 Acquired hemolytic anemia, unspecified: Secondary | ICD-10-CM

## 2011-11-07 LAB — COMPREHENSIVE METABOLIC PANEL
ALT: 34 U/L (ref 0–35)
Albumin: 3.3 g/dL — ABNORMAL LOW (ref 3.5–5.2)
CO2: 31 mEq/L (ref 19–32)
Calcium: 8.5 mg/dL (ref 8.4–10.5)
Chloride: 100 mEq/L (ref 96–112)
Creatinine, Ser: 0.61 mg/dL (ref 0.50–1.10)

## 2011-11-07 LAB — CBC WITH DIFFERENTIAL (CANCER CENTER ONLY)
BASO#: 0 10*3/uL (ref 0.0–0.2)
BASO%: 0.3 % (ref 0.0–2.0)
HCT: 26.4 % — ABNORMAL LOW (ref 34.8–46.6)
HGB: 9.2 g/dL — ABNORMAL LOW (ref 11.6–15.9)
LYMPH#: 0.6 10*3/uL — ABNORMAL LOW (ref 0.9–3.3)
MONO#: 0.3 10*3/uL (ref 0.1–0.9)
NEUT%: 75.4 % (ref 39.6–80.0)
RDW: 18 % — ABNORMAL HIGH (ref 11.1–15.7)
WBC: 3.6 10*3/uL — ABNORMAL LOW (ref 3.9–10.0)

## 2011-11-07 LAB — LACTATE DEHYDROGENASE: LDH: 590 U/L — ABNORMAL HIGH (ref 94–250)

## 2011-11-14 ENCOUNTER — Other Ambulatory Visit (HOSPITAL_BASED_OUTPATIENT_CLINIC_OR_DEPARTMENT_OTHER): Payer: Medicare Other | Admitting: Lab

## 2011-11-14 DIAGNOSIS — D589 Hereditary hemolytic anemia, unspecified: Secondary | ICD-10-CM | POA: Insufficient documentation

## 2011-11-14 DIAGNOSIS — C8590 Non-Hodgkin lymphoma, unspecified, unspecified site: Secondary | ICD-10-CM

## 2011-11-14 DIAGNOSIS — D599 Acquired hemolytic anemia, unspecified: Secondary | ICD-10-CM

## 2011-11-14 DIAGNOSIS — C8307 Small cell B-cell lymphoma, spleen: Secondary | ICD-10-CM

## 2011-11-14 LAB — CBC WITH DIFFERENTIAL (CANCER CENTER ONLY)
BASO#: 0 10*3/uL (ref 0.0–0.2)
Eosinophils Absolute: 0 10*3/uL (ref 0.0–0.5)
HGB: 9.9 g/dL — ABNORMAL LOW (ref 11.6–15.9)
LYMPH%: 25.4 % (ref 14.0–48.0)
MCH: 30.8 pg (ref 26.0–34.0)
MCHC: 32.2 g/dL (ref 32.0–36.0)
MCV: 96 fL (ref 81–101)
MONO%: 14.5 % — ABNORMAL HIGH (ref 0.0–13.0)
NEUT%: 58 % (ref 39.6–80.0)
RBC: 3.21 10*6/uL — ABNORMAL LOW (ref 3.70–5.32)

## 2011-11-14 LAB — COMPREHENSIVE METABOLIC PANEL
Albumin: 3.6 g/dL (ref 3.5–5.2)
Alkaline Phosphatase: 71 U/L (ref 39–117)
BUN: 10 mg/dL (ref 6–23)
Creatinine, Ser: 0.67 mg/dL (ref 0.50–1.10)
Glucose, Bld: 123 mg/dL — ABNORMAL HIGH (ref 70–99)
Total Bilirubin: 1 mg/dL (ref 0.3–1.2)

## 2011-11-14 LAB — TECHNOLOGIST REVIEW CHCC SATELLITE

## 2011-11-18 ENCOUNTER — Encounter: Payer: Self-pay | Admitting: Pharmacist

## 2011-11-21 ENCOUNTER — Ambulatory Visit (HOSPITAL_BASED_OUTPATIENT_CLINIC_OR_DEPARTMENT_OTHER): Payer: Medicare Other | Admitting: Medical

## 2011-11-21 ENCOUNTER — Ambulatory Visit (HOSPITAL_BASED_OUTPATIENT_CLINIC_OR_DEPARTMENT_OTHER): Payer: Medicare Other

## 2011-11-21 ENCOUNTER — Other Ambulatory Visit: Payer: Self-pay | Admitting: Medical

## 2011-11-21 ENCOUNTER — Other Ambulatory Visit (HOSPITAL_BASED_OUTPATIENT_CLINIC_OR_DEPARTMENT_OTHER): Payer: Medicare Other | Admitting: Lab

## 2011-11-21 VITALS — BP 105/47 | HR 60 | Temp 97.7°F | Resp 18 | Ht 61.0 in | Wt 148.0 lb

## 2011-11-21 VITALS — BP 113/62 | HR 73 | Temp 97.9°F | Resp 18

## 2011-11-21 DIAGNOSIS — D599 Acquired hemolytic anemia, unspecified: Secondary | ICD-10-CM

## 2011-11-21 DIAGNOSIS — C8307 Small cell B-cell lymphoma, spleen: Secondary | ICD-10-CM

## 2011-11-21 DIAGNOSIS — C8589 Other specified types of non-Hodgkin lymphoma, extranodal and solid organ sites: Secondary | ICD-10-CM

## 2011-11-21 DIAGNOSIS — C8590 Non-Hodgkin lymphoma, unspecified, unspecified site: Secondary | ICD-10-CM

## 2011-11-21 DIAGNOSIS — Z5112 Encounter for antineoplastic immunotherapy: Secondary | ICD-10-CM

## 2011-11-21 LAB — CBC WITH DIFFERENTIAL (CANCER CENTER ONLY)
BASO%: 0.7 % (ref 0.0–2.0)
HCT: 36.4 % (ref 34.8–46.6)
LYMPH#: 0.6 10*3/uL — ABNORMAL LOW (ref 0.9–3.3)
MONO#: 0.3 10*3/uL (ref 0.1–0.9)
NEUT%: 67.7 % (ref 39.6–80.0)
RBC: 3.98 10*6/uL (ref 3.70–5.32)
RDW: 14.1 % (ref 11.1–15.7)
WBC: 3 10*3/uL — ABNORMAL LOW (ref 3.9–10.0)

## 2011-11-21 LAB — COMPREHENSIVE METABOLIC PANEL
ALT: 17 U/L (ref 0–35)
CO2: 27 mEq/L (ref 19–32)
Calcium: 8.8 mg/dL (ref 8.4–10.5)
Chloride: 104 mEq/L (ref 96–112)
Creatinine, Ser: 0.72 mg/dL (ref 0.50–1.10)
Sodium: 139 mEq/L (ref 135–145)
Total Protein: 6.2 g/dL (ref 6.0–8.3)

## 2011-11-21 MED ORDER — SODIUM CHLORIDE 0.9 % IV SOLN
Freq: Once | INTRAVENOUS | Status: AC
  Administered 2011-11-21: 10:00:00 via INTRAVENOUS

## 2011-11-21 MED ORDER — SODIUM CHLORIDE 0.9 % IV SOLN
375.0000 mg/m2 | Freq: Once | INTRAVENOUS | Status: AC
  Administered 2011-11-21: 600 mg via INTRAVENOUS
  Filled 2011-11-21: qty 60

## 2011-11-21 MED ORDER — VINCRISTINE SULFATE CHEMO INJECTION 1 MG/ML
1.5000 mg | Freq: Once | INTRAVENOUS | Status: AC
  Administered 2011-11-21: 1.5 mg via INTRAVENOUS
  Filled 2011-11-21: qty 1.5

## 2011-11-21 MED ORDER — HEPARIN SOD (PORK) LOCK FLUSH 100 UNIT/ML IV SOLN
500.0000 [IU] | Freq: Once | INTRAVENOUS | Status: AC | PRN
  Administered 2011-11-21: 500 [IU]
  Filled 2011-11-21: qty 5

## 2011-11-21 MED ORDER — SODIUM CHLORIDE 0.9 % IV SOLN
60.0000 mg | Freq: Once | INTRAVENOUS | Status: AC
  Administered 2011-11-21: 60 mg via INTRAVENOUS
  Filled 2011-11-21: qty 0.48

## 2011-11-21 MED ORDER — SODIUM CHLORIDE 0.9 % IV SOLN
800.0000 mg/m2 | Freq: Once | INTRAVENOUS | Status: AC
  Administered 2011-11-21: 1340 mg via INTRAVENOUS
  Filled 2011-11-21: qty 67

## 2011-11-21 MED ORDER — ONDANSETRON 16 MG/50ML IVPB (CHCC)
16.0000 mg | Freq: Once | INTRAVENOUS | Status: AC
  Administered 2011-11-21: 16 mg via INTRAVENOUS

## 2011-11-21 MED ORDER — ACETAMINOPHEN 325 MG PO TABS
650.0000 mg | ORAL_TABLET | Freq: Once | ORAL | Status: AC
  Administered 2011-11-21: 650 mg via ORAL

## 2011-11-21 MED ORDER — SODIUM CHLORIDE 0.9 % IJ SOLN
10.0000 mL | INTRAMUSCULAR | Status: DC | PRN
  Administered 2011-11-21: 10 mL
  Filled 2011-11-21: qty 10

## 2011-11-21 MED ORDER — DIPHENHYDRAMINE HCL 25 MG PO CAPS
50.0000 mg | ORAL_CAPSULE | Freq: Once | ORAL | Status: AC
  Administered 2011-11-21: 50 mg via ORAL

## 2011-11-21 MED ORDER — DEXAMETHASONE SODIUM PHOSPHATE 4 MG/ML IJ SOLN
20.0000 mg | Freq: Once | INTRAMUSCULAR | Status: AC
  Administered 2011-11-21: 20 mg via INTRAVENOUS

## 2011-11-21 NOTE — Progress Notes (Signed)
Diagnoses: #1 low-grade non-Hodgkin lymphoma. #2, severe hemolytic anemia, Coombs negative.  Current therapy: Status post 1 cycle of Rituxan-CVP (started on 10/31/2011).  Interim history: Kristine Sullivan presents today for an office followup visit.  Her son accompanies her.  She is status post 1 cycle of Rituxan-CVP back on 10/31/2011.  Overall, she tolerated it quite well without any problems.  She is here today and states, that this is the best.  She has felt in a long time.  She notices significant decrease in swelling around.  Her eyes.  She is excellent energy level.  She has a great.  Appetite.  She denies any nausea, vomiting, diarrhea, constipation, any chest pain, shortness of breath, cough.  She denies any fevers, chills, or night sweats.  She denies any headaches, visual changes, or rashes.  She denies any obvious, or abnormal bleeding or bruising.  She denies any type of abdominal pain.  Her hemoglobin is excellent at 12.0.  Again, I believe her hemolytic anemia was coming from her.  Her lymphoma.  She is Coombs negative.  Overall, she is doing quite well without any new problems or complaints.  She will go ahead with her second cycle today.  Review of Systems: Pt. Denies any changes in their vision, hearing, adenopathy, fevers, chills, nausea, vomiting, diarrhea, constipation, chest pain, shortness of breath, passing blood, passing out, blacking out,  any changes in skin, joints, neurologic or psychiatric except as noted.  Physical Exam:  this is a elderly 78 year old, African American, female, in no obvious distress Vitals: temperature 97.7 degrees, pulse 60, respirations 18, blood pressure 105/47, weight 148 pounds HEENT reveals a normocephalic, atraumatic skull, no scleral icterus, no oral lesions, , she still has some minimal swelling around.  Her right. Neck is supple without any cervical or supraclavicular adenopathy.  Lungs are clear to auscultation bilaterally. There are no wheezes,  rales or rhonci Cardiac is regular rate and rhythm with a normal S1 and S2. There are no murmurs, rubs, or bruits.  Abdomen is soft with good bowel sounds, there is no palpable mass. There is no palpable hepatosplenomegaly. There is no palpable fluid wave.  Musculoskeletal no tenderness of the spine, ribs, or hips.  Extremities there are no clubbing, cyanosis, , trace edema in her lower legs.  Skin no petechia, purpura or ecchymosis Neurologic is nonfocal.  Laboratory Data:  White count 3.0, hemoglobin 12.0, hematocrit 36.4, platelets 128,000  Current Outpatient Prescriptions on File Prior to Visit  Medication Sig Dispense Refill  . amLODipine (NORVASC) 10 MG tablet Take 10 mg by mouth at bedtime.       [name] aspirin 325 MG tablet Take 325 mg by mouth daily.      . danazol (DANOCRINE) 100 MG capsule Take 100 mg by mouth daily. For angioedema      . fish oil-omega-3 fatty acids 1000 MG capsule Take 1 g by mouth daily.       . folic acid (FOLVITE) 1 MG tablet Take 1 mg by mouth every morning.      . furosemide (LASIX) 40 MG tablet Take 40 mg by mouth daily.      . hydrOXYzine (ATARAX/VISTARIL) 25 MG tablet Take 25 mg by mouth at bedtime as needed. For itching      . lidocaine-prilocaine (EMLA) cream Apply topically as needed. Apply to portacath site 1-2 hours prior to chemotherapy.  30 g  0  . LORazepam (ATIVAN) 0.5 MG tablet Take 1 tablet (0.5 mg total) by mouth every  6 (six) hours as needed (Nausea or vomiting).  30 tablet  0  . lovastatin (MEVACOR) 40 MG tablet Take 40 mg by mouth at bedtime.       . metFORMIN (GLUCOPHAGE) 500 MG tablet Take 500 mg by mouth daily with breakfast.      . ondansetron (ZOFRAN) 8 MG tablet Take 1 tablet two times a day starting the day after chemo for 3 days. Then take 1 tablet two times a day as needed for nausea or vomiting.  30 tablet  1  . prochlorperazine (COMPAZINE) 10 MG tablet Take 1 tablet (10 mg total) by mouth every 6 (six) hours as needed (Nausea or  vomiting).  30 tablet  1  . timolol (TIMOPTIC) 0.5 % ophthalmic solution Place 1 drop into both eyes 2 (two) times daily.       . Travoprost, BAK Free, (TRAVATAN) 0.004 % SOLN ophthalmic solution Place 1 drop into both eyes at bedtime.      . triamterene-hydrochlorothiazide (DYAZIDE) 37.5-25 MG per capsule Take 1 capsule by mouth every morning.         Assessment/Plan: this is a pleasant, 78 year old, African American, female, with the following issues:  #1 low-grade non-Hodgkin lymphoma.  She is status post her first cycle of Rituxan-CVP back on 10/31/2011.   She is tolerated her chemotherapy quite well.  She feels, excellent.  So far, after one cycle.  She has had a nice response. We will go ahead with her second cycle today.  #2 hemolytic anemia -most likely secondary to #1.  [name]  She is having a nice response to her chemotherapy.  Her hemoglobin is 12.0.  Today.  Hematocrit is 36.4.  #3 followup.  We will see Kristine Sullivan back before.  Her third cycle, but before then should there be questions or concerns.

## 2011-11-21 NOTE — Patient Instructions (Signed)
[facility] Discharge Instructions for Patients Receiving Chemotherapy  Today you received the following chemotherapy agents Rituxan, Cytoxan, Vincristine,  Part of your chemotherapy regimen includes a steroid called Prednisone or Deltasone.  This is a pill to be taken at home.  Begin this today.  Take 4 tablets (80 mg total) by mouth once daily for 5 days after chemotherapy beginning 10/31/11.  After your dose on 11/04/11 you may stop this until the next cycle. To help prevent nausea and vomiting after your treatment, we encourage you to take your nausea medication  Take Ondansetron 8 mg (Zofran) one tablet twice daily.  Once in the morning and once in the evening (8 a and 8 p) beginning 9/10.  Take for 3 days after chemotherapy.  Can stop the Zofran after the dose on 11/03/11.  If still feeling nauseated can take Prochlorperazine 10 mg (Compazine) every 6 hours as needed for nausea.  Also, you can take Lorazepam (Ativan) .5 mg by mouth every 6 hours as needed for Nausea.     If you develop nausea and vomiting that is not controlled by your nausea medication, call the clinic [phone].  If it is after clinic hours your family physician or the after hours number for the clinic or go to the Emergency Department.   BELOW ARE SYMPTOMS THAT SHOULD BE REPORTED IMMEDIATELY:  *FEVER GREATER THAN 100.5 F  *CHILLS WITH OR WITHOUT FEVER  NAUSEA AND VOMITING THAT IS NOT CONTROLLED WITH YOUR NAUSEA MEDICATION  *UNUSUAL SHORTNESS OF BREATH  *UNUSUAL BRUISING OR BLEEDING  TENDERNESS IN MOUTH AND THROAT WITH OR WITHOUT PRESENCE OF ULCERS  *URINARY PROBLEMS  *BOWEL PROBLEMS  UNUSUAL RASH Items with * indicate a potential emergency and should be followed up as soon as possible.  One of the nurses will contact you 24 hours after your treatment. Please let the nurse know about any problems that you may have experienced. Feel free to call the clinic you have any questions or concerns. The clinic  phone number is [phone].   I have been informed and understand all the instructions given to me. I know to contact the clinic, my physician, or go to the Emergency Department if any problems should occur. I do not have any questions at this time, but understand that I may call the clinic during office hours   should I have any questions or need assistance in obtaining follow up care.    __________________________________________  _____________  __________ Signature of Patient or Authorized Representative            Date                   Time    __________________________________________ Nurse's Signature   Prednisone tablets What is this medicine? PREDNISONE (PRED ni sone) is a corticosteroid. It is commonly used to treat inflammation of the skin, joints, lungs, and other organs. Common conditions treated include asthma, allergies, and arthritis. It is also used for other conditions, such as blood disorders and diseases of the adrenal glands. This medicine may be used for other purposes; ask your health care provider or pharmacist if you have questions. What should I tell my health care provider before I take this medicine? They need to know if you have any of these conditions: -Cushing's syndrome -diabetes -glaucoma -heart disease -high blood pressure -infection (especially a virus infection such as chickenpox, cold sores, or herpes) -kidney disease -liver disease -mental illness -myasthenia gravis -osteoporosis -seizures -stomach or   intestine problems -thyroid disease -an unusual or allergic reaction to lactose, prednisone, other medicines, foods, dyes, or preservatives -pregnant or trying to get pregnant -breast-feeding How should I use this medicine? Take this medicine by mouth with a glass of water. Follow the directions on the prescription label. Take this medicine with food. If you are taking this medicine once a day, take it in the morning. Do not take more  medicine than you are told to take. Do not suddenly stop taking your medicine because you may develop a severe reaction. Your doctor will tell you how much medicine to take. If your doctor wants you to stop the medicine, the dose may be slowly lowered over time to avoid any side effects. Talk to your pediatrician regarding the use of this medicine in children. Special care may be needed. Overdosage: If you think you have taken too much of this medicine contact a poison control center or emergency room at once. NOTE: This medicine is only for you. Do not share this medicine with others. What if I miss a dose? If you miss a dose, take it as soon as you can. If it is almost time for your next dose, talk to your doctor or health care professional. You may need to miss a dose or take an extra dose. Do not take double or extra doses without advice. What may interact with this medicine? Do not take this medicine with any of the following medications: -metyrapone -mifepristone This medicine may also interact with the following medications: -aminoglutethimide -amphotericin B -aspirin and aspirin-like medicines -barbiturates -certain medicines for diabetes, like glipizide or glyburide -cholestyramine -cholinesterase inhibitors -cyclosporine -digoxin -diuretics -ephedrine -female hormones, like estrogens and birth control pills -isoniazid -ketoconazole -NSAIDS, medicines for pain and inflammation, like ibuprofen or naproxen -phenytoin -rifampin -toxoids -vaccines -warfarin This list may not describe all possible interactions. Give your health care provider a list of all the medicines, herbs, non-prescription drugs, or dietary supplements you use. Also tell them if you smoke, drink alcohol, or use illegal drugs. Some items may interact with your medicine. What should I watch for while using this medicine? Visit your doctor or health care professional for regular checks on your progress. If you  are taking this medicine over a prolonged period, carry an identification card with your name and address, the type and dose of your medicine, and your doctor's name and address. This medicine may increase your risk of getting an infection. Tell your doctor or health care professional if you are around anyone with measles or chickenpox, or if you develop sores or blisters that do not heal properly. If you are going to have surgery, tell your doctor or health care professional that you have taken this medicine within the last twelve months. Ask your doctor or health care professional about your diet. You may need to lower the amount of salt you eat. This medicine may affect blood sugar levels. If you have diabetes, check with your doctor or health care professional before you change your diet or the dose of your diabetic medicine. What side effects may I notice from receiving this medicine? Side effects that you should report to your doctor or health care professional as soon as possible: -allergic reactions like skin rash, itching or hives, swelling of the face, lips, or tongue -changes in emotions or moods -changes in vision -depressed mood -eye pain -fever or chills, cough, sore throat, pain or difficulty passing urine -increased thirst -swelling of ankles, feet Side effects that   usually do not require medical attention (report to your doctor or health care professional if they continue or are bothersome): -confusion, excitement, restlessness -headache -nausea, vomiting -skin problems, acne, thin and shiny skin -trouble sleeping -weight gain This list may not describe all possible side effects. Call your doctor for medical advice about side effects. You may report side effects to FDA at 1-800-FDA-1088. Where should I keep my medicine? Keep out of the reach of children. Store at room temperature between 15 and 30 degrees C (59 and 86 degrees F). Protect from light. Keep container tightly  closed. Throw away any unused medicine after the expiration date. NOTE: This sheet is a summary. It may not cover all possible information. If you have questions about this medicine, talk to your doctor, pharmacist, or health care provider.  2012, [employer] Standard. (09/23/2010 10:57:14 AM)Vincristine injection What is this medicine? VINCRISTINE (vin KRIS teen) is a chemotherapy drug. It slows the growth of cancer cells. This medicine is used to treat many types of cancer like Hodgkin's disease, leukemia, non-Hodgkin's lymphoma, neuroblastoma (brain cancer), rhabdomyosarcoma, and Wilms' tumor. This medicine may be used for other purposes; ask your health care provider or pharmacist if you have questions. What should I tell my health care provider before I take this medicine? They need to know if you have any of these conditions: -blood disorders -gout -infection (especially chickenpox, cold sores, or herpes) -kidney disease -liver disease -lung disease -nervous system disease like Charcot-Marie-Tooth (CMT) -recent or ongoing radiation therapy -an unusual or allergic reaction to vincristine, other chemotherapy agents, other medicines, foods, dyes, or preservatives -pregnant or trying to get pregnant -breast-feeding How should I use this medicine? This drug is given as an infusion into a vein. It is administered in a hospital or clinic by a specially trained health care professional. If you have pain, swelling, burning, or any unusual feeling around the site of your injection, tell your health care professional right away. Talk to your pediatrician regarding the use of this medicine in children. While this drug may be prescribed for selected conditions, precautions do apply. Overdosage: If you think you have taken too much of this medicine contact a poison control center or emergency room at once. NOTE: This medicine is only for you. Do not share this medicine with others. What if I miss a  dose? It is important not to miss your dose. Call your doctor or health care professional if you are unable to keep an appointment. What may interact with this medicine? Do not take this medicine with any of the following medications: -itraconazole -mibefradil -voriconazole This medicine may also interact with the following medications: -cyclosporine -erythromycin -fluconazole -ketoconazole -medicines for HIV like delavirdine, efavirenz, nevirapine -medicines for seizures like ethotoin, fosphenotoin, phenytoin -medicines to increase blood counts like filgrastim, pegfilgrastim, sargramostim -other chemotherapy drugs like cisplatin, L-asparaginase, methotrexate, mitomycin, paclitaxel -pegaspargase -vaccines -zalcitabine, ddC Talk to your doctor or health care professional before taking any of these medicines: -acetaminophen -aspirin -ibuprofen -ketoprofen -naproxen This list may not describe all possible interactions. Give your health care provider a list of all the medicines, herbs, non-prescription drugs, or dietary supplements you use. Also tell them if you smoke, drink alcohol, or use illegal drugs. Some items may interact with your medicine. What should I watch for while using this medicine? Your condition will be monitored carefully while you are receiving this medicine. You will need important blood work done while you are taking this medicine. This drug may make you feel generally   unwell. This is not uncommon, as chemotherapy can affect healthy cells as well as cancer cells. Report any side effects. Continue your course of treatment even though you feel ill unless your doctor tells you to stop. In some cases, you may be given additional medicines to help with side effects. Follow all directions for their use. Call your doctor or health care professional for advice if you get a fever, chills or sore throat, or other symptoms of a cold or flu. Do not treat yourself. Avoid taking  products that contain aspirin, acetaminophen, ibuprofen, naproxen, or ketoprofen unless instructed by your doctor. These medicines may hide a fever. Do not become pregnant while taking this medicine. Women should inform their doctor if they wish to become pregnant or think they might be pregnant. There is a potential for serious side effects to an unborn child. Talk to your health care professional or pharmacist for more information. Do not breast-feed an infant while taking this medicine. Men may have a lower sperm count while taking this medicine. Talk to your doctor if you plan to father a child. What side effects may I notice from receiving this medicine? Side effects that you should report to your doctor or health care professional as soon as possible: -allergic reactions like skin rash, itching or hives, swelling of the face, lips, or tongue -breathing problems -confusion or changes in emotions or moods -constipation -cough -mouth sores -muscle weakness -nausea and vomiting -pain, swelling, redness or irritation at the injection site -pain, tingling, numbness in the hands or feet -problems with balance, talking, walking -seizures -stomach pain -trouble passing urine or change in the amount of urine Side effects that usually do not require medical attention (report to your doctor or health care professional if they continue or are bothersome): -diarrhea -hair loss -jaw pain -loss of appetite This list may not describe all possible side effects. Call your doctor for medical advice about side effects. You may report side effects to FDA at 1-800-FDA-1088. Where should I keep my medicine? This drug is given in a hospital or clinic and will not be stored at home. NOTE: This sheet is a summary. It may not cover all possible information. If you have questions about this medicine, talk to your doctor, pharmacist, or health care provider.  2012, [employer]/Gold Standard. (11/05/2007 5:17:13  PM)Cyclophosphamide injection What is this medicine? CYCLOPHOSPHAMIDE (sye kloe FOSS fa mide) is a chemotherapy drug. It slows the growth of cancer cells. This medicine is used to treat many types of cancer like lymphoma, myeloma, leukemia, breast cancer, and ovarian cancer, to name a few. It is also used to treat nephrotic syndrome in children. This medicine may be used for other purposes; ask your health care provider or pharmacist if you have questions. What should I tell my health care provider before I take this medicine? They need to know if you have any of these conditions: -blood disorders -history of other chemotherapy -history of radiation therapy -infection -kidney disease -liver disease -tumors in the bone marrow -an unusual or allergic reaction to cyclophosphamide, other chemotherapy, other medicines, foods, dyes, or preservatives -pregnant or trying to get pregnant -breast-feeding How should I use this medicine? This drug is usually given as an injection into a vein or muscle or by infusion into a vein. It is administered in a hospital or clinic by a specially trained health care professional. Talk to your pediatrician regarding the use of this medicine in children. While this drug may be prescribed for   selected conditions, precautions do apply. Overdosage: If you think you have taken too much of this medicine contact a poison control center or emergency room at once. NOTE: This medicine is only for you. Do not share this medicine with others. What if I miss a dose? It is important not to miss your dose. Call your doctor or health care professional if you are unable to keep an appointment. What may interact with this medicine? Do not take this medicine with any of the following medications: -mibefradil -nalidixic acid This medicine may also interact with the following medications: -doxorubicin -etanercept -medicines to increase blood counts like filgrastim, pegfilgrastim,  sargramostim -medicines that block muscle or nerve pain -St. John's Wort -phenobarbital -succinylcholine chloride -trastuzumab -vaccines Talk to your doctor or health care professional before taking any of these medicines: -acetaminophen -aspirin -ibuprofen -ketoprofen -naproxen This list may not describe all possible interactions. Give your health care provider a list of all the medicines, herbs, non-prescription drugs, or dietary supplements you use. Also tell them if you smoke, drink alcohol, or use illegal drugs. Some items may interact with your medicine. What should I watch for while using this medicine? Visit your doctor for checks on your progress. This drug may make you feel generally unwell. This is not uncommon, as chemotherapy can affect healthy cells as well as cancer cells. Report any side effects. Continue your course of treatment even though you feel ill unless your doctor tells you to stop. Drink water or other fluids as directed. Urinate often, even at night. In some cases, you may be given additional medicines to help with side effects. Follow all directions for their use. Call your doctor or health care professional for advice if you get a fever, chills or sore throat, or other symptoms of a cold or flu. Do not treat yourself. This drug decreases your body's ability to fight infections. Try to avoid being around people who are sick. This medicine may increase your risk to bruise or bleed. Call your doctor or health care professional if you notice any unusual bleeding. Be careful brushing and flossing your teeth or using a toothpick because you may get an infection or bleed more easily. If you have any dental work done, tell your dentist you are receiving this medicine. Avoid taking products that contain aspirin, acetaminophen, ibuprofen, naproxen, or ketoprofen unless instructed by your doctor. These medicines may hide a fever. Do not become pregnant while taking this  medicine. Women should inform their doctor if they wish to become pregnant or think they might be pregnant. There is a potential for serious side effects to an unborn child. Talk to your health care professional or pharmacist for more information. Do not breast-feed an infant while taking this medicine. Men should inform their doctor if they wish to father a child. This medicine may lower sperm counts. If you are going to have surgery, tell your doctor or health care professional that you have taken this medicine. What side effects may I notice from receiving this medicine? Side effects that you should report to your doctor or health care professional as soon as possible: -allergic reactions like skin rash, itching or hives, swelling of the face, lips, or tongue -low blood counts - this medicine may decrease the number of white blood cells, red blood cells and platelets. You may be at increased risk for infections and bleeding. -signs of infection - fever or chills, cough, sore throat, pain or difficulty passing urine -signs of decreased platelets or   bleeding - bruising, pinpoint red spots on the skin, black, tarry stools, blood in the urine -signs of decreased red blood cells - unusually weak or tired, fainting spells, lightheadedness -breathing problems -dark urine -mouth sores -pain, swelling, redness at site where injected -swelling of the ankles, feet, hands -trouble passing urine or change in the amount of urine -weight gain -yellowing of the eyes or skin Side effects that usually do not require medical attention (report to your doctor or health care professional if they continue or are bothersome): -changes in nail or skin color -diarrhea -hair loss -loss of appetite -missed menstrual periods -nausea, vomiting -stomach pain This list may not describe all possible side effects. Call your doctor for medical advice about side effects. You may report side effects to FDA at  1-800-FDA-1088. Where should I keep my medicine? This drug is given in a hospital or clinic and will not be stored at home. NOTE: This sheet is a summary. It may not cover all possible information. If you have questions about this medicine, talk to your doctor, pharmacist, or health care provider.  2012, [employer]/Gold Standard. (05/15/2007 2:32:25 PM)Rituximab injection What is this medicine? RITUXIMAB (ri TUX i mab) is a monoclonal antibody. This medicine changes the way the body's immune system works. It is used commonly to treat non-Hodgkin's lymphoma and other conditions. In cancer cells, this drug targets a specific protein within cancer cells and stops the cancer cells from growing. It is also used to treat rhuematoid arthritis (RA). In RA, this medicine slow the inflammatory process and help reduce joint pain and swelling. This medicine is often used with other cancer or arthritis medications. This medicine may be used for other purposes; ask your health care provider or pharmacist if you have questions. What should I tell my health care provider before I take this medicine? They need to know if you have any of these conditions: -blood disorders -heart disease -history of hepatitis B -infection (especially a virus infection such as chickenpox, cold sores, or herpes) -irregular heartbeat -kidney disease -lung or breathing disease, like asthma -lupus -an unusual or allergic reaction to rituximab, mouse proteins, other medicines, foods, dyes, or preservatives -pregnant or trying to get pregnant -breast-feeding How should I use this medicine? This medicine is for infusion into a vein. It is administered in a hospital or clinic by a specially trained health care professional. A special MedGuide will be given to you by the pharmacist with each prescription and refill. Be sure to read this information carefully each time. Talk to your pediatrician regarding the use of this medicine in  children. This medicine is not approved for use in children. Overdosage: If you think you have taken too much of this medicine contact a poison control center or emergency room at once. NOTE: This medicine is only for you. Do not share this medicine with others. What if I miss a dose? It is important not to miss a dose. Call your doctor or health care professional if you are unable to keep an appointment. What may interact with this medicine? -cisplatin -medicines for blood pressure -some other medicines for arthritis -vaccines This list may not describe all possible interactions. Give your health care provider a list of all the medicines, herbs, non-prescription drugs, or dietary supplements you use. Also tell them if you smoke, drink alcohol, or use illegal drugs. Some items may interact with your medicine. What should I watch for while using this medicine? Report any side effects that you notice   during your treatment right away, such as changes in your breathing, fever, chills, dizziness or lightheadedness. These effects are more common with the first dose. Visit your prescriber or health care professional for checks on your progress. You will need to have regular blood work. Report any other side effects. The side effects of this medicine can continue after you finish your treatment. Continue your course of treatment even though you feel ill unless your doctor tells you to stop. Call your doctor or health care professional for advice if you get a fever, chills or sore throat, or other symptoms of a cold or flu. Do not treat yourself. This drug decreases your body's ability to fight infections. Try to avoid being around people who are sick. This medicine may increase your risk to bruise or bleed. Call your doctor or health care professional if you notice any unusual bleeding. Be careful brushing and flossing your teeth or using a toothpick because you may get an infection or bleed more easily. If  you have any dental work done, tell your dentist you are receiving this medicine. Avoid taking products that contain aspirin, acetaminophen, ibuprofen, naproxen, or ketoprofen unless instructed by your doctor. These medicines may hide a fever. Do not become pregnant while taking this medicine. Women should inform their doctor if they wish to become pregnant or think they might be pregnant. There is a potential for serious side effects to an unborn child. Talk to your health care professional or pharmacist for more information. Do not breast-feed an infant while taking this medicine. What side effects may I notice from receiving this medicine? Side effects that you should report to your doctor or health care professional as soon as possible: -allergic reactions like skin rash, itching or hives, swelling of the face, lips, or tongue -low blood counts - this medicine may decrease the number of white blood cells, red blood cells and platelets. You may be at increased risk for infections and bleeding. -signs of infection - fever or chills, cough, sore throat, pain or difficulty passing urine -signs of decreased platelets or bleeding - bruising, pinpoint red spots on the skin, black, tarry stools, blood in the urine -signs of decreased red blood cells - unusually weak or tired, fainting spells, lightheadedness -breathing problems -confused, not responsive -chest pain -fast, irregular heartbeat -feeling faint or lightheaded, falls -mouth sores -redness, blistering, peeling or loosening of the skin, including inside the mouth -stomach pain -swelling of the ankles, feet, or hands -trouble passing urine or change in the amount of urine Side effects that usually do not require medical attention (report to your doctor or other health care professional if they continue or are bothersome): -anxiety -headache -loss of appetite -muscle aches -nausea -night sweats This list may not describe all possible  side effects. Call your doctor for medical advice about side effects. You may report side effects to FDA at 1-800-FDA-1088. Where should I keep my medicine? This drug is given in a hospital or clinic and will not be stored at home. NOTE: This sheet is a summary. It may not cover all possible information. If you have questions about this medicine, talk to your doctor, pharmacist, or health care provider.  2012, [employer]. (10/08/2007 2:04:59 PM)Bortezomib injection What is this medicine? BORTEZOMIB (bor TEZ oh mib) is a chemotherapy drug. It slows the growth of cancer cells. This medicine is used to treat multiple myeloma, lymphoma, and other cancers. This medicine may be used for other purposes; ask your health   care provider or pharmacist if you have questions. What should I tell my health care provider before I take this medicine? They need to know if you have any of these conditions: -heart disease -irregular heartbeat -liver disease -low blood counts, like low white blood cells, platelets, or hemoglobin -peripheral neuropathy -taking medicine for blood pressure -an unusual or allergic reaction to bortezomib, mannitol, boron, other medicines, foods, dyes, or preservatives -pregnant or trying to get pregnant -breast-feeding How should I use this medicine? This medicine is for injection into a vein or for injection under the skin. It is given by a health care professional in a hospital or clinic setting. Talk to your pediatrician regarding the use of this medicine in children. Special care may be needed. Overdosage: If you think you have taken too much of this medicine contact a poison control center or emergency room at once. NOTE: This medicine is only for you. Do not share this medicine with others. What if I miss a dose? It is important not to miss your dose. Call your doctor or health care professional if you are unable to keep an appointment. What may interact with this  medicine? -medicines for diabetes -medicines to increase blood counts like filgrastim, pegfilgrastim, sargramostim -zalcitabine Talk to your doctor or health care professional before taking any of these medicines: -acetaminophen -aspirin -ibuprofen -ketoprofen -naproxen This list may not describe all possible interactions. Give your health care provider a list of all the medicines, herbs, non-prescription drugs, or dietary supplements you use. Also tell them if you smoke, drink alcohol, or use illegal drugs. Some items may interact with your medicine. What should I watch for while using this medicine? Visit your doctor for checks on your progress. This drug may make you feel generally unwell. This is not uncommon, as chemotherapy can affect healthy cells as well as cancer cells. Report any side effects. Continue your course of treatment even though you feel ill unless your doctor tells you to stop. You may get drowsy or dizzy. Do not drive, use machinery, or do anything that needs mental alertness until you know how this medicine affects you. Do not stand or sit up quickly, especially if you are an older patient. This reduces the risk of dizzy or fainting spells. In some cases, you may be given additional medicines to help with side effects. Follow all directions for their use. Call your doctor or health care professional for advice if you get a fever, chills or sore throat, or other symptoms of a cold or flu. Do not treat yourself. This drug decreases your body's ability to fight infections. Try to avoid being around people who are sick. This medicine may increase your risk to bruise or bleed. Call your doctor or health care professional if you notice any unusual bleeding. Be careful brushing and flossing your teeth or using a toothpick because you may get an infection or bleed more easily. If you have any dental work done, tell your dentist you are receiving this medicine. Avoid taking products  that contain aspirin, acetaminophen, ibuprofen, naproxen, or ketoprofen unless instructed by your doctor. These medicines may hide a fever. Do not become pregnant while taking this medicine. Women should inform their doctor if they wish to become pregnant or think they might be pregnant. There is a potential for serious side effects to an unborn child. Talk to your health care professional or pharmacist for more information. Do not breast-feed an infant while taking this medicine. You may have vomiting   or diarrhea while taking this medicine. Drink water or other fluids as directed. What side effects may I notice from receiving this medicine? Side effects that you should report to your doctor or health care professional as soon as possible: -allergic reactions like skin rash, itching or hives, swelling of the face, lips, or tongue -breathing problems -changes in hearing -changes in vision -fast, irregular heartbeat -feeling faint or lightheaded, falls -pain, tingling, numbness in the hands or feet -seizures -swelling of the ankles, feet, hands -unusual bleeding or bruising -unusually weak or tired -vomiting Side effects that usually do not require medical attention (report to your doctor or health care professional if they continue or are bothersome): -changes in emotions or moods -constipation -diarrhea -loss of appetite -headache -irritation at site where injected -nausea This list may not describe all possible side effects. Call your doctor for medical advice about side effects. You may report side effects to FDA at 1-800-FDA-1088. Where should I keep my medicine? This drug is given in a hospital or clinic and will not be stored at home. NOTE: This sheet is a summary. It may not cover all possible information. If you have questions about this medicine, talk to your doctor, pharmacist, or health care provider.  2012, [employer] Standard. (03/17/2010 11:42:36 AM) 

## 2011-11-22 ENCOUNTER — Encounter (HOSPITAL_COMMUNITY)
Admission: RE | Admit: 2011-11-22 | Discharge: 2011-11-22 | Disposition: A | Discharge status: Home / Self Care | Payer: Medicare Other | Source: Ambulatory Visit | Attending: Hematology & Oncology | Admitting: Hematology & Oncology

## 2011-11-22 DIAGNOSIS — D599 Acquired hemolytic anemia, unspecified: Secondary | ICD-10-CM | POA: Insufficient documentation

## 2011-11-28 ENCOUNTER — Other Ambulatory Visit (HOSPITAL_BASED_OUTPATIENT_CLINIC_OR_DEPARTMENT_OTHER): Payer: Medicare Other | Admitting: Lab

## 2011-11-28 DIAGNOSIS — C8589 Other specified types of non-Hodgkin lymphoma, extranodal and solid organ sites: Secondary | ICD-10-CM

## 2011-11-28 LAB — CBC WITH DIFFERENTIAL (CANCER CENTER ONLY)
BASO#: 0 10*3/uL (ref 0.0–0.2)
Eosinophils Absolute: 0 10*3/uL (ref 0.0–0.5)
HCT: 33.2 % — ABNORMAL LOW (ref 34.8–46.6)
HGB: 11.3 g/dL — ABNORMAL LOW (ref 11.6–15.9)
MCH: 29.7 pg (ref 26.0–34.0)
MONO%: 10.5 % (ref 0.0–13.0)
NEUT#: 1.5 10*3/uL (ref 1.5–6.5)
NEUT%: 76.5 % (ref 39.6–80.0)
RBC: 3.8 10*6/uL (ref 3.70–5.32)

## 2011-11-28 LAB — COMPREHENSIVE METABOLIC PANEL
Albumin: 4 g/dL (ref 3.5–5.2)
Alkaline Phosphatase: 100 U/L (ref 39–117)
BUN: 12 mg/dL (ref 6–23)
Calcium: 8.8 mg/dL (ref 8.4–10.5)
Chloride: 100 mEq/L (ref 96–112)
Creatinine, Ser: 0.64 mg/dL (ref 0.50–1.10)
Glucose, Bld: 103 mg/dL — ABNORMAL HIGH (ref 70–99)
Potassium: 3.5 mEq/L (ref 3.5–5.3)

## 2011-12-05 ENCOUNTER — Other Ambulatory Visit (HOSPITAL_BASED_OUTPATIENT_CLINIC_OR_DEPARTMENT_OTHER): Payer: Medicare Other | Admitting: Lab

## 2011-12-05 DIAGNOSIS — C8307 Small cell B-cell lymphoma, spleen: Secondary | ICD-10-CM

## 2011-12-05 LAB — CBC WITH DIFFERENTIAL (CANCER CENTER ONLY)
BASO#: 0 10*3/uL (ref 0.0–0.2)
Eosinophils Absolute: 0.1 10*3/uL (ref 0.0–0.5)
HCT: 35 % (ref 34.8–46.6)
LYMPH%: 11.1 % — ABNORMAL LOW (ref 14.0–48.0)
MCH: 29.1 pg (ref 26.0–34.0)
MCV: 86 fL (ref 81–101)
MONO#: 0.4 10*3/uL (ref 0.1–0.9)
MONO%: 16.3 % — ABNORMAL HIGH (ref 0.0–13.0)
NEUT%: 68.2 % (ref 39.6–80.0)
Platelets: 103 10*3/uL — ABNORMAL LOW (ref 145–400)
RBC: 4.05 10*6/uL (ref 3.70–5.32)
WBC: 2.5 10*3/uL — ABNORMAL LOW (ref 3.9–10.0)

## 2011-12-05 LAB — COMPREHENSIVE METABOLIC PANEL
Alkaline Phosphatase: 110 U/L (ref 39–117)
BUN: 10 mg/dL (ref 6–23)
CO2: 29 mEq/L (ref 19–32)
Creatinine, Ser: 0.72 mg/dL (ref 0.50–1.10)
Glucose, Bld: 111 mg/dL — ABNORMAL HIGH (ref 70–99)
Sodium: 141 mEq/L (ref 135–145)
Total Bilirubin: 0.6 mg/dL (ref 0.3–1.2)
Total Protein: 5.8 g/dL — ABNORMAL LOW (ref 6.0–8.3)

## 2011-12-10 ENCOUNTER — Other Ambulatory Visit: Payer: Self-pay | Admitting: Hematology & Oncology

## 2011-12-10 DIAGNOSIS — C8307 Small cell B-cell lymphoma, spleen: Secondary | ICD-10-CM

## 2011-12-12 ENCOUNTER — Other Ambulatory Visit (HOSPITAL_BASED_OUTPATIENT_CLINIC_OR_DEPARTMENT_OTHER): Payer: Medicare Other | Admitting: Lab

## 2011-12-12 ENCOUNTER — Ambulatory Visit (HOSPITAL_BASED_OUTPATIENT_CLINIC_OR_DEPARTMENT_OTHER): Payer: Medicare Other | Admitting: Medical

## 2011-12-12 ENCOUNTER — Ambulatory Visit (HOSPITAL_BASED_OUTPATIENT_CLINIC_OR_DEPARTMENT_OTHER): Payer: Medicare Other

## 2011-12-12 VITALS — BP 122/71 | HR 75 | Temp 98.4°F | Resp 20

## 2011-12-12 VITALS — BP 117/60 | HR 65 | Temp 97.5°F | Resp 16 | Ht 61.0 in | Wt 150.0 lb

## 2011-12-12 DIAGNOSIS — C8307 Small cell B-cell lymphoma, spleen: Secondary | ICD-10-CM

## 2011-12-12 DIAGNOSIS — Z5112 Encounter for antineoplastic immunotherapy: Secondary | ICD-10-CM

## 2011-12-12 DIAGNOSIS — C8589 Other specified types of non-Hodgkin lymphoma, extranodal and solid organ sites: Secondary | ICD-10-CM

## 2011-12-12 DIAGNOSIS — Z5111 Encounter for antineoplastic chemotherapy: Secondary | ICD-10-CM

## 2011-12-12 LAB — COMPREHENSIVE METABOLIC PANEL
AST: 21 U/L (ref 0–37)
Albumin: 3.8 g/dL (ref 3.5–5.2)
Alkaline Phosphatase: 116 U/L (ref 39–117)
Calcium: 9 mg/dL (ref 8.4–10.5)
Chloride: 107 mEq/L (ref 96–112)
Potassium: 4.1 mEq/L (ref 3.5–5.3)
Sodium: 140 mEq/L (ref 135–145)
Total Protein: 5.8 g/dL — ABNORMAL LOW (ref 6.0–8.3)

## 2011-12-12 LAB — CBC WITH DIFFERENTIAL (CANCER CENTER ONLY)
EOS%: 5.2 % (ref 0.0–7.0)
Eosinophils Absolute: 0.2 10*3/uL (ref 0.0–0.5)
HGB: 13.2 g/dL (ref 11.6–15.9)
LYMPH#: 0.7 10*3/uL — ABNORMAL LOW (ref 0.9–3.3)
MCH: 28.8 pg (ref 26.0–34.0)
MONO%: 13.6 % — ABNORMAL HIGH (ref 0.0–13.0)
NEUT#: 2 10*3/uL (ref 1.5–6.5)
Platelets: 120 10*3/uL — ABNORMAL LOW (ref 145–400)
RBC: 4.58 10*6/uL (ref 3.70–5.32)

## 2011-12-12 MED ORDER — ONDANSETRON 16 MG/50ML IVPB (CHCC)
16.0000 mg | Freq: Once | INTRAVENOUS | Status: AC
  Administered 2011-12-12: 16 mg via INTRAVENOUS

## 2011-12-12 MED ORDER — SODIUM CHLORIDE 0.9 % IJ SOLN
10.0000 mL | INTRAMUSCULAR | Status: DC | PRN
  Administered 2011-12-12: 10 mL
  Filled 2011-12-12: qty 10

## 2011-12-12 MED ORDER — SODIUM CHLORIDE 0.9 % IV SOLN
800.0000 mg/m2 | Freq: Once | INTRAVENOUS | Status: AC
  Administered 2011-12-12: 1340 mg via INTRAVENOUS
  Filled 2011-12-12: qty 67

## 2011-12-12 MED ORDER — SODIUM CHLORIDE 0.9 % IV SOLN
375.0000 mg/m2 | Freq: Once | INTRAVENOUS | Status: AC
  Administered 2011-12-12: 600 mg via INTRAVENOUS
  Filled 2011-12-12: qty 60

## 2011-12-12 MED ORDER — ACETAMINOPHEN 325 MG PO TABS
650.0000 mg | ORAL_TABLET | Freq: Once | ORAL | Status: AC
  Administered 2011-12-12: 650 mg via ORAL

## 2011-12-12 MED ORDER — SODIUM CHLORIDE 0.9 % IV SOLN
Freq: Once | INTRAVENOUS | Status: AC
  Administered 2011-12-12: 10:00:00 via INTRAVENOUS

## 2011-12-12 MED ORDER — VINCRISTINE SULFATE CHEMO INJECTION 1 MG/ML
1.5000 mg | Freq: Once | INTRAVENOUS | Status: AC
  Administered 2011-12-12: 1.5 mg via INTRAVENOUS
  Filled 2011-12-12: qty 1.5

## 2011-12-12 MED ORDER — DEXAMETHASONE SODIUM PHOSPHATE 4 MG/ML IJ SOLN
20.0000 mg | Freq: Once | INTRAMUSCULAR | Status: AC
  Administered 2011-12-12: 20 mg via INTRAVENOUS

## 2011-12-12 MED ORDER — HEPARIN SOD (PORK) LOCK FLUSH 100 UNIT/ML IV SOLN
500.0000 [IU] | Freq: Once | INTRAVENOUS | Status: AC | PRN
  Administered 2011-12-12: 500 [IU]
  Filled 2011-12-12: qty 5

## 2011-12-12 MED ORDER — DIPHENHYDRAMINE HCL 25 MG PO CAPS
50.0000 mg | ORAL_CAPSULE | Freq: Once | ORAL | Status: AC
  Administered 2011-12-12: 50 mg via ORAL

## 2011-12-12 NOTE — Progress Notes (Signed)
Diagnoses: #1 low-grade non-Hodgkin lymphoma. #2, severe hemolytic anemia, Coombs negative.  Current therapy: Status post 2 cycle of Rituxan-CVP (started on 10/31/2011).  Interim history: Kristine Sullivan presents today for an office followup visit.  She is status post 2 cyclea of Rituxan-CVP.  Overall, she has tolerated it quite well without any problems.  She is here today and states, that she still continues to feel quite well..  She notices significant decrease in swelling around her eyes.  She has excellent energy level.  She has a great appetite.  She denies any nausea, vomiting, diarrhea, constipation, any chest pain, shortness of breath, cough.  She denies any fevers, chills, or night sweats.  She denies any headaches, visual changes, or rashes.  She denies any obvious, or abnormal bleeding or bruising.  She denies any type of abdominal pain.  Her hemoglobin is excellent at 13.2.  Again, I believe her hemolytic anemia was coming from her  lymphoma.  She is Coombs negative.  Overall, she is doing quite well without any new problems or complaints.  She will go ahead with her second cycle today.  Review of Systems: Pt. Denies any changes in their vision, hearing, adenopathy, fevers, chills, nausea, vomiting, diarrhea, constipation, chest pain, shortness of breath, passing blood, passing out, blacking out,  any changes in skin, joints, neurologic or psychiatric except as noted.  Physical Exam:  this is a elderly 78 year old, African American, female, in no obvious distress Vitals: Temperature 97.5 degrees, pulse 65, respirations 16, blood pressure 117/60, weight 150 pounds HEENT reveals a normocephalic, atraumatic skull, no scleral icterus, no oral lesions, , she still has some minimal swelling around.  Her right. Neck is supple without any cervical or supraclavicular adenopathy.  Lungs are clear to auscultation bilaterally. There are no wheezes, rales or rhonci Cardiac is regular rate and rhythm with  a normal S1 and S2. There are no murmurs, rubs, or bruits.  Abdomen is soft with good bowel sounds, there is no palpable mass. There is no palpable hepatosplenomegaly. There is no palpable fluid wave.  Musculoskeletal no tenderness of the spine, ribs, or hips.  Extremities there are no clubbing, cyanosis, , trace edema in her lower legs.  Skin no petechia, purpura or ecchymosis Neurologic is nonfocal.  Laboratory Data:  White count 3.3, hemoglobin 13.2, hematocrit 30.9, platelets 120,000  Current Outpatient Prescriptions on File Prior to Visit  Medication Sig Dispense Refill  . amLODipine (NORVASC) 10 MG tablet Take 10 mg by mouth at bedtime.       [name] aspirin 325 MG tablet Take 325 mg by mouth daily.      . danazol (DANOCRINE) 100 MG capsule Take 100 mg by mouth daily. For angioedema      . fish oil-omega-3 fatty acids 1000 MG capsule Take 1 g by mouth daily.       . folic acid (FOLVITE) 1 MG tablet Take 1 mg by mouth every morning.      . furosemide (LASIX) 40 MG tablet Take 40 mg by mouth daily.      . hydrOXYzine (ATARAX/VISTARIL) 25 MG tablet Take 25 mg by mouth at bedtime as needed. For itching      . lidocaine-prilocaine (EMLA) cream Apply topically as needed. Apply to portacath site 1-2 hours prior to chemotherapy.  30 g  0  . LORazepam (ATIVAN) 0.5 MG tablet Take 1 tablet (0.5 mg total) by mouth every 6 (six) hours as needed (Nausea or vomiting).  30 tablet  0  .  lovastatin (MEVACOR) 40 MG tablet Take 40 mg by mouth at bedtime.       . metFORMIN (GLUCOPHAGE) 500 MG tablet Take 500 mg by mouth daily with breakfast.      . ondansetron (ZOFRAN) 8 MG tablet Take 1 tablet two times a day starting the day after chemo for 3 days. Then take 1 tablet two times a day as needed for nausea or vomiting.  30 tablet  1  . prochlorperazine (COMPAZINE) 10 MG tablet Take 1 tablet (10 mg total) by mouth every 6 (six) hours as needed (Nausea or vomiting).  30 tablet  1  . timolol (TIMOPTIC) 0.5 %  ophthalmic solution Place 1 drop into both eyes 2 (two) times daily.       . Travoprost, BAK Free, (TRAVATAN) 0.004 % SOLN ophthalmic solution Place 1 drop into both eyes at bedtime.      . triamterene-hydrochlorothiazide (DYAZIDE) 37.5-25 MG per capsule Take 1 capsule by mouth every morning.         Assessment/Plan: this is a pleasant, 78 year old, African American, female, with the following issues:  #1 low-grade non-Hodgkin lymphoma.  She is status post her second cycle of Rituxan-CVP.   She is tolerating her chemotherapy quite well.  She feels, excellent.  So far she has had a nice response. We will go ahead with her third cycle today.  #2 hemolytic anemia - Resolved.  Most likely secondary to #1.  [name]  She is having a nice response to her chemotherapy.  Her hemoglobin is 13.2 today.    #3 followup.  We will see Ms. Vancott back in 3 weeks before her fourth cycle, but before then should there be questions or concerns.

## 2011-12-12 NOTE — Patient Instructions (Signed)
[facility] Discharge Instructions for Patients Receiving Chemotherapy  Today you received the following chemotherapy agents Rituxan, Cytoxan, Vincristine,  Part of your chemotherapy regimen includes a steroid called Prednisone or Deltasone.  This is a pill to be taken at home.  Begin this today.  Take 4 tablets (80 mg total) by mouth once daily for 5 days after chemotherapy beginning 10/31/11.  After your dose on 11/04/11 you may stop this until the next cycle. To help prevent nausea and vomiting after your treatment, we encourage you to take your nausea medication  Take Ondansetron 8 mg (Zofran) one tablet twice daily.  Once in the morning and once in the evening (8 a and 8 p) beginning 9/10.  Take for 3 days after chemotherapy.  Can stop the Zofran after the dose on 11/03/11.  If still feeling nauseated can take Prochlorperazine 10 mg (Compazine) every 6 hours as needed for nausea.  Also, you can take Lorazepam (Ativan) .5 mg by mouth every 6 hours as needed for Nausea.     If you develop nausea and vomiting that is not controlled by your nausea medication, call the clinic [phone].  If it is after clinic hours your family physician or the after hours number for the clinic or go to the Emergency Department.   BELOW ARE SYMPTOMS THAT SHOULD BE REPORTED IMMEDIATELY:  *FEVER GREATER THAN 100.5 F  *CHILLS WITH OR WITHOUT FEVER  NAUSEA AND VOMITING THAT IS NOT CONTROLLED WITH YOUR NAUSEA MEDICATION  *UNUSUAL SHORTNESS OF BREATH  *UNUSUAL BRUISING OR BLEEDING  TENDERNESS IN MOUTH AND THROAT WITH OR WITHOUT PRESENCE OF ULCERS  *URINARY PROBLEMS  *BOWEL PROBLEMS  UNUSUAL RASH Items with * indicate a potential emergency and should be followed up as soon as possible.  One of the nurses will contact you 24 hours after your treatment. Please let the nurse know about any problems that you may have experienced. Feel free to call the clinic you have any questions or concerns. The clinic  phone number is [phone].   I have been informed and understand all the instructions given to me. I know to contact the clinic, my physician, or go to the Emergency Department if any problems should occur. I do not have any questions at this time, but understand that I may call the clinic during office hours   should I have any questions or need assistance in obtaining follow up care.    __________________________________________  _____________  __________ Signature of Patient or Authorized Representative            Date                   Time    __________________________________________ Nurse's Signature     Vincristine injection What is this medicine? VINCRISTINE (vin KRIS teen) is a chemotherapy drug. It slows the growth of cancer cells. This medicine is used to treat many types of cancer like Hodgkin's disease, leukemia, non-Hodgkin's lymphoma, neuroblastoma (brain cancer), rhabdomyosarcoma, and Wilms' tumor. This medicine may be used for other purposes; ask your health care provider or pharmacist if you have questions. What should I tell my health care provider before I take this medicine? They need to know if you have any of these conditions: -blood disorders -gout -infection (especially chickenpox, cold sores, or herpes) -kidney disease -liver disease -lung disease -nervous system disease like Charcot-Marie-Tooth (CMT) -recent or ongoing radiation therapy -an unusual or allergic reaction to vincristine, other chemotherapy agents, other medicines, foods,  dyes, or preservatives -pregnant or trying to get pregnant -breast-feeding How should I use this medicine? This drug is given as an infusion into a vein. It is administered in a hospital or clinic by a specially trained health care professional. If you have pain, swelling, burning, or any unusual feeling around the site of your injection, tell your health care professional right away. Talk to your pediatrician  regarding the use of this medicine in children. While this drug may be prescribed for selected conditions, precautions do apply. Overdosage: If you think you have taken too much of this medicine contact a poison control center or emergency room at once. NOTE: This medicine is only for you. Do not share this medicine with others. What if I miss a dose? It is important not to miss your dose. Call your doctor or health care professional if you are unable to keep an appointment. What may interact with this medicine? Do not take this medicine with any of the following medications: -itraconazole -mibefradil -voriconazole This medicine may also interact with the following medications: -cyclosporine -erythromycin -fluconazole -ketoconazole -medicines for HIV like delavirdine, efavirenz, nevirapine -medicines for seizures like ethotoin, fosphenotoin, phenytoin -medicines to increase blood counts like filgrastim, pegfilgrastim, sargramostim -other chemotherapy drugs like cisplatin, L-asparaginase, methotrexate, mitomycin, paclitaxel -pegaspargase -vaccines -zalcitabine, ddC Talk to your doctor or health care professional before taking any of these medicines: -acetaminophen -aspirin -ibuprofen -ketoprofen -naproxen This list may not describe all possible interactions. Give your health care provider a list of all the medicines, herbs, non-prescription drugs, or dietary supplements you use. Also tell them if you smoke, drink alcohol, or use illegal drugs. Some items may interact with your medicine. What should I watch for while using this medicine? Your condition will be monitored carefully while you are receiving this medicine. You will need important blood work done while you are taking this medicine. This drug may make you feel generally unwell. This is not uncommon, as chemotherapy can affect healthy cells as well as cancer cells. Report any side effects. Continue your course of treatment even  though you feel ill unless your doctor tells you to stop. In some cases, you may be given additional medicines to help with side effects. Follow all directions for their use. Call your doctor or health care professional for advice if you get a fever, chills or sore throat, or other symptoms of a cold or flu. Do not treat yourself. Avoid taking products that contain aspirin, acetaminophen, ibuprofen, naproxen, or ketoprofen unless instructed by your doctor. These medicines may hide a fever. Do not become pregnant while taking this medicine. Women should inform their doctor if they wish to become pregnant or think they might be pregnant. There is a potential for serious side effects to an unborn child. Talk to your health care professional or pharmacist for more information. Do not breast-feed an infant while taking this medicine. Men may have a lower sperm count while taking this medicine. Talk to your doctor if you plan to father a child. What side effects may I notice from receiving this medicine? Side effects that you should report to your doctor or health care professional as soon as possible: -allergic reactions like skin rash, itching or hives, swelling of the face, lips, or tongue -breathing problems -confusion or changes in emotions or moods -constipation -cough -mouth sores -muscle weakness -nausea and vomiting -pain, swelling, redness or irritation at the injection site -pain, tingling, numbness in the hands or feet -problems with balance, talking, walking -  seizures -stomach pain -trouble passing urine or change in the amount of urine Side effects that usually do not require medical attention (report to your doctor or health care professional if they continue or are bothersome): -diarrhea -hair loss -jaw pain -loss of appetite This list may not describe all possible side effects. Call your doctor for medical advice about side effects. You may report side effects to FDA at  1-800-FDA-1088. Where should I keep my medicine? This drug is given in a hospital or clinic and will not be stored at home. NOTE: This sheet is a summary. It may not cover all possible information. If you have questions about this medicine, talk to your doctor, pharmacist, or health care provider.  2012, [employer] Standard. (11/05/2007 5:17:13 PM)   Cyclophosphamide injection What is this medicine? CYCLOPHOSPHAMIDE (sye kloe FOSS fa mide) is a chemotherapy drug. It slows the growth of cancer cells. This medicine is used to treat many types of cancer like lymphoma, myeloma, leukemia, breast cancer, and ovarian cancer, to name a few. It is also used to treat nephrotic syndrome in children. This medicine may be used for other purposes; ask your health care provider or pharmacist if you have questions. What should I tell my health care provider before I take this medicine? They need to know if you have any of these conditions: -blood disorders -history of other chemotherapy -history of radiation therapy -infection -kidney disease -liver disease -tumors in the bone marrow -an unusual or allergic reaction to cyclophosphamide, other chemotherapy, other medicines, foods, dyes, or preservatives -pregnant or trying to get pregnant -breast-feeding How should I use this medicine? This drug is usually given as an injection into a vein or muscle or by infusion into a vein. It is administered in a hospital or clinic by a specially trained health care professional. Talk to your pediatrician regarding the use of this medicine in children. While this drug may be prescribed for selected conditions, precautions do apply. Overdosage: If you think you have taken too much of this medicine contact a poison control center or emergency room at once. NOTE: This medicine is only for you. Do not share this medicine with others. What if I miss a dose? It is important not to miss your dose. Call your doctor or  health care professional if you are unable to keep an appointment. What may interact with this medicine? Do not take this medicine with any of the following medications: -mibefradil -nalidixic acid This medicine may also interact with the following medications: -doxorubicin -etanercept -medicines to increase blood counts like filgrastim, pegfilgrastim, sargramostim -medicines that block muscle or nerve pain -St. John's Wort -phenobarbital -succinylcholine chloride -trastuzumab -vaccines Talk to your doctor or health care professional before taking any of these medicines: -acetaminophen -aspirin -ibuprofen -ketoprofen -naproxen This list may not describe all possible interactions. Give your health care provider a list of all the medicines, herbs, non-prescription drugs, or dietary supplements you use. Also tell them if you smoke, drink alcohol, or use illegal drugs. Some items may interact with your medicine. What should I watch for while using this medicine? Visit your doctor for checks on your progress. This drug may make you feel generally unwell. This is not uncommon, as chemotherapy can affect healthy cells as well as cancer cells. Report any side effects. Continue your course of treatment even though you feel ill unless your doctor tells you to stop. Drink water or other fluids as directed. Urinate often, even at night. In some cases, you  may be given additional medicines to help with side effects. Follow all directions for their use. Call your doctor or health care professional for advice if you get a fever, chills or sore throat, or other symptoms of a cold or flu. Do not treat yourself. This drug decreases your body's ability to fight infections. Try to avoid being around people who are sick. This medicine may increase your risk to bruise or bleed. Call your doctor or health care professional if you notice any unusual bleeding. Be careful brushing and flossing your teeth or using  a toothpick because you may get an infection or bleed more easily. If you have any dental work done, tell your dentist you are receiving this medicine. Avoid taking products that contain aspirin, acetaminophen, ibuprofen, naproxen, or ketoprofen unless instructed by your doctor. These medicines may hide a fever. Do not become pregnant while taking this medicine. Women should inform their doctor if they wish to become pregnant or think they might be pregnant. There is a potential for serious side effects to an unborn child. Talk to your health care professional or pharmacist for more information. Do not breast-feed an infant while taking this medicine. Men should inform their doctor if they wish to father a child. This medicine may lower sperm counts. If you are going to have surgery, tell your doctor or health care professional that you have taken this medicine. What side effects may I notice from receiving this medicine? Side effects that you should report to your doctor or health care professional as soon as possible: -allergic reactions like skin rash, itching or hives, swelling of the face, lips, or tongue -low blood counts - this medicine may decrease the number of white blood cells, red blood cells and platelets. You may be at increased risk for infections and bleeding. -signs of infection - fever or chills, cough, sore throat, pain or difficulty passing urine -signs of decreased platelets or bleeding - bruising, pinpoint red spots on the skin, black, tarry stools, blood in the urine -signs of decreased red blood cells - unusually weak or tired, fainting spells, lightheadedness -breathing problems -dark urine -mouth sores -pain, swelling, redness at site where injected -swelling of the ankles, feet, hands -trouble passing urine or change in the amount of urine -weight gain -yellowing of the eyes or skin Side effects that usually do not require medical attention (report to your doctor or  health care professional if they continue or are bothersome): -changes in nail or skin color -diarrhea -hair loss -loss of appetite -missed menstrual periods -nausea, vomiting -stomach pain This list may not describe all possible side effects. Call your doctor for medical advice about side effects. You may report side effects to FDA at 1-800-FDA-1088. Where should I keep my medicine? This drug is given in a hospital or clinic and will not be stored at home. NOTE: This sheet is a summary. It may not cover all possible information. If you have questions about this medicine, talk to your doctor, pharmacist, or health care provider.  2012, [employer]/Gold Standard. (05/15/2007 2:32:25 PM)   Rituximab injection What is this medicine? RITUXIMAB (ri TUX i mab) is a monoclonal antibody. This medicine changes the way the body's immune system works. It is used commonly to treat non-Hodgkin's lymphoma and other conditions. In cancer cells, this drug targets a specific protein within cancer cells and stops the cancer cells from growing. It is also used to treat rhuematoid arthritis (RA). In RA, this medicine slow the inflammatory  process and help reduce joint pain and swelling. This medicine is often used with other cancer or arthritis medications. This medicine may be used for other purposes; ask your health care provider or pharmacist if you have questions. What should I tell my health care provider before I take this medicine? They need to know if you have any of these conditions: -blood disorders -heart disease -history of hepatitis B -infection (especially a virus infection such as chickenpox, cold sores, or herpes) -irregular heartbeat -kidney disease -lung or breathing disease, like asthma -lupus -an unusual or allergic reaction to rituximab, mouse proteins, other medicines, foods, dyes, or preservatives -pregnant or trying to get pregnant -breast-feeding How should I use this medicine? This  medicine is for infusion into a vein. It is administered in a hospital or clinic by a specially trained health care professional. A special MedGuide will be given to you by the pharmacist with each prescription and refill. Be sure to read this information carefully each time. Talk to your pediatrician regarding the use of this medicine in children. This medicine is not approved for use in children. Overdosage: If you think you have taken too much of this medicine contact a poison control center or emergency room at once. NOTE: This medicine is only for you. Do not share this medicine with others. What if I miss a dose? It is important not to miss a dose. Call your doctor or health care professional if you are unable to keep an appointment. What may interact with this medicine? -cisplatin -medicines for blood pressure -some other medicines for arthritis -vaccines This list may not describe all possible interactions. Give your health care provider a list of all the medicines, herbs, non-prescription drugs, or dietary supplements you use. Also tell them if you smoke, drink alcohol, or use illegal drugs. Some items may interact with your medicine. What should I watch for while using this medicine? Report any side effects that you notice during your treatment right away, such as changes in your breathing, fever, chills, dizziness or lightheadedness. These effects are more common with the first dose. Visit your prescriber or health care professional for checks on your progress. You will need to have regular blood work. Report any other side effects. The side effects of this medicine can continue after you finish your treatment. Continue your course of treatment even though you feel ill unless your doctor tells you to stop. Call your doctor or health care professional for advice if you get a fever, chills or sore throat, or other symptoms of a cold or flu. Do not treat yourself. This drug decreases your  body's ability to fight infections. Try to avoid being around people who are sick. This medicine may increase your risk to bruise or bleed. Call your doctor or health care professional if you notice any unusual bleeding. Be careful brushing and flossing your teeth or using a toothpick because you may get an infection or bleed more easily. If you have any dental work done, tell your dentist you are receiving this medicine. Avoid taking products that contain aspirin, acetaminophen, ibuprofen, naproxen, or ketoprofen unless instructed by your doctor. These medicines may hide a fever. Do not become pregnant while taking this medicine. Women should inform their doctor if they wish to become pregnant or think they might be pregnant. There is a potential for serious side effects to an unborn child. Talk to your health care professional or pharmacist for more information. Do not breast-feed an infant while taking  this medicine. What side effects may I notice from receiving this medicine? Side effects that you should report to your doctor or health care professional as soon as possible: -allergic reactions like skin rash, itching or hives, swelling of the face, lips, or tongue -low blood counts - this medicine may decrease the number of white blood cells, red blood cells and platelets. You may be at increased risk for infections and bleeding. -signs of infection - fever or chills, cough, sore throat, pain or difficulty passing urine -signs of decreased platelets or bleeding - bruising, pinpoint red spots on the skin, black, tarry stools, blood in the urine -signs of decreased red blood cells - unusually weak or tired, fainting spells, lightheadedness -breathing problems -confused, not responsive -chest pain -fast, irregular heartbeat -feeling faint or lightheaded, falls -mouth sores -redness, blistering, peeling or loosening of the skin, including inside the mouth -stomach pain -swelling of the ankles,  feet, or hands -trouble passing urine or change in the amount of urine Side effects that usually do not require medical attention (report to your doctor or other health care professional if they continue or are bothersome): -anxiety -headache -loss of appetite -muscle aches -nausea -night sweats This list may not describe all possible side effects. Call your doctor for medical advice about side effects. You may report side effects to FDA at 1-800-FDA-1088. Where should I keep my medicine? This drug is given in a hospital or clinic and will not be stored at home. NOTE: This sheet is a summary. It may not cover all possible information. If you have questions about this medicine, talk to your doctor, pharmacist, or health care provider.  2012, [employer] Standard. (10/08/2007 2:04:59 PM)Bortezomib injection What is this medicine? BORTEZOMIB (bor TEZ oh mib) is a chemotherapy drug. It slows the growth of cancer cells. This medicine is used to treat multiple myeloma, lymphoma, and other cancers. This medicine may be used for other purposes; ask your health care provider or pharmacist if you have questions. What should I tell my health care provider before I take this medicine? They need to know if you have any of these conditions: -heart disease -irregular heartbeat -liver disease -low blood counts, like low white blood cells, platelets, or hemoglobin -peripheral neuropathy -taking medicine for blood pressure -an unusual or allergic reaction to bortezomib, mannitol, boron, other medicines, foods, dyes, or preservatives -pregnant or trying to get pregnant -breast-feeding How should I use this medicine? This medicine is for injection into a vein or for injection under the skin. It is given by a health care professional in a hospital or clinic setting. Talk to your pediatrician regarding the use of this medicine in children. Special care may be needed. Overdosage: If you think you have  taken too much of this medicine contact a poison control center or emergency room at once. NOTE: This medicine is only for you. Do not share this medicine with others. What if I miss a dose? It is important not to miss your dose. Call your doctor or health care professional if you are unable to keep an appointment. What may interact with this medicine? -medicines for diabetes -medicines to increase blood counts like filgrastim, pegfilgrastim, sargramostim -zalcitabine Talk to your doctor or health care professional before taking any of these medicines: -acetaminophen -aspirin -ibuprofen -ketoprofen -naproxen This list may not describe all possible interactions. Give your health care provider a list of all the medicines, herbs, non-prescription drugs, or dietary supplements you use. Also tell them if you  smoke, drink alcohol, or use illegal drugs. Some items may interact with your medicine. What should I watch for while using this medicine? Visit your doctor for checks on your progress. This drug may make you feel generally unwell. This is not uncommon, as chemotherapy can affect healthy cells as well as cancer cells. Report any side effects. Continue your course of treatment even though you feel ill unless your doctor tells you to stop. You may get drowsy or dizzy. Do not drive, use machinery, or do anything that needs mental alertness until you know how this medicine affects you. Do not stand or sit up quickly, especially if you are an older patient. This reduces the risk of dizzy or fainting spells. In some cases, you may be given additional medicines to help with side effects. Follow all directions for their use. Call your doctor or health care professional for advice if you get a fever, chills or sore throat, or other symptoms of a cold or flu. Do not treat yourself. This drug decreases your body's ability to fight infections. Try to avoid being around people who are sick. This medicine may  increase your risk to bruise or bleed. Call your doctor or health care professional if you notice any unusual bleeding. Be careful brushing and flossing your teeth or using a toothpick because you may get an infection or bleed more easily. If you have any dental work done, tell your dentist you are receiving this medicine. Avoid taking products that contain aspirin, acetaminophen, ibuprofen, naproxen, or ketoprofen unless instructed by your doctor. These medicines may hide a fever. Do not become pregnant while taking this medicine. Women should inform their doctor if they wish to become pregnant or think they might be pregnant. There is a potential for serious side effects to an unborn child. Talk to your health care professional or pharmacist for more information. Do not breast-feed an infant while taking this medicine. You may have vomiting or diarrhea while taking this medicine. Drink water or other fluids as directed. What side effects may I notice from receiving this medicine? Side effects that you should report to your doctor or health care professional as soon as possible: -allergic reactions like skin rash, itching or hives, swelling of the face, lips, or tongue -breathing problems -changes in hearing -changes in vision -fast, irregular heartbeat -feeling faint or lightheaded, falls -pain, tingling, numbness in the hands or feet -seizures -swelling of the ankles, feet, hands -unusual bleeding or bruising -unusually weak or tired -vomiting Side effects that usually do not require medical attention (report to your doctor or health care professional if they continue or are bothersome): -changes in emotions or moods -constipation -diarrhea -loss of appetite -headache -irritation at site where injected -nausea This list may not describe all possible side effects. Call your doctor for medical advice about side effects. You may report side effects to FDA at 1-800-FDA-1088. Where should I  keep my medicine? This drug is given in a hospital or clinic and will not be stored at home. NOTE: This sheet is a summary. It may not cover all possible information. If you have questions about this medicine, talk to your doctor, pharmacist, or health care provider.  2012, [employer]. (03/17/2010 11:42:36 AM)

## 2011-12-26 ENCOUNTER — Encounter (HOSPITAL_COMMUNITY)
Admission: RE | Admit: 2011-12-26 | Discharge: 2011-12-26 | Disposition: A | Discharge status: Home / Self Care | Payer: Medicare Other | Source: Ambulatory Visit | Attending: Hematology & Oncology | Admitting: Hematology & Oncology

## 2011-12-26 DIAGNOSIS — D599 Acquired hemolytic anemia, unspecified: Secondary | ICD-10-CM | POA: Insufficient documentation

## 2012-01-02 ENCOUNTER — Other Ambulatory Visit: Payer: Medicare Other | Admitting: Lab

## 2012-01-02 ENCOUNTER — Telehealth: Payer: Self-pay | Admitting: Hematology & Oncology

## 2012-01-02 ENCOUNTER — Ambulatory Visit (HOSPITAL_BASED_OUTPATIENT_CLINIC_OR_DEPARTMENT_OTHER): Payer: Medicare Other | Admitting: Hematology & Oncology

## 2012-01-02 ENCOUNTER — Ambulatory Visit: Payer: Medicare Other | Admitting: Hematology & Oncology

## 2012-01-02 ENCOUNTER — Ambulatory Visit (HOSPITAL_BASED_OUTPATIENT_CLINIC_OR_DEPARTMENT_OTHER): Payer: Medicare Other

## 2012-01-02 ENCOUNTER — Ambulatory Visit: Payer: Medicare Other

## 2012-01-02 ENCOUNTER — Other Ambulatory Visit (HOSPITAL_BASED_OUTPATIENT_CLINIC_OR_DEPARTMENT_OTHER): Payer: Medicare Other | Admitting: Lab

## 2012-01-02 VITALS — BP 132/50 | HR 18 | Temp 97.4°F | Resp 62 | Ht 61.0 in | Wt 151.0 lb

## 2012-01-02 VITALS — BP 137/70 | HR 68 | Temp 97.1°F | Resp 18

## 2012-01-02 DIAGNOSIS — C8307 Small cell B-cell lymphoma, spleen: Secondary | ICD-10-CM

## 2012-01-02 DIAGNOSIS — Z5111 Encounter for antineoplastic chemotherapy: Secondary | ICD-10-CM

## 2012-01-02 DIAGNOSIS — Z5112 Encounter for antineoplastic immunotherapy: Secondary | ICD-10-CM

## 2012-01-02 DIAGNOSIS — C8589 Other specified types of non-Hodgkin lymphoma, extranodal and solid organ sites: Secondary | ICD-10-CM

## 2012-01-02 LAB — CBC WITH DIFFERENTIAL (CANCER CENTER ONLY)
BASO#: 0 10*3/uL (ref 0.0–0.2)
EOS%: 6 % (ref 0.0–7.0)
HGB: 13.4 g/dL (ref 11.6–15.9)
LYMPH#: 0.5 10*3/uL — ABNORMAL LOW (ref 0.9–3.3)
MCHC: 34 g/dL (ref 32.0–36.0)
MONO#: 0.5 10*3/uL (ref 0.1–0.9)
NEUT#: 1.4 10*3/uL — ABNORMAL LOW (ref 1.5–6.5)
RBC: 4.82 10*6/uL (ref 3.70–5.32)
WBC: 2.5 10*3/uL — ABNORMAL LOW (ref 3.9–10.0)

## 2012-01-02 LAB — COMPREHENSIVE METABOLIC PANEL
BUN: 7 mg/dL (ref 6–23)
CO2: 28 mEq/L (ref 19–32)
Calcium: 8.8 mg/dL (ref 8.4–10.5)
Chloride: 104 mEq/L (ref 96–112)
Creatinine, Ser: 0.63 mg/dL (ref 0.50–1.10)
Total Bilirubin: 0.4 mg/dL (ref 0.3–1.2)

## 2012-01-02 LAB — LACTATE DEHYDROGENASE: LDH: 193 U/L (ref 94–250)

## 2012-01-02 MED ORDER — DIPHENHYDRAMINE HCL 25 MG PO CAPS
50.0000 mg | ORAL_CAPSULE | Freq: Once | ORAL | Status: AC
  Administered 2012-01-02: 50 mg via ORAL

## 2012-01-02 MED ORDER — HEPARIN SOD (PORK) LOCK FLUSH 100 UNIT/ML IV SOLN
500.0000 [IU] | Freq: Once | INTRAVENOUS | Status: AC | PRN
  Administered 2012-01-02: 500 [IU]
  Filled 2012-01-02: qty 5

## 2012-01-02 MED ORDER — ONDANSETRON 16 MG/50ML IVPB (CHCC)
16.0000 mg | Freq: Once | INTRAVENOUS | Status: AC
  Administered 2012-01-02: 16 mg via INTRAVENOUS

## 2012-01-02 MED ORDER — SODIUM CHLORIDE 0.9 % IV SOLN
1.5000 mg | Freq: Once | INTRAVENOUS | Status: AC
  Administered 2012-01-02: 1.5 mg via INTRAVENOUS
  Filled 2012-01-02: qty 1.5

## 2012-01-02 MED ORDER — DEXAMETHASONE SODIUM PHOSPHATE 4 MG/ML IJ SOLN
20.0000 mg | Freq: Once | INTRAMUSCULAR | Status: AC
  Administered 2012-01-02: 20 mg via INTRAVENOUS

## 2012-01-02 MED ORDER — SODIUM CHLORIDE 0.9 % IJ SOLN
10.0000 mL | INTRAMUSCULAR | Status: DC | PRN
  Administered 2012-01-02: 10 mL
  Filled 2012-01-02: qty 10

## 2012-01-02 MED ORDER — SODIUM CHLORIDE 0.9 % IV SOLN
Freq: Once | INTRAVENOUS | Status: AC
  Administered 2012-01-02: 10:00:00 via INTRAVENOUS

## 2012-01-02 MED ORDER — ACETAMINOPHEN 325 MG PO TABS
650.0000 mg | ORAL_TABLET | Freq: Once | ORAL | Status: AC
  Administered 2012-01-02: 650 mg via ORAL

## 2012-01-02 MED ORDER — SODIUM CHLORIDE 0.9 % IV SOLN
375.0000 mg/m2 | Freq: Once | INTRAVENOUS | Status: AC
  Administered 2012-01-02: 600 mg via INTRAVENOUS
  Filled 2012-01-02: qty 60

## 2012-01-02 MED ORDER — SODIUM CHLORIDE 0.9 % IV SOLN
800.0000 mg/m2 | Freq: Once | INTRAVENOUS | Status: AC
  Administered 2012-01-02: 1340 mg via INTRAVENOUS
  Filled 2012-01-02: qty 67

## 2012-01-02 NOTE — Telephone Encounter (Signed)
Patient called and cx 01/02/12 appt due to transportation.  Her son did not show to pick her up and bring her to her appt today.  She called from roommate phone [phone].  She asked for scheduler to call her back to resch her appt.  Darl Pikes was notified of the cx appt

## 2012-01-02 NOTE — Patient Instructions (Addendum)
[facility] Discharge Instructions for Patients Receiving Chemotherapy  Today you received the following chemotherapy agents Rituxan, Cytoxan, Vincristine,  Part of your chemotherapy regimen includes a steroid called Prednisone or Deltasone.  This is a pill to be taken at home.  Begin this today.  Take 4 tablets (80 mg total) by mouth once daily for 5 days after chemotherapy beginning 10/31/11.  After your dose on 11/04/11 you may stop this until the next cycle. To help prevent nausea and vomiting after your treatment, we encourage you to take your nausea medication  Take Ondansetron 8 mg (Zofran) one tablet twice daily.  Once in the morning and once in the evening (8 a and 8 p) beginning 9/10.  Take for 3 days after chemotherapy.  Can stop the Zofran after the dose on 11/03/11.  If still feeling nauseated can take Prochlorperazine 10 mg (Compazine) every 6 hours as needed for nausea.  Also, you can take Lorazepam (Ativan) .5 mg by mouth every 6 hours as needed for Nausea.     If you develop nausea and vomiting that is not controlled by your nausea medication, call the clinic [phone].  If it is after clinic hours your family physician or the after hours number for the clinic or go to the Emergency Department.   BELOW ARE SYMPTOMS THAT SHOULD BE REPORTED IMMEDIATELY:  *FEVER GREATER THAN 100.5 F  *CHILLS WITH OR WITHOUT FEVER  NAUSEA AND VOMITING THAT IS NOT CONTROLLED WITH YOUR NAUSEA MEDICATION  *UNUSUAL SHORTNESS OF BREATH  *UNUSUAL BRUISING OR BLEEDING  TENDERNESS IN MOUTH AND THROAT WITH OR WITHOUT PRESENCE OF ULCERS  *URINARY PROBLEMS  *BOWEL PROBLEMS  UNUSUAL RASH Items with * indicate a potential emergency and should be followed up as soon as possible.  One of the nurses will contact you 24 hours after your treatment. Please let the nurse know about any problems that you may have experienced. Feel free to call the clinic you have any questions or concerns. The clinic  phone number is [phone].   I have been informed and understand all the instructions given to me. I know to contact the clinic, my physician, or go to the Emergency Department if any problems should occur. I do not have any questions at this time, but understand that I may call the clinic during office hours   should I have any questions or need assistance in obtaining follow up care.    __________________________________________  _____________  __________ Signature of Patient or Authorized Representative            Date                   Time   Vincristine injection What is this medicine? VINCRISTINE (vin KRIS teen) is a chemotherapy drug. It slows the growth of cancer cells. This medicine is used to treat many types of cancer like Hodgkin's disease, leukemia, non-Hodgkin's lymphoma, neuroblastoma (brain cancer), rhabdomyosarcoma, and Wilms' tumor. This medicine may be used for other purposes; ask your health care provider or pharmacist if you have questions. What should I tell my health care provider before I take this medicine? They need to know if you have any of these conditions: -blood disorders -gout -infection (especially chickenpox, cold sores, or herpes) -kidney disease -liver disease -lung disease -nervous system disease like Charcot-Marie-Tooth (CMT) -recent or ongoing radiation therapy -an unusual or allergic reaction to vincristine, other chemotherapy agents, other medicines, foods, dyes, or preservatives -pregnant or trying to get  pregnant -breast-feeding How should I use this medicine? This drug is given as an infusion into a vein. It is administered in a hospital or clinic by a specially trained health care professional. If you have pain, swelling, burning, or any unusual feeling around the site of your injection, tell your health care professional right away. Talk to your pediatrician regarding the use of this medicine in children. While this drug may be  prescribed for selected conditions, precautions do apply. Overdosage: If you think you have taken too much of this medicine contact a poison control center or emergency room at once. NOTE: This medicine is only for you. Do not share this medicine with others. What if I miss a dose? It is important not to miss your dose. Call your doctor or health care professional if you are unable to keep an appointment. What may interact with this medicine? Do not take this medicine with any of the following medications: -itraconazole -mibefradil -voriconazole This medicine may also interact with the following medications: -cyclosporine -erythromycin -fluconazole -ketoconazole -medicines for HIV like delavirdine, efavirenz, nevirapine -medicines for seizures like ethotoin, fosphenotoin, phenytoin -medicines to increase blood counts like filgrastim, pegfilgrastim, sargramostim -other chemotherapy drugs like cisplatin, L-asparaginase, methotrexate, mitomycin, paclitaxel -pegaspargase -vaccines -zalcitabine, ddC Talk to your doctor or health care professional before taking any of these medicines: -acetaminophen -aspirin -ibuprofen -ketoprofen -naproxen This list may not describe all possible interactions. Give your health care provider a list of all the medicines, herbs, non-prescription drugs, or dietary supplements you use. Also tell them if you smoke, drink alcohol, or use illegal drugs. Some items may interact with your medicine. What should I watch for while using this medicine? Your condition will be monitored carefully while you are receiving this medicine. You will need important blood work done while you are taking this medicine. This drug may make you feel generally unwell. This is not uncommon, as chemotherapy can affect healthy cells as well as cancer cells. Report any side effects. Continue your course of treatment even though you feel ill unless your doctor tells you to stop. In some  cases, you may be given additional medicines to help with side effects. Follow all directions for their use. Call your doctor or health care professional for advice if you get a fever, chills or sore throat, or other symptoms of a cold or flu. Do not treat yourself. Avoid taking products that contain aspirin, acetaminophen, ibuprofen, naproxen, or ketoprofen unless instructed by your doctor. These medicines may hide a fever. Do not become pregnant while taking this medicine. Women should inform their doctor if they wish to become pregnant or think they might be pregnant. There is a potential for serious side effects to an unborn child. Talk to your health care professional or pharmacist for more information. Do not breast-feed an infant while taking this medicine. Men may have a lower sperm count while taking this medicine. Talk to your doctor if you plan to father a child. What side effects may I notice from receiving this medicine? Side effects that you should report to your doctor or health care professional as soon as possible: -allergic reactions like skin rash, itching or hives, swelling of the face, lips, or tongue -breathing problems -confusion or changes in emotions or moods -constipation -cough -mouth sores -muscle weakness -nausea and vomiting -pain, swelling, redness or irritation at the injection site -pain, tingling, numbness in the hands or feet -problems with balance, talking, walking -seizures -stomach pain -trouble passing urine or change  in the amount of urine Side effects that usually do not require medical attention (report to your doctor or health care professional if they continue or are bothersome): -diarrhea -hair loss -jaw pain -loss of appetite This list may not describe all possible side effects. Call your doctor for medical advice about side effects. You may report side effects to FDA at 1-800-FDA-1088. Where should I keep my medicine? This drug is given in a  hospital or clinic and will not be stored at home. NOTE: This sheet is a summary. It may not cover all possible information. If you have questions about this medicine, talk to your doctor, pharmacist, or health care provider.  2012, [employer] Standard. (11/05/2007 5:17:13 PM)  Cyclophosphamide injection What is this medicine? CYCLOPHOSPHAMIDE (sye kloe FOSS fa mide) is a chemotherapy drug. It slows the growth of cancer cells. This medicine is used to treat many types of cancer like lymphoma, myeloma, leukemia, breast cancer, and ovarian cancer, to name a few. It is also used to treat nephrotic syndrome in children. This medicine may be used for other purposes; ask your health care provider or pharmacist if you have questions. What should I tell my health care provider before I take this medicine? They need to know if you have any of these conditions: -blood disorders -history of other chemotherapy -history of radiation therapy -infection -kidney disease -liver disease -tumors in the bone marrow -an unusual or allergic reaction to cyclophosphamide, other chemotherapy, other medicines, foods, dyes, or preservatives -pregnant or trying to get pregnant -breast-feeding How should I use this medicine? This drug is usually given as an injection into a vein or muscle or by infusion into a vein. It is administered in a hospital or clinic by a specially trained health care professional. Talk to your pediatrician regarding the use of this medicine in children. While this drug may be prescribed for selected conditions, precautions do apply. Overdosage: If you think you have taken too much of this medicine contact a poison control center or emergency room at once. NOTE: This medicine is only for you. Do not share this medicine with others. What if I miss a dose? It is important not to miss your dose. Call your doctor or health care professional if you are unable to keep an appointment. What may  interact with this medicine? Do not take this medicine with any of the following medications: -mibefradil -nalidixic acid This medicine may also interact with the following medications: -doxorubicin -etanercept -medicines to increase blood counts like filgrastim, pegfilgrastim, sargramostim -medicines that block muscle or nerve pain -St. John's Wort -phenobarbital -succinylcholine chloride -trastuzumab -vaccines Talk to your doctor or health care professional before taking any of these medicines: -acetaminophen -aspirin -ibuprofen -ketoprofen -naproxen This list may not describe all possible interactions. Give your health care provider a list of all the medicines, herbs, non-prescription drugs, or dietary supplements you use. Also tell them if you smoke, drink alcohol, or use illegal drugs. Some items may interact with your medicine. What should I watch for while using this medicine? Visit your doctor for checks on your progress. This drug may make you feel generally unwell. This is not uncommon, as chemotherapy can affect healthy cells as well as cancer cells. Report any side effects. Continue your course of treatment even though you feel ill unless your doctor tells you to stop. Drink water or other fluids as directed. Urinate often, even at night. In some cases, you may be given additional medicines to help with side  effects. Follow all directions for their use. Call your doctor or health care professional for advice if you get a fever, chills or sore throat, or other symptoms of a cold or flu. Do not treat yourself. This drug decreases your body's ability to fight infections. Try to avoid being around people who are sick. This medicine may increase your risk to bruise or bleed. Call your doctor or health care professional if you notice any unusual bleeding. Be careful brushing and flossing your teeth or using a toothpick because you may get an infection or bleed more easily. If you  have any dental work done, tell your dentist you are receiving this medicine. Avoid taking products that contain aspirin, acetaminophen, ibuprofen, naproxen, or ketoprofen unless instructed by your doctor. These medicines may hide a fever. Do not become pregnant while taking this medicine. Women should inform their doctor if they wish to become pregnant or think they might be pregnant. There is a potential for serious side effects to an unborn child. Talk to your health care professional or pharmacist for more information. Do not breast-feed an infant while taking this medicine. Men should inform their doctor if they wish to father a child. This medicine may lower sperm counts. If you are going to have surgery, tell your doctor or health care professional that you have taken this medicine. What side effects may I notice from receiving this medicine? Side effects that you should report to your doctor or health care professional as soon as possible: -allergic reactions like skin rash, itching or hives, swelling of the face, lips, or tongue -low blood counts - this medicine may decrease the number of white blood cells, red blood cells and platelets. You may be at increased risk for infections and bleeding. -signs of infection - fever or chills, cough, sore throat, pain or difficulty passing urine -signs of decreased platelets or bleeding - bruising, pinpoint red spots on the skin, black, tarry stools, blood in the urine -signs of decreased red blood cells - unusually weak or tired, fainting spells, lightheadedness -breathing problems -dark urine -mouth sores -pain, swelling, redness at site where injected -swelling of the ankles, feet, hands -trouble passing urine or change in the amount of urine -weight gain -yellowing of the eyes or skin Side effects that usually do not require medical attention (report to your doctor or health care professional if they continue or are bothersome): -changes in  nail or skin color -diarrhea -hair loss -loss of appetite -missed menstrual periods -nausea, vomiting -stomach pain This list may not describe all possible side effects. Call your doctor for medical advice about side effects. You may report side effects to FDA at 1-800-FDA-1088. Where should I keep my medicine? This drug is given in a hospital or clinic and will not be stored at home. NOTE: This sheet is a summary. It may not cover all possible information. If you have questions about this medicine, talk to your doctor, pharmacist, or health care provider.  2012, [employer]/Gold Standard. (05/15/2007 2:32:25 PM)  Rituximab injection What is this medicine? RITUXIMAB (ri TUX i mab) is a monoclonal antibody. This medicine changes the way the body's immune system works. It is used commonly to treat non-Hodgkin's lymphoma and other conditions. In cancer cells, this drug targets a specific protein within cancer cells and stops the cancer cells from growing. It is also used to treat rhuematoid arthritis (RA). In RA, this medicine slow the inflammatory process and help reduce joint pain and swelling. This medicine  is often used with other cancer or arthritis medications. This medicine may be used for other purposes; ask your health care provider or pharmacist if you have questions. What should I tell my health care provider before I take this medicine? They need to know if you have any of these conditions: -blood disorders -heart disease -history of hepatitis B -infection (especially a virus infection such as chickenpox, cold sores, or herpes) -irregular heartbeat -kidney disease -lung or breathing disease, like asthma -lupus -an unusual or allergic reaction to rituximab, mouse proteins, other medicines, foods, dyes, or preservatives -pregnant or trying to get pregnant -breast-feeding How should I use this medicine? This medicine is for infusion into a vein. It is administered in a hospital or  clinic by a specially trained health care professional. A special MedGuide will be given to you by the pharmacist with each prescription and refill. Be sure to read this information carefully each time. Talk to your pediatrician regarding the use of this medicine in children. This medicine is not approved for use in children. Overdosage: If you think you have taken too much of this medicine contact a poison control center or emergency room at once. NOTE: This medicine is only for you. Do not share this medicine with others. What if I miss a dose? It is important not to miss a dose. Call your doctor or health care professional if you are unable to keep an appointment. What may interact with this medicine? -cisplatin -medicines for blood pressure -some other medicines for arthritis -vaccines This list may not describe all possible interactions. Give your health care provider a list of all the medicines, herbs, non-prescription drugs, or dietary supplements you use. Also tell them if you smoke, drink alcohol, or use illegal drugs. Some items may interact with your medicine. What should I watch for while using this medicine? Report any side effects that you notice during your treatment right away, such as changes in your breathing, fever, chills, dizziness or lightheadedness. These effects are more common with the first dose. Visit your prescriber or health care professional for checks on your progress. You will need to have regular blood work. Report any other side effects. The side effects of this medicine can continue after you finish your treatment. Continue your course of treatment even though you feel ill unless your doctor tells you to stop. Call your doctor or health care professional for advice if you get a fever, chills or sore throat, or other symptoms of a cold or flu. Do not treat yourself. This drug decreases your body's ability to fight infections. Try to avoid being around people who are  sick. This medicine may increase your risk to bruise or bleed. Call your doctor or health care professional if you notice any unusual bleeding. Be careful brushing and flossing your teeth or using a toothpick because you may get an infection or bleed more easily. If you have any dental work done, tell your dentist you are receiving this medicine. Avoid taking products that contain aspirin, acetaminophen, ibuprofen, naproxen, or ketoprofen unless instructed by your doctor. These medicines may hide a fever. Do not become pregnant while taking this medicine. Women should inform their doctor if they wish to become pregnant or think they might be pregnant. There is a potential for serious side effects to an unborn child. Talk to your health care professional or pharmacist for more information. Do not breast-feed an infant while taking this medicine. What side effects may I notice from receiving  this medicine? Side effects that you should report to your doctor or health care professional as soon as possible: -allergic reactions like skin rash, itching or hives, swelling of the face, lips, or tongue -low blood counts - this medicine may decrease the number of white blood cells, red blood cells and platelets. You may be at increased risk for infections and bleeding. -signs of infection - fever or chills, cough, sore throat, pain or difficulty passing urine -signs of decreased platelets or bleeding - bruising, pinpoint red spots on the skin, black, tarry stools, blood in the urine -signs of decreased red blood cells - unusually weak or tired, fainting spells, lightheadedness -breathing problems -confused, not responsive -chest pain -fast, irregular heartbeat -feeling faint or lightheaded, falls -mouth sores -redness, blistering, peeling or loosening of the skin, including inside the mouth -stomach pain -swelling of the ankles, feet, or hands -trouble passing urine or change in the amount of urine Side  effects that usually do not require medical attention (report to your doctor or other health care professional if they continue or are bothersome): -anxiety -headache -loss of appetite -muscle aches -nausea -night sweats This list may not describe all possible side effects. Call your doctor for medical advice about side effects. You may report side effects to FDA at 1-800-FDA-1088. Where should I keep my medicine? This drug is given in a hospital or clinic and will not be stored at home. NOTE: This sheet is a summary. It may not cover all possible information. If you have questions about this medicine, talk to your doctor, pharmacist, or health care provider.  2012, [employer] Standard. (10/08/2007 2:04:59 PM)   Bortezomib injection What is this medicine? BORTEZOMIB (bor TEZ oh mib) is a chemotherapy drug. It slows the growth of cancer cells. This medicine is used to treat multiple myeloma, lymphoma, and other cancers. This medicine may be used for other purposes; ask your health care provider or pharmacist if you have questions. What should I tell my health care provider before I take this medicine? They need to know if you have any of these conditions: -heart disease -irregular heartbeat -liver disease -low blood counts, like low white blood cells, platelets, or hemoglobin -peripheral neuropathy -taking medicine for blood pressure -an unusual or allergic reaction to bortezomib, mannitol, boron, other medicines, foods, dyes, or preservatives -pregnant or trying to get pregnant -breast-feeding How should I use this medicine? This medicine is for injection into a vein or for injection under the skin. It is given by a health care professional in a hospital or clinic setting. Talk to your pediatrician regarding the use of this medicine in children. Special care may be needed. Overdosage: If you think you have taken too much of this medicine contact a poison control center or emergency  room at once. NOTE: This medicine is only for you. Do not share this medicine with others. What if I miss a dose? It is important not to miss your dose. Call your doctor or health care professional if you are unable to keep an appointment. What may interact with this medicine? -medicines for diabetes -medicines to increase blood counts like filgrastim, pegfilgrastim, sargramostim -zalcitabine Talk to your doctor or health care professional before taking any of these medicines: -acetaminophen -aspirin -ibuprofen -ketoprofen -naproxen This list may not describe all possible interactions. Give your health care provider a list of all the medicines, herbs, non-prescription drugs, or dietary supplements you use. Also tell them if you smoke, drink alcohol, or use illegal drugs.  Some items may interact with your medicine. What should I watch for while using this medicine? Visit your doctor for checks on your progress. This drug may make you feel generally unwell. This is not uncommon, as chemotherapy can affect healthy cells as well as cancer cells. Report any side effects. Continue your course of treatment even though you feel ill unless your doctor tells you to stop. You may get drowsy or dizzy. Do not drive, use machinery, or do anything that needs mental alertness until you know how this medicine affects you. Do not stand or sit up quickly, especially if you are an older patient. This reduces the risk of dizzy or fainting spells. In some cases, you may be given additional medicines to help with side effects. Follow all directions for their use. Call your doctor or health care professional for advice if you get a fever, chills or sore throat, or other symptoms of a cold or flu. Do not treat yourself. This drug decreases your body's ability to fight infections. Try to avoid being around people who are sick. This medicine may increase your risk to bruise or bleed. Call your doctor or health care  professional if you notice any unusual bleeding. Be careful brushing and flossing your teeth or using a toothpick because you may get an infection or bleed more easily. If you have any dental work done, tell your dentist you are receiving this medicine. Avoid taking products that contain aspirin, acetaminophen, ibuprofen, naproxen, or ketoprofen unless instructed by your doctor. These medicines may hide a fever. Do not become pregnant while taking this medicine. Women should inform their doctor if they wish to become pregnant or think they might be pregnant. There is a potential for serious side effects to an unborn child. Talk to your health care professional or pharmacist for more information. Do not breast-feed an infant while taking this medicine. You may have vomiting or diarrhea while taking this medicine. Drink water or other fluids as directed. What side effects may I notice from receiving this medicine? Side effects that you should report to your doctor or health care professional as soon as possible: -allergic reactions like skin rash, itching or hives, swelling of the face, lips, or tongue -breathing problems -changes in hearing -changes in vision -fast, irregular heartbeat -feeling faint or lightheaded, falls -pain, tingling, numbness in the hands or feet -seizures -swelling of the ankles, feet, hands -unusual bleeding or bruising -unusually weak or tired -vomiting Side effects that usually do not require medical attention (report to your doctor or health care professional if they continue or are bothersome): -changes in emotions or moods -constipation -diarrhea -loss of appetite -headache -irritation at site where injected -nausea This list may not describe all possible side effects. Call your doctor for medical advice about side effects. You may report side effects to FDA at 1-800-FDA-1088. Where should I keep my medicine? This drug is given in a hospital or clinic and will  not be stored at home. NOTE: This sheet is a summary. It may not cover all possible information. If you have questions about this medicine, talk to your doctor, pharmacist, or health care provider.  2012, Elsevier/Gold Standard. (03/17/2010 11:42:36 AM)

## 2012-01-02 NOTE — Progress Notes (Signed)
This office note has been dictated.

## 2012-01-03 NOTE — Progress Notes (Signed)
DIAGNOSIS:  Low-grade non-Hodgkin lymphoma.  CURRENT THERAPY:  The patient is status post I think 3 cycles of R-CVP.  INTERIM HISTORY:  Ms. Fuquay comes in for followup.  She is doing great. She feels wonderful.  The chemotherapy has helped with respect to her hemolytic anemia.  She is not hemolyzing any longer.  Her hemoglobin is back up to normal level.  She is clearly a testimony for God.  She enjoys being a testimony at church or wherever she is.  She has had no problems with fatigue or weakness.  There has been  no problem with bowels or bladder.  She has had no mouth sores.  There has been no leg swelling.  She has had no rashes.  Her appetite has been doing real well.  She is getting ready to enjoy Thanksgiving.  PHYSICAL EXAMINATION:  General:  This is a well-developed, well- nourished black female in no obvious distress.  Vital signs: Temperature of 97.4, pulse 62, respiratory rate 18, blood pressure 132/50.  Weight is 151.  Head and neck:  Normocephalic, atraumatic skull.  There are no ocular or oral lesions.  There are no palpable cervical or supraclavicular lymph nodes.  Lungs:  Clear bilaterally. Cardiac:  Regular rate and rhythm with a normal S1 and S2.  There are no murmurs, rubs, or bruits.  Abdomen:  Soft with good bowel sounds.  There is no palpable abdominal mass.  There is no palpable hepatosplenomegaly. Extremities:  No clubbing, cyanosis, or edema.  Neurological:  No focal neurological deficits.  LABORATORY STUDIES:  White cell count is 2.5, hemoglobin 13.9, hematocrit 39.4, and platelet count is 113.  IMPRESSION:  Ms. Tarazon is a very nice 78 year old African American female with low-grade non-Hodgkin lymphoma.  This was diagnosed with a bone marrow biopsy.  She initially presented with severe hemolysis.  For now, we will continue with treatment.  She is responding by resolution of the hemolysis.  We will see back in another 3-4 weeks.  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   ______________________________ [name], M.D. PRE/MEDQ  D:  01/02/2012  T:  01/03/2012  Job:  [id number]

## 2012-01-30 ENCOUNTER — Ambulatory Visit (HOSPITAL_BASED_OUTPATIENT_CLINIC_OR_DEPARTMENT_OTHER): Payer: Medicare Other

## 2012-01-30 ENCOUNTER — Ambulatory Visit (HOSPITAL_BASED_OUTPATIENT_CLINIC_OR_DEPARTMENT_OTHER): Payer: Medicare Other | Admitting: Medical

## 2012-01-30 ENCOUNTER — Other Ambulatory Visit (HOSPITAL_BASED_OUTPATIENT_CLINIC_OR_DEPARTMENT_OTHER): Payer: Medicare Other | Admitting: Lab

## 2012-01-30 VITALS — BP 112/63 | HR 73 | Temp 97.0°F | Resp 18

## 2012-01-30 VITALS — BP 112/60 | HR 75 | Temp 98.0°F | Resp 18 | Ht 61.0 in | Wt 155.0 lb

## 2012-01-30 DIAGNOSIS — Z5112 Encounter for antineoplastic immunotherapy: Secondary | ICD-10-CM

## 2012-01-30 DIAGNOSIS — C8307 Small cell B-cell lymphoma, spleen: Secondary | ICD-10-CM

## 2012-01-30 DIAGNOSIS — D599 Acquired hemolytic anemia, unspecified: Secondary | ICD-10-CM

## 2012-01-30 DIAGNOSIS — C8590 Non-Hodgkin lymphoma, unspecified, unspecified site: Secondary | ICD-10-CM

## 2012-01-30 DIAGNOSIS — L299 Pruritus, unspecified: Secondary | ICD-10-CM

## 2012-01-30 LAB — LACTATE DEHYDROGENASE: LDH: 222 U/L (ref 94–250)

## 2012-01-30 LAB — COMPREHENSIVE METABOLIC PANEL
ALT: 30 U/L (ref 0–35)
CO2: 27 mEq/L (ref 19–32)
Calcium: 8.6 mg/dL (ref 8.4–10.5)
Chloride: 103 mEq/L (ref 96–112)
Sodium: 140 mEq/L (ref 135–145)
Total Protein: 5.9 g/dL — ABNORMAL LOW (ref 6.0–8.3)

## 2012-01-30 LAB — CBC WITH DIFFERENTIAL (CANCER CENTER ONLY)
BASO%: 0.3 % (ref 0.0–2.0)
LYMPH#: 0.6 10*3/uL — ABNORMAL LOW (ref 0.9–3.3)
MONO#: 0.6 10*3/uL (ref 0.1–0.9)
NEUT#: 2.3 10*3/uL (ref 1.5–6.5)
Platelets: 114 10*3/uL — ABNORMAL LOW (ref 145–400)
RDW: 14.5 % (ref 11.1–15.7)
WBC: 3.6 10*3/uL — ABNORMAL LOW (ref 3.9–10.0)

## 2012-01-30 LAB — TECHNOLOGIST REVIEW CHCC SATELLITE

## 2012-01-30 MED ORDER — DIPHENHYDRAMINE HCL 25 MG PO CAPS
50.0000 mg | ORAL_CAPSULE | Freq: Once | ORAL | Status: AC
  Administered 2012-01-30: 50 mg via ORAL

## 2012-01-30 MED ORDER — DEXAMETHASONE SODIUM PHOSPHATE 4 MG/ML IJ SOLN
20.0000 mg | Freq: Once | INTRAMUSCULAR | Status: AC
  Administered 2012-01-30: 20 mg via INTRAVENOUS

## 2012-01-30 MED ORDER — SODIUM CHLORIDE 0.9 % IV SOLN
800.0000 mg/m2 | Freq: Once | INTRAVENOUS | Status: AC
  Administered 2012-01-30: 1340 mg via INTRAVENOUS
  Filled 2012-01-30: qty 67

## 2012-01-30 MED ORDER — ONDANSETRON 16 MG/50ML IVPB (CHCC)
16.0000 mg | Freq: Once | INTRAVENOUS | Status: AC
  Administered 2012-01-30: 16 mg via INTRAVENOUS

## 2012-01-30 MED ORDER — SODIUM CHLORIDE 0.9 % IJ SOLN
10.0000 mL | INTRAMUSCULAR | Status: DC | PRN
  Administered 2012-01-30: 10 mL
  Filled 2012-01-30: qty 10

## 2012-01-30 MED ORDER — SODIUM CHLORIDE 0.9 % IV SOLN
Freq: Once | INTRAVENOUS | Status: AC
  Administered 2012-01-30: 11:00:00 via INTRAVENOUS

## 2012-01-30 MED ORDER — ACETAMINOPHEN 325 MG PO TABS
650.0000 mg | ORAL_TABLET | Freq: Once | ORAL | Status: AC
  Administered 2012-01-30: 650 mg via ORAL

## 2012-01-30 MED ORDER — SODIUM CHLORIDE 0.9 % IV SOLN
375.0000 mg/m2 | Freq: Once | INTRAVENOUS | Status: AC
  Administered 2012-01-30: 600 mg via INTRAVENOUS
  Filled 2012-01-30: qty 60

## 2012-01-30 MED ORDER — HEPARIN SOD (PORK) LOCK FLUSH 100 UNIT/ML IV SOLN
500.0000 [IU] | Freq: Once | INTRAVENOUS | Status: AC | PRN
  Administered 2012-01-30: 500 [IU]
  Filled 2012-01-30: qty 5

## 2012-01-30 MED ORDER — VINCRISTINE SULFATE CHEMO INJECTION 1 MG/ML
1.5000 mg | Freq: Once | INTRAVENOUS | Status: AC
  Administered 2012-01-30: 1.5 mg via INTRAVENOUS
  Filled 2012-01-30: qty 1.5

## 2012-01-30 NOTE — Patient Instructions (Signed)
[facility] Discharge Instructions for Patients Receiving Chemotherapy  Today you received the following chemotherapy agents Rituxan and Treanda  Take home medications  To help prevent nausea and vomiting after your treatment, we encourage you to take your nausea medication Compazine 10 mg by mouth every 6 hours as needed for nausea or vomiting If you develop nausea and vomiting that is not controlled by your nausea medication, call the clinic. If it is after clinic hours your family physician or the after hours number for the clinic or go to the Emergency Department.   BELOW ARE SYMPTOMS THAT SHOULD BE REPORTED IMMEDIATELY:  *FEVER GREATER THAN 100.5 F  *CHILLS WITH OR WITHOUT FEVER  NAUSEA AND VOMITING THAT IS NOT CONTROLLED WITH YOUR NAUSEA MEDICATION  *UNUSUAL SHORTNESS OF BREATH  *UNUSUAL BRUISING OR BLEEDING  TENDERNESS IN MOUTH AND THROAT WITH OR WITHOUT PRESENCE OF ULCERS  *URINARY PROBLEMS  *BOWEL PROBLEMS  UNUSUAL RASH Items with * indicate a potential emergency and should be followed up as soon as possible.  One of the nurses will contact you 24 hours after your treatment. Please let the nurse know about any problems that you may have experienced. Feel free to call the clinic you have any questions or concerns. The clinic phone number is [phone]   I have been informed and understand all the instructions given to me. I know to contact the clinic, my physician, or go to the Emergency Department if any problems should occur. I do not have any questions at this time, but understand that I may call the clinic during office hours   should I have any questions or need assistance in obtaining follow up care.    __________________________________________  _____________  __________ Signature of Patient or Authorized Representative            Date                   Time    __________________________________________ Nurse's  Signature       Rituximab injection What is this medicine? RITUXIMAB (ri TUX i mab) is a monoclonal antibody. This medicine changes the way the body's immune system works. It is used commonly to treat non-Hodgkin's lymphoma and other conditions. In cancer cells, this drug targets a specific protein within cancer cells and stops the cancer cells from growing. It is also used to treat rhuematoid arthritis (RA). In RA, this medicine slow the inflammatory process and help reduce joint pain and swelling. This medicine is often used with other cancer or arthritis medications. This medicine may be used for other purposes; ask your health care provider or pharmacist if you have questions. What should I tell my health care provider before I take this medicine? They need to know if you have any of these conditions: -blood disorders -heart disease -history of hepatitis B -infection (especially a virus infection such as chickenpox, cold sores, or herpes) -irregular heartbeat -kidney disease -lung or breathing disease, like asthma -lupus -an unusual or allergic reaction to rituximab, mouse proteins, other medicines, foods, dyes, or preservatives -pregnant or trying to get pregnant -breast-feeding How should I use this medicine? This medicine is for infusion into a vein. It is administered in a hospital or clinic by a specially trained health care professional. A special MedGuide will be given to you by the pharmacist with each prescription and refill. Be sure to read this information carefully each time. Talk to your pediatrician regarding the use of this medicine in  children. This medicine is not approved for use in children. Overdosage: If you think you have taken too much of this medicine contact a poison control center or emergency room at once. NOTE: This medicine is only for you. Do not share this medicine with others. What if I miss a dose? It is important not to miss a dose. Call your  doctor or health care professional if you are unable to keep an appointment. What may interact with this medicine? -cisplatin -medicines for blood pressure -some other medicines for arthritis -vaccines This list may not describe all possible interactions. Give your health care provider a list of all the medicines, herbs, non-prescription drugs, or dietary supplements you use. Also tell them if you smoke, drink alcohol, or use illegal drugs. Some items may interact with your medicine. What should I watch for while using this medicine? Report any side effects that you notice during your treatment right away, such as changes in your breathing, fever, chills, dizziness or lightheadedness. These effects are more common with the first dose. Visit your prescriber or health care professional for checks on your progress. You will need to have regular blood work. Report any other side effects. The side effects of this medicine can continue after you finish your treatment. Continue your course of treatment even though you feel ill unless your doctor tells you to stop. Call your doctor or health care professional for advice if you get a fever, chills or sore throat, or other symptoms of a cold or flu. Do not treat yourself. This drug decreases your body's ability to fight infections. Try to avoid being around people who are sick. This medicine may increase your risk to bruise or bleed. Call your doctor or health care professional if you notice any unusual bleeding. Be careful brushing and flossing your teeth or using a toothpick because you may get an infection or bleed more easily. If you have any dental work done, tell your dentist you are receiving this medicine. Avoid taking products that contain aspirin, acetaminophen, ibuprofen, naproxen, or ketoprofen unless instructed by your doctor. These medicines may hide a fever. Do not become pregnant while taking this medicine. Women should inform their doctor if  they wish to become pregnant or think they might be pregnant. There is a potential for serious side effects to an unborn child. Talk to your health care professional or pharmacist for more information. Do not breast-feed an infant while taking this medicine. What side effects may I notice from receiving this medicine? Side effects that you should report to your doctor or health care professional as soon as possible: -allergic reactions like skin rash, itching or hives, swelling of the face, lips, or tongue -low blood counts - this medicine may decrease the number of white blood cells, red blood cells and platelets. You may be at increased risk for infections and bleeding. -signs of infection - fever or chills, cough, sore throat, pain or difficulty passing urine -signs of decreased platelets or bleeding - bruising, pinpoint red spots on the skin, black, tarry stools, blood in the urine -signs of decreased red blood cells - unusually weak or tired, fainting spells, lightheadedness -breathing problems -confused, not responsive -chest pain -fast, irregular heartbeat -feeling faint or lightheaded, falls -mouth sores -redness, blistering, peeling or loosening of the skin, including inside the mouth -stomach pain -swelling of the ankles, feet, or hands -trouble passing urine or change in the amount of urine Side effects that usually do not require medical attention (  report to your doctor or other health care professional if they continue or are bothersome): -anxiety -headache -loss of appetite -muscle aches -nausea -night sweats This list may not describe all possible side effects. Call your doctor for medical advice about side effects. You may report side effects to FDA at 1-800-FDA-1088. Where should I keep my medicine? This drug is given in a hospital or clinic and will not be stored at home.   Cyclophosphamide injection What is this medicine? CYCLOPHOSPHAMIDE (sye kloe FOSS fa mide) is  a chemotherapy drug. It slows the growth of cancer cells. This medicine is used to treat many types of cancer like lymphoma, myeloma, leukemia, breast cancer, and ovarian cancer, to name a few. It is also used to treat nephrotic syndrome in children. This medicine may be used for other purposes; ask your health care provider or pharmacist if you have questions. What should I tell my health care provider before I take this medicine? They need to know if you have any of these conditions: -blood disorders -history of other chemotherapy -history of radiation therapy -infection -kidney disease -liver disease -tumors in the bone marrow -an unusual or allergic reaction to cyclophosphamide, other chemotherapy, other medicines, foods, dyes, or preservatives -pregnant or trying to get pregnant -breast-feeding How should I use this medicine? This drug is usually given as an injection into a vein or muscle or by infusion into a vein. It is administered in a hospital or clinic by a specially trained health care professional. Talk to your pediatrician regarding the use of this medicine in children. While this drug may be prescribed for selected conditions, precautions do apply. Overdosage: If you think you have taken too much of this medicine contact a poison control center or emergency room at once. NOTE: This medicine is only for you. Do not share this medicine with others. What if I miss a dose? It is important not to miss your dose. Call your doctor or health care professional if you are unable to keep an appointment. What may interact with this medicine? Do not take this medicine with any of the following medications: -mibefradil -nalidixic acid This medicine may also interact with the following medications: -doxorubicin -etanercept -medicines to increase blood counts like filgrastim, pegfilgrastim, sargramostim -medicines that block muscle or nerve pain -St. John's  Wort -phenobarbital -succinylcholine chloride -trastuzumab -vaccines Talk to your doctor or health care professional before taking any of these medicines: -acetaminophen -aspirin -ibuprofen -ketoprofen -naproxen This list may not describe all possible interactions. Give your health care provider a list of all the medicines, herbs, non-prescription drugs, or dietary supplements you use. Also tell them if you smoke, drink alcohol, or use illegal drugs. Some items may interact with your medicine. What should I watch for while using this medicine? Visit your doctor for checks on your progress. This drug may make you feel generally unwell. This is not uncommon, as chemotherapy can affect healthy cells as well as cancer cells. Report any side effects. Continue your course of treatment even though you feel ill unless your doctor tells you to stop. Drink water or other fluids as directed. Urinate often, even at night. In some cases, you may be given additional medicines to help with side effects. Follow all directions for their use. Call your doctor or health care professional for advice if you get a fever, chills or sore throat, or other symptoms of a cold or flu. Do not treat yourself. This drug decreases your body's ability to fight infections.  Try to avoid being around people who are sick. This medicine may increase your risk to bruise or bleed. Call your doctor or health care professional if you notice any unusual bleeding. Be careful brushing and flossing your teeth or using a toothpick because you may get an infection or bleed more easily. If you have any dental work done, tell your dentist you are receiving this medicine. Avoid taking products that contain aspirin, acetaminophen, ibuprofen, naproxen, or ketoprofen unless instructed by your doctor. These medicines may hide a fever. Do not become pregnant while taking this medicine. Women should inform their doctor if they wish to become pregnant  or think they might be pregnant. There is a potential for serious side effects to an unborn child. Talk to your health care professional or pharmacist for more information. Do not breast-feed an infant while taking this medicine. Men should inform their doctor if they wish to father a child. This medicine may lower sperm counts. If you are going to have surgery, tell your doctor or health care professional that you have taken this medicine. What side effects may I notice from receiving this medicine? Side effects that you should report to your doctor or health care professional as soon as possible: -allergic reactions like skin rash, itching or hives, swelling of the face, lips, or tongue -low blood counts - this medicine may decrease the number of white blood cells, red blood cells and platelets. You may be at increased risk for infections and bleeding. -signs of infection - fever or chills, cough, sore throat, pain or difficulty passing urine -signs of decreased platelets or bleeding - bruising, pinpoint red spots on the skin, black, tarry stools, blood in the urine -signs of decreased red blood cells - unusually weak or tired, fainting spells, lightheadedness -breathing problems -dark urine -mouth sores -pain, swelling, redness at site where injected -swelling of the ankles, feet, hands -trouble passing urine or change in the amount of urine -weight gain -yellowing of the eyes or skin Side effects that usually do not require medical attention (report to your doctor or health care professional if they continue or are bothersome): -changes in nail or skin color -diarrhea -hair loss -loss of appetite -missed menstrual periods -nausea, vomiting -stomach pain This list may not describe all possible side effects. Call your doctor for medical advice about side effects. You may report side effects to FDA at 1-800-FDA-1088. Where should I keep my medicine? This drug is given in a hospital or  clinic and will not be stored at home. NOTE: This sheet is a summary. It may not cover all possible information. If you have questions about this medicine, talk to your doctor, pharmacist, or health care provider.  2012, [employer] Standard. (05/15/2007 2:32:25 PM)

## 2012-01-30 NOTE — Progress Notes (Signed)
Diagnosis: Marginal Zone Lymphoma  Current therapy: The patient is status post 4 cycles of R.-CVP.  Interim history: Kristine Sullivan presents today for an office followup visit.  Overall, she continues to do, great.  The chemotherapy, is really help with respect or hemolytic anemia, as well as her anterior edema.  She's not hemolyzing, any longer.  Her hemoglobin is actually back up to normal level.  She does not report any problems with any fatigue or weakness.  She has an excellent appetite.  She denies any fevers, chills, nausea, vomiting, diarrhea, constipation, chest pain, shortness of breath, or cough.  She denies any headaches, visual changes, or rashes.  She denies any type of mucositis.  She does have some mild numbness and tingling in her fingers and toes, however, this does not interfere with her activities of daily living.  She remains on Atarax for pruritis.  She denies any lower leg swelling.  She is due for her fifth cycle of chemotherapy today.   Review of Systems: Constitutional:Negative for malaise/fatigue, fever, chills, weight loss, diaphoresis, activity change, appetite change, and unexpected weight change.  HEENT: Negative for double vision, blurred vision, visual loss, ear pain, tinnitus, congestion, rhinorrhea, epistaxis sore throat or sinus disease, oral pain/lesion, tongue soreness Respiratory: Negative for cough, chest tightness, shortness of breath, wheezing and stridor.  Cardiovascular: Negative for chest pain, palpitations, leg swelling, orthopnea, PND, DOE or claudication Gastrointestinal: Negative for nausea, vomiting, abdominal pain, diarrhea, constipation, blood in stool, melena, hematochezia, abdominal distention, anal bleeding, rectal pain, anorexia and hematemesis.  Genitourinary: Negative for dysuria, frequency, hematuria,  Musculoskeletal: Negative for myalgias, back pain, joint swelling, arthralgias and gait problem.  Skin: Negative for rash, color change, pallor and  wound.  Neurological:. Negative for dizziness/light-headedness, tremors, seizures, syncope, facial asymmetry, speech difficulty, weakness, numbness, headaches and paresthesias.  Hematological: Negative for adenopathy. Does not bruise/bleed easily.  Psychiatric/Behavioral:  Negative for depression, no loss of interest in normal activity or change in sleep pattern.   Physical Exam: This is a pleasant, 78 year old, well-developed, well-nourished, female, in no obvious distress Vitals: Temperature 90.2 degrees, pulse 75, respirations 18, blood pressure 112/60, weight 155 pounds HEENT reveals a normocephalic, atraumatic skull, no scleral icterus, no oral lesions  Neck is supple without any cervical or supraclavicular adenopathy.  Lungs are clear to auscultation bilaterally. There are no wheezes, rales or rhonci Cardiac is regular rate and rhythm with a normal S1 and S2. There are no murmurs, rubs, or bruits.  Abdomen is soft with good bowel sounds, there is no palpable mass. There is no palpable hepatosplenomegaly. There is no palpable fluid wave.  Musculoskeletal no tenderness of the spine, ribs, or hips.  Extremities there are no clubbing, cyanosis, or edema.  Skin no petechia, purpura or ecchymosis Neurologic is nonfocal.  Laboratory Data: White count of 0.6, hemoglobin 13.5, hematocrit 39.7, platelets 114,000  Current Outpatient Prescriptions on File Prior to Visit  Medication Sig Dispense Refill  . amLODipine (NORVASC) 10 MG tablet Take 10 mg by mouth at bedtime.       [name] aspirin 325 MG tablet Take 325 mg by mouth daily.      . danazol (DANOCRINE) 100 MG capsule Take 100 mg by mouth daily. For angioedema      . fish oil-omega-3 fatty acids 1000 MG capsule Take 1 g by mouth daily.       . folic acid (FOLVITE) 1 MG tablet Take 1 mg by mouth every morning.      . furosemide (  LASIX) 40 MG tablet Take 40 mg by mouth daily.      . hydrOXYzine (ATARAX/VISTARIL) 25 MG tablet Take 25 mg by mouth at  bedtime as needed. For itching      . lidocaine-prilocaine (EMLA) cream Apply topically as needed. Apply to portacath site 1-2 hours prior to chemotherapy.  30 g  0  . lovastatin (MEVACOR) 40 MG tablet Take 40 mg by mouth at bedtime.       . metFORMIN (GLUCOPHAGE) 500 MG tablet Take 500 mg by mouth daily with breakfast.      . ondansetron (ZOFRAN) 8 MG tablet Take 1 tablet two times a day starting the day after chemo for 3 days. Then take 1 tablet two times a day as needed for nausea or vomiting.  30 tablet  1  . potassium chloride (KLOR-CON 10) 10 MEQ tablet Take 10 mEq by mouth every morning.      . prochlorperazine (COMPAZINE) 10 MG tablet Take 1 tablet (10 mg total) by mouth every 6 (six) hours as needed (Nausea or vomiting).  30 tablet  1  . timolol (TIMOPTIC) 0.5 % ophthalmic solution Place 1 drop into both eyes 2 (two) times daily.       . Travoprost, BAK Free, (TRAVATAN) 0.004 % SOLN ophthalmic solution Place 1 drop into both eyes at bedtime.      . triamterene-hydrochlorothiazide (DYAZIDE) 37.5-25 MG per capsule Take 1 capsule by mouth every morning.        Current Facility-Administered Medications on File Prior to Visit  Medication Dose Route Frequency Provider Last Rate Last Dose  . 0.9 %  sodium chloride infusion  250 mL Intravenous Once [name], MD       Assessment/Plan: This is a pleasant, 78 year old, African American female, with the following issues:  #1.  Marginal Zone Lymphoma.  This was diagnosed with a bone marrow, biopsy.  She initially presented with severe homolysis.  She is responding by resolution of the homolysis.  She is having very minimal toxicity related to the chemotherapy.  She will go ahead and proceed with her fifth cycle today.   #2.  Pruritus.  She will continue on her Atarax.  I did recommend Benadryl as well.  #3.  Followup.  We will follow back up with Kristine Sullivan in another 3-4 weeks, but before then should there be questions or concerns.

## 2012-02-02 ENCOUNTER — Encounter (HOSPITAL_COMMUNITY)
Admission: RE | Admit: 2012-02-02 | Discharge: 2012-02-02 | Disposition: A | Discharge status: Home / Self Care | Payer: Medicare Other | Source: Ambulatory Visit | Attending: Hematology & Oncology | Admitting: Hematology & Oncology

## 2012-02-02 DIAGNOSIS — D599 Acquired hemolytic anemia, unspecified: Secondary | ICD-10-CM | POA: Insufficient documentation

## 2012-02-27 ENCOUNTER — Ambulatory Visit (HOSPITAL_BASED_OUTPATIENT_CLINIC_OR_DEPARTMENT_OTHER): Payer: Medicare Other | Admitting: Hematology & Oncology

## 2012-02-27 ENCOUNTER — Ambulatory Visit (HOSPITAL_BASED_OUTPATIENT_CLINIC_OR_DEPARTMENT_OTHER): Payer: Medicaid Other

## 2012-02-27 ENCOUNTER — Ambulatory Visit (HOSPITAL_BASED_OUTPATIENT_CLINIC_OR_DEPARTMENT_OTHER)
Admission: RE | Admit: 2012-02-27 | Discharge: 2012-02-27 | Disposition: A | Discharge status: Home / Self Care | Payer: Medicare Other | Source: Ambulatory Visit | Attending: Hematology & Oncology | Admitting: Hematology & Oncology

## 2012-02-27 ENCOUNTER — Other Ambulatory Visit (HOSPITAL_BASED_OUTPATIENT_CLINIC_OR_DEPARTMENT_OTHER): Payer: Medicare Other | Admitting: Lab

## 2012-02-27 VITALS — BP 99/59 | HR 73 | Temp 96.9°F | Resp 18

## 2012-02-27 VITALS — BP 136/50 | HR 77 | Temp 97.4°F | Resp 16 | Ht 61.0 in | Wt 161.0 lb

## 2012-02-27 DIAGNOSIS — Z5112 Encounter for antineoplastic immunotherapy: Secondary | ICD-10-CM

## 2012-02-27 DIAGNOSIS — Z5111 Encounter for antineoplastic chemotherapy: Secondary | ICD-10-CM

## 2012-02-27 DIAGNOSIS — M25519 Pain in unspecified shoulder: Secondary | ICD-10-CM | POA: Insufficient documentation

## 2012-02-27 DIAGNOSIS — M25511 Pain in right shoulder: Secondary | ICD-10-CM

## 2012-02-27 DIAGNOSIS — C8307 Small cell B-cell lymphoma, spleen: Secondary | ICD-10-CM

## 2012-02-27 LAB — BASIC METABOLIC PANEL
BUN: 16 mg/dL (ref 6–23)
CO2: 27 mEq/L (ref 19–32)
Chloride: 104 mEq/L (ref 96–112)
Creatinine, Ser: 0.76 mg/dL (ref 0.50–1.10)
Potassium: 4 mEq/L (ref 3.5–5.3)

## 2012-02-27 LAB — CBC WITH DIFFERENTIAL (CANCER CENTER ONLY)
BASO%: 0.5 % (ref 0.0–2.0)
Eosinophils Absolute: 0.1 10*3/uL (ref 0.0–0.5)
HCT: 39.8 % (ref 34.8–46.6)
LYMPH#: 0.6 10*3/uL — ABNORMAL LOW (ref 0.9–3.3)
LYMPH%: 14.1 % (ref 14.0–48.0)
MCV: 83 fL (ref 81–101)
MONO#: 0.7 10*3/uL (ref 0.1–0.9)
NEUT%: 65.9 % (ref 39.6–80.0)
RBC: 4.82 10*6/uL (ref 3.70–5.32)
RDW: 15.8 % — ABNORMAL HIGH (ref 11.1–15.7)
WBC: 3.9 10*3/uL (ref 3.9–10.0)

## 2012-02-27 LAB — LACTATE DEHYDROGENASE: LDH: 211 U/L (ref 94–250)

## 2012-02-27 LAB — RETICULOCYTES (CHCC)
ABS Retic: 78 10*3/uL (ref 19.0–186.0)
RBC.: 4.59 MIL/uL (ref 3.87–5.11)

## 2012-02-27 MED ORDER — SODIUM CHLORIDE 0.9 % IV SOLN
Freq: Once | INTRAVENOUS | Status: AC
  Administered 2012-02-27: 10:00:00 via INTRAVENOUS

## 2012-02-27 MED ORDER — ACETAMINOPHEN 325 MG PO TABS
650.0000 mg | ORAL_TABLET | Freq: Once | ORAL | Status: AC
  Administered 2012-02-27: 650 mg via ORAL

## 2012-02-27 MED ORDER — HEPARIN SOD (PORK) LOCK FLUSH 100 UNIT/ML IV SOLN
500.0000 [IU] | Freq: Once | INTRAVENOUS | Status: AC | PRN
  Administered 2012-02-27: 500 [IU]
  Filled 2012-02-27: qty 5

## 2012-02-27 MED ORDER — ONDANSETRON 16 MG/50ML IVPB (CHCC)
16.0000 mg | Freq: Once | INTRAVENOUS | Status: AC
  Administered 2012-02-27: 16 mg via INTRAVENOUS

## 2012-02-27 MED ORDER — DEXAMETHASONE SODIUM PHOSPHATE 10 MG/ML IJ SOLN
20.0000 mg | Freq: Once | INTRAMUSCULAR | Status: AC
  Administered 2012-02-27: 20 mg via INTRAVENOUS

## 2012-02-27 MED ORDER — SODIUM CHLORIDE 0.9 % IV SOLN
800.0000 mg/m2 | Freq: Once | INTRAVENOUS | Status: AC
  Administered 2012-02-27: 1340 mg via INTRAVENOUS
  Filled 2012-02-27: qty 67

## 2012-02-27 MED ORDER — VINCRISTINE SULFATE CHEMO INJECTION 1 MG/ML
1.5000 mg | Freq: Once | INTRAVENOUS | Status: AC
  Administered 2012-02-27: 1.5 mg via INTRAVENOUS
  Filled 2012-02-27: qty 1.5

## 2012-02-27 MED ORDER — SODIUM CHLORIDE 0.9 % IV SOLN
375.0000 mg/m2 | Freq: Once | INTRAVENOUS | Status: AC
  Administered 2012-02-27: 600 mg via INTRAVENOUS
  Filled 2012-02-27: qty 60

## 2012-02-27 MED ORDER — DIPHENHYDRAMINE HCL 25 MG PO CAPS
50.0000 mg | ORAL_CAPSULE | Freq: Once | ORAL | Status: AC
  Administered 2012-02-27: 50 mg via ORAL

## 2012-02-27 MED ORDER — SODIUM CHLORIDE 0.9 % IJ SOLN
10.0000 mL | INTRAMUSCULAR | Status: DC | PRN
Start: 2012-02-27 — End: 2012-02-27
  Administered 2012-02-27: 10 mL
  Filled 2012-02-27: qty 10

## 2012-02-27 NOTE — Patient Instructions (Signed)
[facility] Discharge Instructions for Patients Receiving Chemotherapy  Today you received the following chemotherapy agents Cytoxan, Vincristine, (Prednisone to start at home).  Prednisone 60 mg - take 3 tabs daily starting the day after chemo for 5 days. Repeat with each new cycle of chemo.  To help prevent nausea and vomiting after your treatment, we encourage you to take your nausea medication:  Zofran 8mg  - 1 tab every 12 hours for 3 days after chemo,  then every 12 hours as needed for nausea or vomiting;  Phenergan 12.5 mg - take 1 or 2 every 6 hours as needed for nausea  Ativan 0.5 mg - place 1 tab under your tongue every 8 hours as needed for nausea, vomiting, anxiety, or sleep.  Other helpful medications Colace - this is a stool softener. Take 100mg  capsule (2) 1-2 times a day as needed. If you have to take more than 6 capsules of Colace a day call the Cancer Center. Milk of Magnesia - this is a laxative used to treat moderate to severe constipation. May take 2-4 tablespoons every 8 hours as needed. May increase to 8 tablespoons x 1 dose and if no bowel movement call the Cancer Center  Medication to numb your Port: EMLA Cream Apply quarter size application to [place] site 1 hour prior to chemotherapy. Do NOT rub in. Cover area with plastic wrap.    If you develop nausea and vomiting that is not controlled by your nausea medication, call the clinic. If it is after clinic hours your family physician or the after hours number for the clinic or go to the Emergency Department.   BELOW ARE SYMPTOMS THAT SHOULD BE REPORTED IMMEDIATELY:  *FEVER GREATER THAN 100.5 F  *CHILLS WITH OR WITHOUT FEVER  NAUSEA AND VOMITING THAT IS NOT CONTROLLED WITH YOUR NAUSEA MEDICATION  *UNUSUAL SHORTNESS OF BREATH  *UNUSUAL BRUISING OR BLEEDING  TENDERNESS IN MOUTH AND THROAT WITH OR WITHOUT PRESENCE OF ULCERS  *URINARY PROBLEMS  *BOWEL PROBLEMS  UNUSUAL RASH Items with * indicate  a potential emergency and should be followed up as soon as possible.  One of the nurses will contact you 24 hours after your treatment. Please let the nurse know about any problems that you may have experienced. Feel free to call the clinic you have any questions or concerns. The clinic phone number is [phone].   I have been informed and understand all the instructions given to me. I know to contact the clinic, my physician, or go to the Emergency Department if any problems should occur. I do not have any questions at this time, but understand that I may call the clinic during office hours   should I have any questions or need assistance in obtaining follow up care.    __________________________________________  _____________  __________ Signature of Patient or Authorized Representative            Date                   Time    __________________________________________ Nurse's Signature

## 2012-02-27 NOTE — Progress Notes (Signed)
This office note has been dictated.

## 2012-02-28 ENCOUNTER — Encounter (HOSPITAL_COMMUNITY)
Admission: RE | Admit: 2012-02-28 | Discharge: 2012-02-28 | Disposition: A | Discharge status: Home / Self Care | Payer: Medicare Other | Source: Ambulatory Visit | Attending: Hematology & Oncology | Admitting: Hematology & Oncology

## 2012-02-28 DIAGNOSIS — D599 Acquired hemolytic anemia, unspecified: Secondary | ICD-10-CM | POA: Insufficient documentation

## 2012-02-28 NOTE — Progress Notes (Signed)
CC:   Kristine Level, MD  DIAGNOSIS:  Marginal zone lymphoma.  CURRENT THERAPY:  The patient is status post 5 cycles of R-CVP.  INTERIM HISTORY:  Kristine Sullivan comes in for her followup.  She is really doing quite well.  She has tolerated chemotherapy quite nicely.  She has not had any problems with nausea or vomiting.  She enjoyed the Christmas holidays.  She is not fatigued.  She has had no issues with her angioedema.  She does complain of some pain in the right shoulder. There is decreased range of motion of the right shoulder.  We will get an x-ray of the shoulder.  PHYSICAL EXAMINATION:  General:  This is a well-developed, well- nourished, black female in no obvious distress.  Vital signs:  Vital signs:  Temperature of 97.4, pulse 77, respiratory rate 16, blood pressure 136/50.  Weight is 161.  Head and neck:  Normocephalic, atraumatic skull.  There are no ocular or oral lesions.  There are no palpable cervical or supraclavicular lymph nodes.  Lungs:  Clear to percussion and auscultation bilaterally.  Cardiac:  Regular rate and rhythm with normal S1 and S2.  There are no murmurs, rubs or bruits. Abdomen:  Soft with good bowel sounds.  There is no palpable abdominal mass.  No palpable hepatosplenomegaly.  Extremities:  Shows no clubbing, cyanosis or edema.  She does have decreased range of motion of the right shoulder.  LABORATORY STUDIES:  White cell count 3.9, hemoglobin 13.2, hematocrit 39.8, platelet count is 104.  IMPRESSION:  Kristine Sullivan is a very charming, 79 year old black female with marginal zone lymphoma.  She presented with severe hemolytic anemia.  She also has this history of angioedema.  Since she started chemo therapy, everything has resolved.  We will go ahead with her 6th cycle today.  I think we will plan for a total of 8 cycles.  I think she may benefit from maintenance Rituxan.  We will plan to get her back in another 3  weeks.    ______________________________ [name], M.D. PRE/MEDQ  D:  02/27/2012  T:  02/28/2012  Job:  [id number]

## 2012-02-29 ENCOUNTER — Telehealth: Payer: Self-pay | Admitting: *Deleted

## 2012-02-29 NOTE — Telephone Encounter (Signed)
Message copied by Anselm Jungling on Wed Feb 29, 2012 10:55 AM ------      Message from: Arlan Organ R      Created: Tue Feb 28, 2012  2:00 PM       CALL - SHOULDER XRAY SHOWS ARTHRITIS.  NEEDS TO SEE HER FAMILY MD FOR THIS. MAY NEED ORTHO REFERRAL, BUT FAMILY MD SHOULD DO THIS.  PETE

## 2012-02-29 NOTE — Telephone Encounter (Signed)
Called patient to let her know that her shoulder xray showed that she has some arthritis Advised her to see family practice doctor to have this evaluated.  Patient states she will do this

## 2012-03-19 ENCOUNTER — Other Ambulatory Visit: Payer: Self-pay | Admitting: Medical

## 2012-03-19 ENCOUNTER — Ambulatory Visit (HOSPITAL_BASED_OUTPATIENT_CLINIC_OR_DEPARTMENT_OTHER): Payer: Medicaid Other

## 2012-03-19 ENCOUNTER — Other Ambulatory Visit (HOSPITAL_BASED_OUTPATIENT_CLINIC_OR_DEPARTMENT_OTHER): Payer: Medicaid Other | Admitting: Lab

## 2012-03-19 ENCOUNTER — Ambulatory Visit (HOSPITAL_BASED_OUTPATIENT_CLINIC_OR_DEPARTMENT_OTHER): Payer: Medicare Other | Admitting: Medical

## 2012-03-19 VITALS — BP 119/64 | HR 75 | Temp 97.3°F | Resp 18

## 2012-03-19 VITALS — BP 122/51 | HR 74 | Temp 97.8°F | Resp 16 | Ht 61.0 in | Wt 167.0 lb

## 2012-03-19 DIAGNOSIS — C8307 Small cell B-cell lymphoma, spleen: Secondary | ICD-10-CM

## 2012-03-19 DIAGNOSIS — Z5112 Encounter for antineoplastic immunotherapy: Secondary | ICD-10-CM

## 2012-03-19 LAB — COMPREHENSIVE METABOLIC PANEL
ALT: 21 U/L (ref 0–35)
BUN: 16 mg/dL (ref 6–23)
CO2: 28 mEq/L (ref 19–32)
Calcium: 8.8 mg/dL (ref 8.4–10.5)
Chloride: 104 mEq/L (ref 96–112)
Creatinine, Ser: 0.93 mg/dL (ref 0.50–1.10)
Glucose, Bld: 122 mg/dL — ABNORMAL HIGH (ref 70–99)

## 2012-03-19 LAB — CBC WITH DIFFERENTIAL (CANCER CENTER ONLY)
BASO#: 0 10*3/uL (ref 0.0–0.2)
Eosinophils Absolute: 0.1 10*3/uL (ref 0.0–0.5)
HCT: 38.9 % (ref 34.8–46.6)
HGB: 13 g/dL (ref 11.6–15.9)
LYMPH#: 0.6 10*3/uL — ABNORMAL LOW (ref 0.9–3.3)
LYMPH%: 14.8 % (ref 14.0–48.0)
MCV: 82 fL (ref 81–101)
MONO#: 0.7 10*3/uL (ref 0.1–0.9)
NEUT%: 67.3 % (ref 39.6–80.0)
WBC: 4.3 10*3/uL (ref 3.9–10.0)

## 2012-03-19 LAB — LACTATE DEHYDROGENASE: LDH: 227 U/L (ref 94–250)

## 2012-03-19 LAB — TECHNOLOGIST REVIEW CHCC SATELLITE

## 2012-03-19 MED ORDER — SODIUM CHLORIDE 0.9 % IV SOLN
800.0000 mg/m2 | Freq: Once | INTRAVENOUS | Status: AC
  Administered 2012-03-19: 1340 mg via INTRAVENOUS
  Filled 2012-03-19: qty 67

## 2012-03-19 MED ORDER — DEXAMETHASONE SODIUM PHOSPHATE 4 MG/ML IJ SOLN
20.0000 mg | Freq: Once | INTRAMUSCULAR | Status: AC
  Administered 2012-03-19: 20 mg via INTRAVENOUS

## 2012-03-19 MED ORDER — ALTEPLASE 2 MG IJ SOLR
2.0000 mg | Freq: Once | INTRAMUSCULAR | Status: DC | PRN
  Filled 2012-03-19: qty 2

## 2012-03-19 MED ORDER — SODIUM CHLORIDE 0.9 % IJ SOLN
3.0000 mL | INTRAMUSCULAR | Status: DC | PRN
  Filled 2012-03-19: qty 10

## 2012-03-19 MED ORDER — SODIUM CHLORIDE 0.9 % IV SOLN
Freq: Once | INTRAVENOUS | Status: AC
  Administered 2012-03-19: 09:00:00 via INTRAVENOUS

## 2012-03-19 MED ORDER — SODIUM CHLORIDE 0.9 % IV SOLN
700.0000 mg | Freq: Once | INTRAVENOUS | Status: AC
  Administered 2012-03-19: 700 mg via INTRAVENOUS
  Filled 2012-03-19: qty 70

## 2012-03-19 MED ORDER — HEPARIN SOD (PORK) LOCK FLUSH 100 UNIT/ML IV SOLN
250.0000 [IU] | Freq: Once | INTRAVENOUS | Status: DC | PRN
  Filled 2012-03-19: qty 5

## 2012-03-19 MED ORDER — HEPARIN SOD (PORK) LOCK FLUSH 100 UNIT/ML IV SOLN
500.0000 [IU] | Freq: Once | INTRAVENOUS | Status: AC | PRN
  Administered 2012-03-19: 500 [IU]
  Filled 2012-03-19: qty 5

## 2012-03-19 MED ORDER — VINCRISTINE SULFATE CHEMO INJECTION 1 MG/ML
1.5000 mg | Freq: Once | INTRAVENOUS | Status: AC
  Administered 2012-03-19: 1.5 mg via INTRAVENOUS
  Filled 2012-03-19: qty 1.5

## 2012-03-19 MED ORDER — SODIUM CHLORIDE 0.9 % IJ SOLN
10.0000 mL | INTRAMUSCULAR | Status: DC | PRN
  Administered 2012-03-19: 10 mL
  Filled 2012-03-19: qty 10

## 2012-03-19 MED ORDER — ONDANSETRON 16 MG/50ML IVPB (CHCC)
16.0000 mg | Freq: Once | INTRAVENOUS | Status: AC
  Administered 2012-03-19: 16 mg via INTRAVENOUS

## 2012-03-19 MED ORDER — ACETAMINOPHEN 325 MG PO TABS
650.0000 mg | ORAL_TABLET | Freq: Once | ORAL | Status: AC
  Administered 2012-03-19: 650 mg via ORAL

## 2012-03-19 MED ORDER — DIPHENHYDRAMINE HCL 25 MG PO CAPS
50.0000 mg | ORAL_CAPSULE | Freq: Once | ORAL | Status: AC
  Administered 2012-03-19: 50 mg via ORAL

## 2012-03-19 NOTE — Patient Instructions (Signed)
[facility] Discharge Instructions for Patients Receiving Chemotherapy  Today you received the following chemotherapy agents Rituxan and Treanda  Take home medications  To help prevent nausea and vomiting after your treatment, we encourage you to take your nausea medication Compazine 10 mg by mouth every 6 hours as needed for nausea or vomiting If you develop nausea and vomiting that is not controlled by your nausea medication, call the clinic. If it is after clinic hours your family physician or the after hours number for the clinic or go to the Emergency Department.   BELOW ARE SYMPTOMS THAT SHOULD BE REPORTED IMMEDIATELY:  *FEVER GREATER THAN 100.5 F  *CHILLS WITH OR WITHOUT FEVER  NAUSEA AND VOMITING THAT IS NOT CONTROLLED WITH YOUR NAUSEA MEDICATION  *UNUSUAL SHORTNESS OF BREATH  *UNUSUAL BRUISING OR BLEEDING  TENDERNESS IN MOUTH AND THROAT WITH OR WITHOUT PRESENCE OF ULCERS  *URINARY PROBLEMS  *BOWEL PROBLEMS  UNUSUAL RASH Items with * indicate a potential emergency and should be followed up as soon as possible.  One of the nurses will contact you 24 hours after your treatment. Please let the nurse know about any problems that you may have experienced. Feel free to call the clinic you have any questions or concerns. The clinic phone number is [phone]   I have been informed and understand all the instructions given to me. I know to contact the clinic, my physician, or go to the Emergency Department if any problems should occur. I do not have any questions at this time, but understand that I may call the clinic during office hours   should I have any questions or need assistance in obtaining follow up care.    __________________________________________  _____________  __________ Signature of Patient or Authorized Representative            Date                   Time    __________________________________________ Nurse's  Signature       Rituximab injection What is this medicine? RITUXIMAB (ri TUX i mab) is a monoclonal antibody. This medicine changes the way the body's immune system works. It is used commonly to treat non-Hodgkin's lymphoma and other conditions. In cancer cells, this drug targets a specific protein within cancer cells and stops the cancer cells from growing. It is also used to treat rhuematoid arthritis (RA). In RA, this medicine slow the inflammatory process and help reduce joint pain and swelling. This medicine is often used with other cancer or arthritis medications. This medicine may be used for other purposes; ask your health care provider or pharmacist if you have questions. What should I tell my health care provider before I take this medicine? They need to know if you have any of these conditions: -blood disorders -heart disease -history of hepatitis B -infection (especially a virus infection such as chickenpox, cold sores, or herpes) -irregular heartbeat -kidney disease -lung or breathing disease, like asthma -lupus -an unusual or allergic reaction to rituximab, mouse proteins, other medicines, foods, dyes, or preservatives -pregnant or trying to get pregnant -breast-feeding How should I use this medicine? This medicine is for infusion into a vein. It is administered in a hospital or clinic by a specially trained health care professional. A special MedGuide will be given to you by the pharmacist with each prescription and refill. Be sure to read this information carefully each time. Talk to your pediatrician regarding the use of this medicine in   children. This medicine is not approved for use in children. Overdosage: If you think you have taken too much of this medicine contact a poison control center or emergency room at once. NOTE: This medicine is only for you. Do not share this medicine with others. What if I miss a dose? It is important not to miss a dose. Call your  doctor or health care professional if you are unable to keep an appointment. What may interact with this medicine? -cisplatin -medicines for blood pressure -some other medicines for arthritis -vaccines This list may not describe all possible interactions. Give your health care provider a list of all the medicines, herbs, non-prescription drugs, or dietary supplements you use. Also tell them if you smoke, drink alcohol, or use illegal drugs. Some items may interact with your medicine. What should I watch for while using this medicine? Report any side effects that you notice during your treatment right away, such as changes in your breathing, fever, chills, dizziness or lightheadedness. These effects are more common with the first dose. Visit your prescriber or health care professional for checks on your progress. You will need to have regular blood work. Report any other side effects. The side effects of this medicine can continue after you finish your treatment. Continue your course of treatment even though you feel ill unless your doctor tells you to stop. Call your doctor or health care professional for advice if you get a fever, chills or sore throat, or other symptoms of a cold or flu. Do not treat yourself. This drug decreases your body's ability to fight infections. Try to avoid being around people who are sick. This medicine may increase your risk to bruise or bleed. Call your doctor or health care professional if you notice any unusual bleeding. Be careful brushing and flossing your teeth or using a toothpick because you may get an infection or bleed more easily. If you have any dental work done, tell your dentist you are receiving this medicine. Avoid taking products that contain aspirin, acetaminophen, ibuprofen, naproxen, or ketoprofen unless instructed by your doctor. These medicines may hide a fever. Do not become pregnant while taking this medicine. Women should inform their doctor if  they wish to become pregnant or think they might be pregnant. There is a potential for serious side effects to an unborn child. Talk to your health care professional or pharmacist for more information. Do not breast-feed an infant while taking this medicine. What side effects may I notice from receiving this medicine? Side effects that you should report to your doctor or health care professional as soon as possible: -allergic reactions like skin rash, itching or hives, swelling of the face, lips, or tongue -low blood counts - this medicine may decrease the number of white blood cells, red blood cells and platelets. You may be at increased risk for infections and bleeding. -signs of infection - fever or chills, cough, sore throat, pain or difficulty passing urine -signs of decreased platelets or bleeding - bruising, pinpoint red spots on the skin, black, tarry stools, blood in the urine -signs of decreased red blood cells - unusually weak or tired, fainting spells, lightheadedness -breathing problems -confused, not responsive -chest pain -fast, irregular heartbeat -feeling faint or lightheaded, falls -mouth sores -redness, blistering, peeling or loosening of the skin, including inside the mouth -stomach pain -swelling of the ankles, feet, or hands -trouble passing urine or change in the amount of urine Side effects that usually do not require medical attention (  report to your doctor or other health care professional if they continue or are bothersome): -anxiety -headache -loss of appetite -muscle aches -nausea -night sweats This list may not describe all possible side effects. Call your doctor for medical advice about side effects. You may report side effects to FDA at 1-800-FDA-1088. Where should I keep my medicine? This drug is given in a hospital or clinic and will not be stored at home.   Cyclophosphamide injection What is this medicine? CYCLOPHOSPHAMIDE (sye kloe FOSS fa mide) is  a chemotherapy drug. It slows the growth of cancer cells. This medicine is used to treat many types of cancer like lymphoma, myeloma, leukemia, breast cancer, and ovarian cancer, to name a few. It is also used to treat nephrotic syndrome in children. This medicine may be used for other purposes; ask your health care provider or pharmacist if you have questions. What should I tell my health care provider before I take this medicine? They need to know if you have any of these conditions: -blood disorders -history of other chemotherapy -history of radiation therapy -infection -kidney disease -liver disease -tumors in the bone marrow -an unusual or allergic reaction to cyclophosphamide, other chemotherapy, other medicines, foods, dyes, or preservatives -pregnant or trying to get pregnant -breast-feeding How should I use this medicine? This drug is usually given as an injection into a vein or muscle or by infusion into a vein. It is administered in a hospital or clinic by a specially trained health care professional. Talk to your pediatrician regarding the use of this medicine in children. While this drug may be prescribed for selected conditions, precautions do apply. Overdosage: If you think you have taken too much of this medicine contact a poison control center or emergency room at once. NOTE: This medicine is only for you. Do not share this medicine with others. What if I miss a dose? It is important not to miss your dose. Call your doctor or health care professional if you are unable to keep an appointment. What may interact with this medicine? Do not take this medicine with any of the following medications: -mibefradil -nalidixic acid This medicine may also interact with the following medications: -doxorubicin -etanercept -medicines to increase blood counts like filgrastim, pegfilgrastim, sargramostim -medicines that block muscle or nerve pain -St. John's  Wort -phenobarbital -succinylcholine chloride -trastuzumab -vaccines Talk to your doctor or health care professional before taking any of these medicines: -acetaminophen -aspirin -ibuprofen -ketoprofen -naproxen This list may not describe all possible interactions. Give your health care provider a list of all the medicines, herbs, non-prescription drugs, or dietary supplements you use. Also tell them if you smoke, drink alcohol, or use illegal drugs. Some items may interact with your medicine. What should I watch for while using this medicine? Visit your doctor for checks on your progress. This drug may make you feel generally unwell. This is not uncommon, as chemotherapy can affect healthy cells as well as cancer cells. Report any side effects. Continue your course of treatment even though you feel ill unless your doctor tells you to stop. Drink water or other fluids as directed. Urinate often, even at night. In some cases, you may be given additional medicines to help with side effects. Follow all directions for their use. Call your doctor or health care professional for advice if you get a fever, chills or sore throat, or other symptoms of a cold or flu. Do not treat yourself. This drug decreases your body's ability to fight infections.   Try to avoid being around people who are sick. This medicine may increase your risk to bruise or bleed. Call your doctor or health care professional if you notice any unusual bleeding. Be careful brushing and flossing your teeth or using a toothpick because you may get an infection or bleed more easily. If you have any dental work done, tell your dentist you are receiving this medicine. Avoid taking products that contain aspirin, acetaminophen, ibuprofen, naproxen, or ketoprofen unless instructed by your doctor. These medicines may hide a fever. Do not become pregnant while taking this medicine. Women should inform their doctor if they wish to become pregnant  or think they might be pregnant. There is a potential for serious side effects to an unborn child. Talk to your health care professional or pharmacist for more information. Do not breast-feed an infant while taking this medicine. Men should inform their doctor if they wish to father a child. This medicine may lower sperm counts. If you are going to have surgery, tell your doctor or health care professional that you have taken this medicine. What side effects may I notice from receiving this medicine? Side effects that you should report to your doctor or health care professional as soon as possible: -allergic reactions like skin rash, itching or hives, swelling of the face, lips, or tongue -low blood counts - this medicine may decrease the number of white blood cells, red blood cells and platelets. You may be at increased risk for infections and bleeding. -signs of infection - fever or chills, cough, sore throat, pain or difficulty passing urine -signs of decreased platelets or bleeding - bruising, pinpoint red spots on the skin, black, tarry stools, blood in the urine -signs of decreased red blood cells - unusually weak or tired, fainting spells, lightheadedness -breathing problems -dark urine -mouth sores -pain, swelling, redness at site where injected -swelling of the ankles, feet, hands -trouble passing urine or change in the amount of urine -weight gain -yellowing of the eyes or skin Side effects that usually do not require medical attention (report to your doctor or health care professional if they continue or are bothersome): -changes in nail or skin color -diarrhea -hair loss -loss of appetite -missed menstrual periods -nausea, vomiting -stomach pain This list may not describe all possible side effects. Call your doctor for medical advice about side effects. You may report side effects to FDA at 1-800-FDA-1088. Where should I keep my medicine? This drug is given in a hospital or  clinic and will not be stored at home. NOTE: This sheet is a summary. It may not cover all possible information. If you have questions about this medicine, talk to your doctor, pharmacist, or health care provider.  2012, [employer]. (05/15/2007 2:32:25 PM) 

## 2012-03-19 NOTE — Progress Notes (Addendum)
Diagnosis: Marginal zone lymphoma.  Current therapy: The patient is status post 6 cycles of R.-CVP.  Interim history: Kristine Sullivan presents today for an office followup visit.  Overall, she, reports, that she's doing relatively well.  She is tolerating chemotherapy quite nicely without any major toxicity.  She did present with pretty severe hemolytic anemia and angioedema.  This has all resolved.  She reports, that she has a good appetite.  She denies any nausea, vomiting, diarrhea, constipation, chest pain, shortness of breath.  Any fevers, chills, or night sweats.  She denies any obvious, or abnormal bleeding.  She denies any lower leg swelling.  She denies any headaches, visual changes, or rashes.  She denies any mucositis.  She denies any excessive fatigue or weakness.  She does have some mild numbness and tingling in her fingers and toes, however, this does not interfere with her activities of daily living.  She will go ahead with her sixth cycle of chemotherapy.  Today.  Review of Systems: Constitutional:Negative for malaise/fatigue, fever, chills, weight loss, diaphoresis, activity change, appetite change, and unexpected weight change.  HEENT: Negative for double vision, blurred vision, visual loss, ear pain, tinnitus, congestion, rhinorrhea, epistaxis sore throat or sinus disease, oral pain/lesion, tongue soreness Respiratory: Negative for cough, chest tightness, shortness of breath, wheezing and stridor.  Cardiovascular: Negative for chest pain, palpitations, leg swelling, orthopnea, PND, DOE or claudication Gastrointestinal: Negative for nausea, vomiting, abdominal pain, diarrhea, constipation, blood in stool, melena, hematochezia, abdominal distention, anal bleeding, rectal pain, anorexia and hematemesis.  Genitourinary: Negative for dysuria, frequency, hematuria,  Musculoskeletal: Negative for myalgias, back pain, joint swelling, arthralgias and gait problem.  Skin: Negative for rash, color  change, pallor and wound.  Neurological:. Negative for dizziness/light-headedness, tremors, seizures, syncope, facial asymmetry, speech difficulty, weakness, numbness, headaches and paresthesias.  Hematological: Negative for adenopathy. Does not bruise/bleed easily.  Psychiatric/Behavioral:  Negative for depression, no loss of interest in normal activity or change in sleep pattern.   Physical Exam:  this is a pleasant, 79 year old, African American female, who is well-developed, well-nourished, in no obvious distress  Vitals: Temperature 97.8 degrees, pulse 74, respirations 16, blood pressure 122/51, weight 167 pounds HEENT reveals a normocephalic, atraumatic skull, no scleral icterus, no oral lesions  Neck is supple without any cervical or supraclavicular adenopathy.  Lungs are clear to auscultation bilaterally. There are no wheezes, rales or rhonci Cardiac is regular rate and rhythm with a normal S1 and S2. There are no murmurs, rubs, or bruits.  Abdomen is soft with good bowel sounds, there is no palpable mass. There is no palpable hepatosplenomegaly. There is no palpable fluid wave.  Musculoskeletal no tenderness of the spine, ribs, or hips.  Extremities there are no clubbing, cyanosis, or edema.  Skin no petechia, purpura or ecchymosis`Neurologic is nonfocal.  Laboratory Data: White count 4.3, hemoglobin 13.0, hematocrit 38.9, platelets 128,000  Current Outpatient Prescriptions on File Prior to Visit  Medication Sig Dispense Refill  . amLODipine (NORVASC) 10 MG tablet Take 10 mg by mouth at bedtime.       [name] aspirin 325 MG tablet Take 325 mg by mouth daily.      . danazol (DANOCRINE) 100 MG capsule Take 100 mg by mouth daily. For angioedema      . fish oil-omega-3 fatty acids 1000 MG capsule Take 1 g by mouth daily.       . folic acid (FOLVITE) 1 MG tablet Take 1 mg by mouth every morning.      [name]  furosemide (LASIX) 40 MG tablet Take 40 mg by mouth daily.      . hydrOXYzine  (ATARAX/VISTARIL) 25 MG tablet Take 25 mg by mouth at bedtime as needed. For itching      . KLOR-CON M10 10 MEQ tablet Take 10 mEq by mouth daily.       [name] lidocaine-prilocaine (EMLA) cream Apply topically as needed. Apply to portacath site 1-2 hours prior to chemotherapy.  30 g  0  . lovastatin (MEVACOR) 40 MG tablet Take 40 mg by mouth at bedtime.       . metFORMIN (GLUCOPHAGE) 500 MG tablet Take 500 mg by mouth daily with breakfast.      . ondansetron (ZOFRAN) 8 MG tablet Take 1 tablet two times a day starting the day after chemo for 3 days. Then take 1 tablet two times a day as needed for nausea or vomiting.  30 tablet  1  . PREDNISONE PO Take by mouth. Takes 1 daily for 5 days post chemo tx.      . prochlorperazine (COMPAZINE) 10 MG tablet Take 1 tablet (10 mg total) by mouth every 6 (six) hours as needed (Nausea or vomiting).  30 tablet  1  . timolol (TIMOPTIC) 0.5 % ophthalmic solution Place 1 drop into both eyes 2 (two) times daily.       . Travoprost, BAK Free, (TRAVATAN) 0.004 % SOLN ophthalmic solution Place 1 drop into both eyes at bedtime.      . triamterene-hydrochlorothiazide (DYAZIDE) 37.5-25 MG per capsule Take 1 capsule by mouth every morning.         Assessment/Plan: This is a very pleasant, 79 year old, African, American female, with the following issues:  #1.  Marginal zone lymphoma.  She did present with severe hemolytic anemia.  She also had angioedema.  Since she started on chemotherapy, this has all resolved.  The overall plan, is to treat her with a total of 8 cycles.  We will then transition to maintenance Rituxan.  She will go ahead with her seventh cycle today.  #2.  Followup.  We will follow back up with Ms. Shaft in another 3 weeks, but before then should there be questions or concerns.

## 2012-03-27 ENCOUNTER — Encounter (HOSPITAL_COMMUNITY)
Admission: RE | Admit: 2012-03-27 | Discharge: 2012-03-27 | Disposition: A | Discharge status: Home / Self Care | Payer: Medicare Other | Source: Ambulatory Visit | Attending: Hematology & Oncology | Admitting: Hematology & Oncology

## 2012-03-27 DIAGNOSIS — D599 Acquired hemolytic anemia, unspecified: Secondary | ICD-10-CM | POA: Insufficient documentation

## 2012-04-09 ENCOUNTER — Other Ambulatory Visit: Payer: Medicare Other | Admitting: Lab

## 2012-04-09 ENCOUNTER — Ambulatory Visit (HOSPITAL_BASED_OUTPATIENT_CLINIC_OR_DEPARTMENT_OTHER): Payer: Medicare Other

## 2012-04-09 ENCOUNTER — Ambulatory Visit (HOSPITAL_BASED_OUTPATIENT_CLINIC_OR_DEPARTMENT_OTHER): Payer: Medicare Other | Admitting: Hematology & Oncology

## 2012-04-09 ENCOUNTER — Other Ambulatory Visit: Payer: Self-pay | Admitting: Hematology & Oncology

## 2012-04-09 ENCOUNTER — Telehealth: Payer: Self-pay | Admitting: Hematology & Oncology

## 2012-04-09 VITALS — BP 106/60 | HR 77 | Temp 98.6°F | Resp 20

## 2012-04-09 VITALS — BP 136/56 | HR 71 | Temp 97.6°F | Resp 16 | Ht 61.0 in | Wt 166.0 lb

## 2012-04-09 DIAGNOSIS — C8307 Small cell B-cell lymphoma, spleen: Secondary | ICD-10-CM

## 2012-04-09 DIAGNOSIS — Z5111 Encounter for antineoplastic chemotherapy: Secondary | ICD-10-CM

## 2012-04-09 DIAGNOSIS — Z5112 Encounter for antineoplastic immunotherapy: Secondary | ICD-10-CM

## 2012-04-09 LAB — CBC WITH DIFFERENTIAL (CANCER CENTER ONLY)
BASO#: 0 10*3/uL (ref 0.0–0.2)
EOS%: 2.6 % (ref 0.0–7.0)
Eosinophils Absolute: 0.1 10*3/uL (ref 0.0–0.5)
HGB: 13.6 g/dL (ref 11.6–15.9)
LYMPH%: 17.3 % (ref 14.0–48.0)
MCH: 27.5 pg (ref 26.0–34.0)
MCHC: 33.7 g/dL (ref 32.0–36.0)
MCV: 81 fL (ref 81–101)
MONO%: 18.5 % — ABNORMAL HIGH (ref 0.0–13.0)
NEUT%: 61.3 % (ref 39.6–80.0)
RBC: 4.95 10*6/uL (ref 3.70–5.32)

## 2012-04-09 MED ORDER — SODIUM CHLORIDE 0.9 % IV SOLN
375.0000 mg/m2 | Freq: Once | INTRAVENOUS | Status: AC
  Administered 2012-04-09: 700 mg via INTRAVENOUS
  Filled 2012-04-09: qty 70

## 2012-04-09 MED ORDER — SODIUM CHLORIDE 0.9 % IV SOLN
Freq: Once | INTRAVENOUS | Status: AC
  Administered 2012-04-09: 10:00:00 via INTRAVENOUS

## 2012-04-09 MED ORDER — ACETAMINOPHEN 325 MG PO TABS
650.0000 mg | ORAL_TABLET | Freq: Once | ORAL | Status: AC
  Administered 2012-04-09: 650 mg via ORAL

## 2012-04-09 MED ORDER — HEPARIN SOD (PORK) LOCK FLUSH 100 UNIT/ML IV SOLN
500.0000 [IU] | Freq: Once | INTRAVENOUS | Status: AC | PRN
  Administered 2012-04-09: 500 [IU]
  Filled 2012-04-09: qty 5

## 2012-04-09 MED ORDER — VINCRISTINE SULFATE CHEMO INJECTION 1 MG/ML
1.5000 mg | Freq: Once | INTRAVENOUS | Status: AC
  Administered 2012-04-09: 1.5 mg via INTRAVENOUS
  Filled 2012-04-09: qty 1.5

## 2012-04-09 MED ORDER — SODIUM CHLORIDE 0.9 % IJ SOLN
10.0000 mL | INTRAMUSCULAR | Status: DC | PRN
  Administered 2012-04-09: 10 mL
  Filled 2012-04-09: qty 10

## 2012-04-09 MED ORDER — DIPHENHYDRAMINE HCL 25 MG PO CAPS
50.0000 mg | ORAL_CAPSULE | Freq: Once | ORAL | Status: AC
  Administered 2012-04-09: 50 mg via ORAL

## 2012-04-09 MED ORDER — ONDANSETRON 16 MG/50ML IVPB (CHCC)
16.0000 mg | Freq: Once | INTRAVENOUS | Status: AC
  Administered 2012-04-09: 16 mg via INTRAVENOUS

## 2012-04-09 MED ORDER — SODIUM CHLORIDE 0.9 % IV SOLN
800.0000 mg/m2 | Freq: Once | INTRAVENOUS | Status: AC
  Administered 2012-04-09: 1340 mg via INTRAVENOUS
  Filled 2012-04-09: qty 67

## 2012-04-09 MED ORDER — DEXAMETHASONE SODIUM PHOSPHATE 4 MG/ML IJ SOLN
20.0000 mg | Freq: Once | INTRAMUSCULAR | Status: AC
  Administered 2012-04-09: 20 mg via INTRAVENOUS

## 2012-04-09 NOTE — Progress Notes (Signed)
This office note has been dictated.

## 2012-04-09 NOTE — Addendum Note (Signed)
Addended by: Arlan Organ R on: 04/09/2012 01:05 PM   Modules accepted: Orders

## 2012-04-09 NOTE — Patient Instructions (Addendum)
[facility] Discharge Instructions for Patients Receiving Chemotherapy  Today you received the following chemotherapy agents Rituxan, Cytoxan and Vincristine  To help prevent nausea and vomiting after your treatment, we encourage you to take your nausea medication as prescribed   If you develop nausea and vomiting that is not controlled by your nausea medication, call the clinic. If it is after clinic hours your family physician or the after hours number for the clinic or go to the Emergency Department.   BELOW ARE SYMPTOMS THAT SHOULD BE REPORTED IMMEDIATELY:  *FEVER GREATER THAN 100.5 F  *CHILLS WITH OR WITHOUT FEVER  NAUSEA AND VOMITING THAT IS NOT CONTROLLED WITH YOUR NAUSEA MEDICATION  *UNUSUAL SHORTNESS OF BREATH  *UNUSUAL BRUISING OR BLEEDING  TENDERNESS IN MOUTH AND THROAT WITH OR WITHOUT PRESENCE OF ULCERS  *URINARY PROBLEMS  *BOWEL PROBLEMS  UNUSUAL RASH Items with * indicate a potential emergency and should be followed up as soon as possible.  One of the nurses will contact you 24 hours after your treatment. Please let the nurse know about any problems that you may have experienced. Feel free to call the clinic you have any questions or concerns. The clinic phone number is [phone].   I have been informed and understand all the instructions given to me. I know to contact the clinic, my physician, or go to the Emergency Department if any problems should occur. I do not have any questions at this time, but understand that I may call the clinic during office hours   should I have any questions or need assistance in obtaining follow up care.    __________________________________________  _____________  __________ Signature of Patient or Authorized Representative            Date                   Time    __________________________________________ Nurse's Signature

## 2012-04-09 NOTE — Telephone Encounter (Signed)
Pt aware scheduling will contact her for the BMBX in April. I spoke with Sheralyn Boatman she will contact pt

## 2012-04-10 NOTE — Progress Notes (Signed)
CC:   Kristine Level, MD  DIAGNOSIS:  Marginal zone lymphoma.  CURRENT THERAPY:  The patient to complete her 8th cycle of R-CVP today.  INTERIM HISTORY:  Kristine Sullivan comes in for her followup.  She continues to do very well.  She responded incredibly well to chemotherapy.  Her hemolytic anemia resolved.  Her angioedema also resolved.  I think the angioedema resolved because of the Rituxan.  She had a little bit of nausea but seems to be doing quite well.  Her weight is holding steady. There has been no cough.  She has had no bleeding.  There has been no diarrhea.  There has been no leg swelling.  She has had no rashes.  PHYSICAL EXAMINATION:  General:  This is a well-developed, well- nourished black female in no obvious distress.  Vital signs:  Show temperature of 97.6, pulse 71, respiratory rate 16, blood pressure 136/56.  Weight is 166.  Head and neck:  Shows a normocephalic, atraumatic skull.  There are no ocular or oral lesions.  There are no palpable cervical or supraclavicular lymph nodes.  Lungs:  Clear bilaterally.  Cardiac:  Regular rate and rhythm with a normal S1, S2. There are no murmurs, rubs or bruits.  Abdomen:  Soft with good bowel sounds.  There is no palpable abdominal mass.  There is no fluid wave. There is no palpable hepatosplenomegaly.  Back:  Shows no tenderness over the spine, ribs or hips.  Extremities:  Show no clubbing, cyanosis or edema.  Neurological:  Shows no focal neurological deficits.  LABORATORY STUDIES:  White cell count 3.5, hemoglobin 13.6, hematocrit 40.3, platelet count 140.  IMPRESSION:  Kristine Sullivan is a very charming 79 year old African American female with marginal zone lymphoma.  She presented in a most unusual way with severe autoimmune hemolytic anemia.  This resolved with chemotherapy.  Again, she also had angioedema which also has resolved.  We will finish her 8th cycle today.  She will need a bone marrow biopsy in 2 months.  We will  repeat her scans also in about 2 months' time. She will need maintenance Rituxan.  We will get that started probably in May.  I am just so happy that Kristine Sullivan's hematologic issues resolved with treatment and that her quality of life got a whole lot better.   ______________________________ [name], M.D. PRE/MEDQ  D:  04/09/2012  T:  04/10/2012  Job:  [id number]

## 2012-05-02 ENCOUNTER — Other Ambulatory Visit: Payer: Medicare Other | Admitting: Lab

## 2012-05-11 ENCOUNTER — Telehealth: Payer: Self-pay | Admitting: Hematology & Oncology

## 2012-05-11 NOTE — Telephone Encounter (Signed)
Pt aware of 4-7 lab before CT at [facility]

## 2012-05-11 NOTE — Telephone Encounter (Signed)
Sent medical records to Dr.Sokun Ogden office

## 2012-05-21 ENCOUNTER — Other Ambulatory Visit: Payer: Self-pay | Admitting: *Deleted

## 2012-05-22 ENCOUNTER — Encounter (HOSPITAL_COMMUNITY)
Admission: RE | Admit: 2012-05-22 | Discharge: 2012-05-22 | Disposition: A | Discharge status: Home / Self Care | Payer: Medicare Other | Source: Ambulatory Visit | Attending: Hematology & Oncology | Admitting: Hematology & Oncology

## 2012-05-22 DIAGNOSIS — D599 Acquired hemolytic anemia, unspecified: Secondary | ICD-10-CM | POA: Insufficient documentation

## 2012-05-25 ENCOUNTER — Other Ambulatory Visit: Payer: Self-pay | Admitting: Radiology

## 2012-05-25 ENCOUNTER — Other Ambulatory Visit: Payer: Self-pay | Admitting: *Deleted

## 2012-05-25 DIAGNOSIS — C8307 Small cell B-cell lymphoma, spleen: Secondary | ICD-10-CM

## 2012-05-28 ENCOUNTER — Ambulatory Visit (HOSPITAL_COMMUNITY)
Admission: RE | Admit: 2012-05-28 | Discharge: 2012-05-28 | Disposition: A | Discharge status: Home / Self Care | Payer: Medicare Other | Source: Ambulatory Visit | Attending: Hematology & Oncology | Admitting: Hematology & Oncology

## 2012-05-28 ENCOUNTER — Other Ambulatory Visit: Payer: Self-pay | Admitting: Radiology

## 2012-05-28 ENCOUNTER — Encounter (HOSPITAL_COMMUNITY): Payer: Self-pay | Admitting: Pharmacy Technician

## 2012-05-28 ENCOUNTER — Other Ambulatory Visit (HOSPITAL_BASED_OUTPATIENT_CLINIC_OR_DEPARTMENT_OTHER): Payer: Medicare Other

## 2012-05-28 ENCOUNTER — Encounter (HOSPITAL_COMMUNITY): Payer: Self-pay

## 2012-05-28 DIAGNOSIS — I517 Cardiomegaly: Secondary | ICD-10-CM | POA: Insufficient documentation

## 2012-05-28 DIAGNOSIS — C8307 Small cell B-cell lymphoma, spleen: Secondary | ICD-10-CM

## 2012-05-28 DIAGNOSIS — K449 Diaphragmatic hernia without obstruction or gangrene: Secondary | ICD-10-CM | POA: Insufficient documentation

## 2012-05-28 DIAGNOSIS — K429 Umbilical hernia without obstruction or gangrene: Secondary | ICD-10-CM | POA: Insufficient documentation

## 2012-05-28 DIAGNOSIS — I059 Rheumatic mitral valve disease, unspecified: Secondary | ICD-10-CM | POA: Insufficient documentation

## 2012-05-28 DIAGNOSIS — I2510 Atherosclerotic heart disease of native coronary artery without angina pectoris: Secondary | ICD-10-CM | POA: Insufficient documentation

## 2012-05-28 DIAGNOSIS — C8589 Other specified types of non-Hodgkin lymphoma, extranodal and solid organ sites: Secondary | ICD-10-CM | POA: Insufficient documentation

## 2012-05-28 DIAGNOSIS — N9489 Other specified conditions associated with female genital organs and menstrual cycle: Secondary | ICD-10-CM | POA: Insufficient documentation

## 2012-05-28 DIAGNOSIS — D259 Leiomyoma of uterus, unspecified: Secondary | ICD-10-CM | POA: Insufficient documentation

## 2012-05-28 DIAGNOSIS — R0602 Shortness of breath: Secondary | ICD-10-CM | POA: Insufficient documentation

## 2012-05-28 DIAGNOSIS — K5730 Diverticulosis of large intestine without perforation or abscess without bleeding: Secondary | ICD-10-CM | POA: Insufficient documentation

## 2012-05-28 DIAGNOSIS — I700 Atherosclerosis of aorta: Secondary | ICD-10-CM | POA: Insufficient documentation

## 2012-05-28 LAB — BASIC METABOLIC PANEL (CC13)
Glucose: 120 mg/dl — ABNORMAL HIGH (ref 70–99)
Potassium: 3.7 mEq/L (ref 3.5–5.1)
Sodium: 139 mEq/L (ref 136–145)

## 2012-05-28 MED ORDER — IOHEXOL 300 MG/ML  SOLN
100.0000 mL | Freq: Once | INTRAMUSCULAR | Status: AC | PRN
  Administered 2012-05-28: 100 mL via INTRAVENOUS

## 2012-05-29 ENCOUNTER — Telehealth: Payer: Self-pay

## 2012-05-29 NOTE — Telephone Encounter (Addendum)
Message copied by Leana Gamer on Tue May 29, 2012  9:02 AM ------      Message from: Arlan Organ R      Created: Mon May 28, 2012  6:03 PM       Call:  No obvious lymphoma!! Kristine Sullivan ------Patient call and given result. Pt happy with her results.

## 2012-05-30 ENCOUNTER — Ambulatory Visit (HOSPITAL_COMMUNITY): Admission: RE | Admit: 2012-05-30 | Payer: Medicare Other | Source: Ambulatory Visit

## 2012-05-30 ENCOUNTER — Other Ambulatory Visit: Payer: Self-pay | Admitting: Radiology

## 2012-05-31 ENCOUNTER — Ambulatory Visit (HOSPITAL_COMMUNITY)
Admission: RE | Admit: 2012-05-31 | Discharge: 2012-05-31 | Disposition: A | Discharge status: Home / Self Care | Payer: Medicare Other | Source: Ambulatory Visit | Attending: Hematology & Oncology | Admitting: Hematology & Oncology

## 2012-05-31 ENCOUNTER — Encounter (HOSPITAL_COMMUNITY): Payer: Self-pay

## 2012-05-31 DIAGNOSIS — C8307 Small cell B-cell lymphoma, spleen: Secondary | ICD-10-CM | POA: Insufficient documentation

## 2012-05-31 LAB — CBC
HCT: 39.3 % (ref 36.0–46.0)
Hemoglobin: 13.3 g/dL (ref 12.0–15.0)
MCH: 27.7 pg (ref 26.0–34.0)
MCV: 81.7 fL (ref 78.0–100.0)
RBC: 4.81 MIL/uL (ref 3.87–5.11)

## 2012-05-31 LAB — BONE MARROW EXAM: Bone Marrow Exam: 242

## 2012-05-31 MED ORDER — FENTANYL CITRATE 0.05 MG/ML IJ SOLN
INTRAMUSCULAR | Status: AC
  Filled 2012-05-31: qty 4

## 2012-05-31 MED ORDER — FENTANYL CITRATE 0.05 MG/ML IJ SOLN
INTRAMUSCULAR | Status: AC | PRN
  Administered 2012-05-31: 50 ug via INTRAVENOUS
  Administered 2012-05-31: 100 ug via INTRAVENOUS

## 2012-05-31 MED ORDER — MIDAZOLAM HCL 2 MG/2ML IJ SOLN
INTRAMUSCULAR | Status: AC
  Filled 2012-05-31: qty 4

## 2012-05-31 MED ORDER — MIDAZOLAM HCL 2 MG/2ML IJ SOLN
INTRAMUSCULAR | Status: AC | PRN
  Administered 2012-05-31: 2 mg via INTRAVENOUS

## 2012-05-31 MED ORDER — SODIUM CHLORIDE 0.9 % IV SOLN
Freq: Once | INTRAVENOUS | Status: AC
  Administered 2012-05-31: 08:00:00 via INTRAVENOUS

## 2012-05-31 MED ORDER — HEPARIN SOD (PORK) LOCK FLUSH 100 UNIT/ML IV SOLN
INTRAVENOUS | Status: AC
  Administered 2012-05-31: 500 [IU] via NASAL
  Filled 2012-05-31: qty 5

## 2012-05-31 NOTE — H&P (Signed)
Kristine Sullivan is an 79 y.o. female.   Chief Complaint: "I'm having a bone marrow biopsy" [id number] with history of marginal zone lymphoma presents today for CT guided bone marrow biopsy to assess response to chemotherapy.  Past Medical History  Diagnosis Date  . Anorexia   . Pneumonia, organism unspecified   . Other dyspnea and respiratory abnormality   . Thyrotoxicosis without mention of goiter or other cause, without mention of thyrotoxic crisis or storm   . Hypoxemia   . Intestinal infection due to other organism, not elsewhere classified   . 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 Cataract extraction      History reviewed. No pertinent family history. Social History:  has no tobacco, alcohol, and drug history on file.  Allergies: No Known Allergies  Current outpatient prescriptions:fish oil-omega-3 fatty acids 1000 MG capsule, Take 1 g by mouth daily. , Disp: , Rfl: ;  folic acid (FOLVITE) 1 MG tablet, Take 1 mg by mouth every morning., Disp: , Rfl: ;  lovastatin (MEVACOR) 40 MG tablet, Take 40 mg by mouth at bedtime. , Disp: , Rfl: ;  metFORMIN (GLUCOPHAGE) 500 MG tablet, Take 500 mg by mouth daily with breakfast., Disp: , Rfl:  potassium chloride (K-DUR,KLOR-CON) 10 MEQ tablet, Take 10 mEq by mouth 2 (two) times daily., Disp: , Rfl: ;  timolol (TIMOPTIC) 0.5 % ophthalmic solution, Place 1 drop into both eyes 2 (two) times daily. , Disp: , Rfl: ;  Travoprost, BAK Free, (TRAVATAN) 0.004 % SOLN ophthalmic solution, Place 1 drop into both eyes at bedtime., Disp: , Rfl:  triamterene-hydrochlorothiazide (DYAZIDE) 37.5-25 MG per capsule, Take 1 capsule by mouth every morning. , Disp: , Rfl: ;  aspirin 325 MG tablet, Take 325 mg by mouth daily., Disp: , Rfl: ;  danazol (DANOCRINE) 100 MG capsule, Take 100 mg by mouth daily. For angioedema, Disp: , Rfl: ;  furosemide (LASIX) 40 MG tablet, Take 40 mg by mouth daily., Disp: , Rfl:   hydrOXYzine (ATARAX/VISTARIL) 25 MG tablet, Take 25 mg by mouth at bedtime as needed for itching. , Disp: , Rfl: ;  lidocaine-prilocaine (EMLA) cream, Apply topically as needed. Apply to portacath site 1-2 hours prior to chemotherapy., Disp: 30 g, Rfl: 0 ondansetron (ZOFRAN) 8 MG tablet, Take 1 tablet two times a day starting the day after chemo for 3 days. Then take 1 tablet two times a day as needed for nausea or vomiting., Disp: 30 tablet, Rfl: 1;  predniSONE (DELTASONE) 20 MG tablet, Take 20 mg by mouth daily. Take 5 days after chemo tx, Disp: , Rfl:  prochlorperazine (COMPAZINE) 10 MG tablet, Take 1 tablet (10 mg total) by mouth every 6 (six) hours as needed (Nausea or vomiting)., Disp: 30 tablet, Rfl: 1 No current facility-administered medications for this encounter. Facility-Administered Medications Ordered in Other Encounters: 0.9 %  sodium chloride infusion, 250 mL, Intravenous, Once, Kristine Macho, MD   Results for orders placed during the hospital encounter of 05/31/12 (from the past 48 hour(s))  APTT     Status: Abnormal   Collection Time    05/31/12  7:35 AM      Result Value Range   aPTT 51 (*) 24 - 37 seconds   Comment:            IF BASELINE aPTT IS ELEVATED,     SUGGEST PATIENT RISK ASSESSMENT     BE USED TO DETERMINE APPROPRIATE  ANTICOAGULANT THERAPY.  CBC     Status: Abnormal   Collection Time    05/31/12  7:35 AM      Result Value Range   WBC 5.2  4.0 - 10.5 K/uL   RBC 4.81  3.87 - 5.11 MIL/uL   Hemoglobin 13.3  12.0 - 15.0 g/dL   HCT 16.1  09.6 - 04.5 %   MCV 81.7  78.0 - 100.0 fL   MCH 27.7  26.0 - 34.0 pg   MCHC 33.8  30.0 - 36.0 g/dL   RDW 40.9  81.1 - 91.4 %   Platelets 122 (*) 150 - 400 K/uL  PROTIME-INR     Status: None   Collection Time    05/31/12  7:35 AM      Result Value Range   Prothrombin Time 14.0  11.6 - 15.2 seconds   INR 1.09  0.00 - 1.49   No results found.  Review of Systems  Constitutional: Negative for fever and chills.   HENT:       Occ HA's  Respiratory: Negative for cough and shortness of breath.   Cardiovascular: Negative for chest pain.  Gastrointestinal: Negative for nausea, vomiting and abdominal pain.  Musculoskeletal: Negative for back pain.       Occ rt shoulder pain  Endo/Heme/Allergies: Does not bruise/bleed easily.    Blood pressure 138/62, pulse 69, temperature 97.9 F (36.6 C), temperature source Oral, resp. rate 20, height 4\' 11"  (1.499 m), weight 166 lb (75.297 kg), SpO2 99.00%. Physical Exam  Constitutional: She is oriented to person, place, and time. She appears well-developed and well-nourished.  Cardiovascular: Normal rate and regular rhythm.   Respiratory: Effort normal and breath sounds normal.  Clean, intact rt chest PAC  GI: Soft. Bowel sounds are normal. There is no tenderness.  Musculoskeletal: Normal range of motion. She exhibits no edema.  Neurological: She is alert and oriented to person, place, and time.     Assessment/Plan Pt with hx of marginal zone lymphoma. Plan is for CT guided bone marrow biopsy today to assess response to chemotherapy. Details/risks of procedure d/w pt with her understanding and consent.  Kristine Sullivan,D Kristine Sullivan 05/31/2012, 8:51 AM

## 2012-05-31 NOTE — Procedures (Signed)
Technically successful CT guided bone marrow aspiration and biopsy of left iliac crest. No immediate complications. Awaiting pathology report.    

## 2012-06-06 LAB — CHROMOSOME ANALYSIS, BONE MARROW

## 2012-06-14 ENCOUNTER — Encounter: Payer: Self-pay | Admitting: Hematology & Oncology

## 2012-06-21 ENCOUNTER — Encounter (HOSPITAL_COMMUNITY)
Admission: RE | Admit: 2012-06-21 | Discharge: 2012-06-21 | Disposition: A | Discharge status: Home / Self Care | Payer: Medicare Other | Source: Ambulatory Visit | Attending: Hematology & Oncology | Admitting: Hematology & Oncology

## 2012-07-02 ENCOUNTER — Other Ambulatory Visit: Payer: Medicare Other | Admitting: Lab

## 2012-07-02 ENCOUNTER — Ambulatory Visit (HOSPITAL_BASED_OUTPATIENT_CLINIC_OR_DEPARTMENT_OTHER): Payer: Medicaid Other

## 2012-07-02 ENCOUNTER — Other Ambulatory Visit: Payer: Self-pay | Admitting: Medical

## 2012-07-02 ENCOUNTER — Ambulatory Visit (HOSPITAL_BASED_OUTPATIENT_CLINIC_OR_DEPARTMENT_OTHER): Payer: Medicaid Other | Admitting: Hematology & Oncology

## 2012-07-02 ENCOUNTER — Other Ambulatory Visit (HOSPITAL_BASED_OUTPATIENT_CLINIC_OR_DEPARTMENT_OTHER): Payer: Medicare Other | Admitting: Lab

## 2012-07-02 VITALS — BP 117/69 | HR 76 | Temp 98.4°F | Resp 16

## 2012-07-02 VITALS — BP 96/64 | HR 74 | Temp 97.9°F | Resp 16 | Ht 59.0 in | Wt 166.0 lb

## 2012-07-02 DIAGNOSIS — C8307 Small cell B-cell lymphoma, spleen: Secondary | ICD-10-CM

## 2012-07-02 DIAGNOSIS — Z5112 Encounter for antineoplastic immunotherapy: Secondary | ICD-10-CM

## 2012-07-02 DIAGNOSIS — C8580 Other specified types of non-Hodgkin lymphoma, unspecified site: Secondary | ICD-10-CM

## 2012-07-02 LAB — CBC WITH DIFFERENTIAL (CANCER CENTER ONLY)
BASO#: 0 10*3/uL (ref 0.0–0.2)
Eosinophils Absolute: 0.1 10*3/uL (ref 0.0–0.5)
HCT: 43.3 % (ref 34.8–46.6)
HGB: 14.7 g/dL (ref 11.6–15.9)
LYMPH%: 15.6 % (ref 14.0–48.0)
MCV: 81 fL (ref 81–101)
MONO#: 0.5 10*3/uL (ref 0.1–0.9)
Platelets: 142 10*3/uL — ABNORMAL LOW (ref 145–400)
RBC: 5.35 10*6/uL — ABNORMAL HIGH (ref 3.70–5.32)
WBC: 4.5 10*3/uL (ref 3.9–10.0)

## 2012-07-02 LAB — COMPREHENSIVE METABOLIC PANEL
Albumin: 4.2 g/dL (ref 3.5–5.2)
BUN: 19 mg/dL (ref 6–23)
CO2: 29 mEq/L (ref 19–32)
Glucose, Bld: 187 mg/dL — ABNORMAL HIGH (ref 70–99)
Sodium: 136 mEq/L (ref 135–145)
Total Bilirubin: 0.6 mg/dL (ref 0.3–1.2)
Total Protein: 6.8 g/dL (ref 6.0–8.3)

## 2012-07-02 LAB — LACTATE DEHYDROGENASE: LDH: 188 U/L (ref 94–250)

## 2012-07-02 MED ORDER — SODIUM CHLORIDE 0.9 % IV SOLN: Freq: Once | INTRAVENOUS | Status: DC

## 2012-07-02 MED ORDER — SODIUM CHLORIDE 0.9 % IV SOLN
INTRAVENOUS | Status: DC
  Administered 2012-07-02 (×2): via INTRAVENOUS

## 2012-07-02 MED ORDER — SODIUM CHLORIDE 0.9 % IV SOLN
375.0000 mg/m2 | Freq: Once | INTRAVENOUS | Status: AC
  Administered 2012-07-02: 700 mg via INTRAVENOUS
  Filled 2012-07-02: qty 70

## 2012-07-02 MED ORDER — HEPARIN SOD (PORK) LOCK FLUSH 100 UNIT/ML IV SOLN
250.0000 [IU] | Freq: Once | INTRAVENOUS | Status: DC | PRN
  Filled 2012-07-02: qty 5

## 2012-07-02 MED ORDER — HEPARIN SOD (PORK) LOCK FLUSH 100 UNIT/ML IV SOLN
500.0000 [IU] | Freq: Once | INTRAVENOUS | Status: AC | PRN
  Administered 2012-07-02: 500 [IU]
  Filled 2012-07-02: qty 5

## 2012-07-02 MED ORDER — ALTEPLASE 2 MG IJ SOLR
2.0000 mg | Freq: Once | INTRAMUSCULAR | Status: DC | PRN
Start: 2012-07-02 — End: 2012-07-02
  Filled 2012-07-02: qty 2

## 2012-07-02 MED ORDER — SODIUM CHLORIDE 0.9 % IJ SOLN
10.0000 mL | INTRAMUSCULAR | Status: DC | PRN
  Administered 2012-07-02: 10 mL
  Filled 2012-07-02: qty 10

## 2012-07-02 MED ORDER — DIPHENHYDRAMINE HCL 25 MG PO CAPS
50.0000 mg | ORAL_CAPSULE | Freq: Once | ORAL | Status: AC
  Administered 2012-07-02: 50 mg via ORAL

## 2012-07-02 MED ORDER — ACETAMINOPHEN 325 MG PO TABS
650.0000 mg | ORAL_TABLET | Freq: Once | ORAL | Status: AC
  Administered 2012-07-02: 650 mg via ORAL

## 2012-07-02 MED ORDER — SODIUM CHLORIDE 0.9 % IJ SOLN
3.0000 mL | INTRAMUSCULAR | Status: DC | PRN
  Filled 2012-07-02: qty 10

## 2012-07-02 NOTE — Progress Notes (Signed)
This office note has been dictated.

## 2012-07-03 ENCOUNTER — Telehealth: Payer: Self-pay | Admitting: Hematology & Oncology

## 2012-07-03 NOTE — Progress Notes (Signed)
CC:   Vinnie Level, MD  DIAGNOSIS:  Marginal zone lymphoma, clinical remission.  CURRENT THERAPY:  The patient is status post 8 cycles of R-CVP.  INTERIM HISTORY:  Ms. Lippman comes in for her followup.  She is looking pretty good.  We last saw her back in February.  We did go ahead and get a bone marrow biopsy done on her.  This was done on April 10.  The bone marrow report ([id number]) showed a mildly hypercellular marrow with 1 non paratrabecular lymphoid aggregate.  The flow cytometry did not show any obvious monoclonal population of B cells.  She is feeling quite well.  We did go ahead and repeat her scans.  CT of the chest, abdomen and pelvis did not show any evidence of lymphadenopathy.  There are no pulmonary nodules.  She had numerous colonic diverticula.  She had no splenomegaly.  I would have to say that she is in a clinical remission right now.  She has had no nausea.  She has had no cough.  She has had no leg swelling.  There has been no fatigue.  Of note, she presented with severe hemolytic anemia.  PHYSICAL EXAMINATION:  General:  This is a well-developed, well- nourished African American female in no obvious distress.  Vital signs: Show a temperature of 97.9, pulse 74, respiratory rate 16, blood pressure 96/64.  Weight is 166.  Head and neck:  Shows a normocephalic, atraumatic skull.  There are no ocular or oral lesions.  There are no palpable cervical or supraclavicular lymph nodes.  Lungs:  Clear bilaterally.  Cardiac:  Regular rate and rhythm with a normal S1 and S2. There are no murmurs, rubs or bruits.  Abdomen:  Soft with good bowel sounds.  There is no palpable abdominal mass.  There is no fluid wave. There is no palpable hepatosplenomegaly.  Extremities:  Show no clubbing, cyanosis or edema.  Neurological:  Shows no focal neurological deficits.  Skin:  No rashes, ecchymoses or petechiae.  LABORATORY STUDIES:  White cell count is 4.5, hemoglobin  14.7, hematocrit 43.3, platelet count 142,000.  LDH is 188.  BUN is 19, creatinine 0.92.  IMPRESSION:  Ms. Zipper is a very charming 79 year old African American female with marginal zone lymphoma.  She did incredibly well with the treatment.  I forgot to mention that she also had angioedema.  This basically resolved.  I feel Rituxan had something to do with this.  I still feel that she would be a very good candidate for systemic maintenance therapy with Rituxan.  We will get her back every couple months for 2 years, as is the standard of care.  I am just so glad that her quality of life has improved dramatically since we gave her treatment.  We will get her back in 2 months.    ______________________________ [name], M.D. PRE/MEDQ  D:  07/02/2012  T:  07/03/2012  Job:  [id number]

## 2012-07-03 NOTE — Telephone Encounter (Signed)
Pt aware of 6-23 appointment °

## 2012-07-24 ENCOUNTER — Encounter (HOSPITAL_COMMUNITY)
Admission: RE | Admit: 2012-07-24 | Discharge: 2012-07-24 | Disposition: A | Discharge status: Home / Self Care | Payer: Medicare Other | Source: Ambulatory Visit | Attending: Hematology & Oncology | Admitting: Hematology & Oncology

## 2012-07-28 DIAGNOSIS — D500 Iron deficiency anemia secondary to blood loss (chronic): Secondary | ICD-10-CM | POA: Insufficient documentation

## 2012-07-28 DIAGNOSIS — L84 Corns and callosities: Secondary | ICD-10-CM | POA: Insufficient documentation

## 2012-07-28 DIAGNOSIS — I059 Rheumatic mitral valve disease, unspecified: Secondary | ICD-10-CM | POA: Insufficient documentation

## 2012-07-28 DIAGNOSIS — K5730 Diverticulosis of large intestine without perforation or abscess without bleeding: Secondary | ICD-10-CM | POA: Insufficient documentation

## 2012-07-28 DIAGNOSIS — E1142 Type 2 diabetes mellitus with diabetic polyneuropathy: Secondary | ICD-10-CM | POA: Insufficient documentation

## 2012-07-28 DIAGNOSIS — I359 Nonrheumatic aortic valve disorder, unspecified: Secondary | ICD-10-CM | POA: Insufficient documentation

## 2012-07-28 DIAGNOSIS — J309 Allergic rhinitis, unspecified: Secondary | ICD-10-CM | POA: Insufficient documentation

## 2012-07-28 DIAGNOSIS — T783XXA Angioneurotic edema, initial encounter: Secondary | ICD-10-CM | POA: Insufficient documentation

## 2012-07-28 DIAGNOSIS — M1990 Unspecified osteoarthritis, unspecified site: Secondary | ICD-10-CM | POA: Insufficient documentation

## 2012-07-28 DIAGNOSIS — M899 Disorder of bone, unspecified: Secondary | ICD-10-CM | POA: Insufficient documentation

## 2012-07-28 DIAGNOSIS — I079 Rheumatic tricuspid valve disease, unspecified: Secondary | ICD-10-CM | POA: Insufficient documentation

## 2012-08-10 ENCOUNTER — Telehealth: Payer: Self-pay | Admitting: Hematology & Oncology

## 2012-08-10 NOTE — Telephone Encounter (Signed)
Left pt message moved 6-23 to 7-23

## 2012-08-13 ENCOUNTER — Other Ambulatory Visit: Payer: Medicare Other | Admitting: Lab

## 2012-08-13 ENCOUNTER — Ambulatory Visit: Payer: Medicare Other | Admitting: Medical

## 2012-08-13 ENCOUNTER — Ambulatory Visit: Payer: Medicare Other

## 2012-08-22 ENCOUNTER — Encounter (HOSPITAL_COMMUNITY)
Admission: RE | Admit: 2012-08-22 | Discharge: 2012-08-22 | Disposition: A | Discharge status: Home / Self Care | Payer: Medicare Other | Source: Ambulatory Visit | Attending: Hematology & Oncology | Admitting: Hematology & Oncology

## 2012-09-12 ENCOUNTER — Ambulatory Visit (HOSPITAL_BASED_OUTPATIENT_CLINIC_OR_DEPARTMENT_OTHER): Payer: Medicare Other | Admitting: Hematology & Oncology

## 2012-09-12 ENCOUNTER — Ambulatory Visit (HOSPITAL_BASED_OUTPATIENT_CLINIC_OR_DEPARTMENT_OTHER): Payer: Medicare Other

## 2012-09-12 ENCOUNTER — Other Ambulatory Visit (HOSPITAL_BASED_OUTPATIENT_CLINIC_OR_DEPARTMENT_OTHER): Payer: Medicare Other | Admitting: Lab

## 2012-09-12 VITALS — BP 119/51 | HR 68 | Temp 98.0°F | Resp 20

## 2012-09-12 VITALS — BP 120/54 | HR 63 | Temp 97.7°F | Resp 16 | Ht <= 58 in | Wt 170.0 lb

## 2012-09-12 DIAGNOSIS — Z5112 Encounter for antineoplastic immunotherapy: Secondary | ICD-10-CM

## 2012-09-12 DIAGNOSIS — C8307 Small cell B-cell lymphoma, spleen: Secondary | ICD-10-CM

## 2012-09-12 DIAGNOSIS — C8580 Other specified types of non-Hodgkin lymphoma, unspecified site: Secondary | ICD-10-CM

## 2012-09-12 LAB — CBC WITH DIFFERENTIAL (CANCER CENTER ONLY)
BASO#: 0 10*3/uL (ref 0.0–0.2)
BASO%: 0.4 % (ref 0.0–2.0)
EOS%: 1.7 % (ref 0.0–7.0)
HCT: 42.8 % (ref 34.8–46.6)
HGB: 14.8 g/dL (ref 11.6–15.9)
LYMPH#: 0.7 10*3/uL — ABNORMAL LOW (ref 0.9–3.3)
MONO#: 0.5 10*3/uL (ref 0.1–0.9)
NEUT#: 3.9 10*3/uL (ref 1.5–6.5)
NEUT%: 74.9 % (ref 39.6–80.0)
RBC: 5.37 10*6/uL — ABNORMAL HIGH (ref 3.70–5.32)
WBC: 5.2 10*3/uL (ref 3.9–10.0)

## 2012-09-12 LAB — LACTATE DEHYDROGENASE: LDH: 223 U/L (ref 94–250)

## 2012-09-12 LAB — BASIC METABOLIC PANEL
CO2: 30 mEq/L (ref 19–32)
Calcium: 9 mg/dL (ref 8.4–10.5)
Creatinine, Ser: 0.69 mg/dL (ref 0.50–1.10)

## 2012-09-12 LAB — RETICULOCYTES (CHCC): ABS Retic: 68.5 10*3/uL (ref 19.0–186.0)

## 2012-09-12 MED ORDER — SODIUM CHLORIDE 0.9 % IV SOLN
375.0000 mg/m2 | Freq: Once | INTRAVENOUS | Status: AC
  Administered 2012-09-12: 700 mg via INTRAVENOUS
  Filled 2012-09-12: qty 70

## 2012-09-12 MED ORDER — ACETAMINOPHEN 325 MG PO TABS
650.0000 mg | ORAL_TABLET | Freq: Once | ORAL | Status: AC
  Administered 2012-09-12: 650 mg via ORAL

## 2012-09-12 MED ORDER — SODIUM CHLORIDE 0.9 % IV SOLN
Freq: Once | INTRAVENOUS | Status: AC
  Administered 2012-09-12: 10:00:00 via INTRAVENOUS

## 2012-09-12 MED ORDER — DIPHENHYDRAMINE HCL 25 MG PO CAPS
50.0000 mg | ORAL_CAPSULE | Freq: Once | ORAL | Status: AC
  Administered 2012-09-12: 50 mg via ORAL

## 2012-09-12 MED ORDER — SODIUM CHLORIDE 0.9 % IJ SOLN
10.0000 mL | INTRAMUSCULAR | Status: DC | PRN
  Administered 2012-09-12: 10 mL
  Filled 2012-09-12: qty 10

## 2012-09-12 MED ORDER — HEPARIN SOD (PORK) LOCK FLUSH 100 UNIT/ML IV SOLN
500.0000 [IU] | Freq: Once | INTRAVENOUS | Status: AC | PRN
  Administered 2012-09-12: 500 [IU]
  Filled 2012-09-12: qty 5

## 2012-09-12 NOTE — Patient Instructions (Signed)

## 2012-09-12 NOTE — Progress Notes (Signed)
This office note has been dictated.

## 2012-09-13 NOTE — Progress Notes (Signed)
CC:   Kristine Level, MD  DIAGNOSIS:  Marginal zone lymphoma.  CURRENT THERAPY:  Maintenance Rituxan q.2 months.  INTERIM HISTORY:  Kristine Sullivan come in for followup.  She is doing quite well.  She has no complaints outside of some arthralgias.  This certainly could be from the past chemotherapy that she took.  She completed R-CVP back in February 2014.  She has had a good appetite.  She has gained some weight, which she is not too happy about.  She has had a little bit of dizziness when she stands up quickly.  She has had no headache.  There has been no fever.  There has been no change in bowel or bladder habits.  PHYSICAL EXAMINATION:  General:  This is a well-developed, well- nourished African American female in no obvious distress.  Vital signs: Temperature of 97.7, pulse 63, respiratory rate 16, blood pressure is 120/54.  Weight is 170.  Head and neck:  Shows a normocephalic, atraumatic skull.  There are no ocular or oral lesions.  There are no palpable cervical or supraclavicular lymph nodes.  Lungs:  Clear bilaterally.  Cardiac:  Regular rate and rhythm with a normal S1 and S2. She may have an occasional extra beat.  Abdomen:  Soft.  She is mildly obese.  She has good bowel sounds.  There is no fluid wave.  There is no palpable hepatosplenomegaly.  Extremities:  Show no clubbing, cyanosis or edema.  She has good range motion of her joints.  No joint swelling, erythema or warmth was noted.  Neurologic:  No focal neurological deficits.  LABORATORY STUDIES:  The white cell count is 5.2, hemoglobin 14.8, hematocrit 42.8, platelet count 127.  IMPRESSION:  Kristine Sullivan is a 79 year old African female with a history of marginal zone lymphoma.  She presented with severe hemolytic anemia. This resolved with treatment of her lymphoma.  We have her on maintenance therapy now.  I thought she would be a good candidate for maintenance therapy in order to minimize her risk of recurrent  anemia, which was incredibly symptomatic for her.  We will get her back in 2 more months.  We will continue the maintenance Rituxan.   ______________________________ [name], M.D. PRE/MEDQ  D:  09/12/2012  T:  09/13/2012  Job:  [id number]

## 2012-09-21 ENCOUNTER — Encounter (HOSPITAL_COMMUNITY)
Admission: RE | Admit: 2012-09-21 | Discharge: 2012-09-21 | Disposition: A | Discharge status: Home / Self Care | Payer: Medicare Other | Source: Ambulatory Visit | Attending: Hematology & Oncology | Admitting: Hematology & Oncology

## 2012-10-25 ENCOUNTER — Ambulatory Visit (HOSPITAL_COMMUNITY)
Admission: RE | Admit: 2012-10-25 | Discharge: 2012-10-25 | Disposition: A | Discharge status: Home / Self Care | Payer: Medicare HMO | Source: Ambulatory Visit | Attending: Hematology & Oncology | Admitting: Hematology & Oncology

## 2012-10-25 NOTE — Progress Notes (Signed)
This office note has been dictated.

## 2012-11-06 ENCOUNTER — Emergency Department (HOSPITAL_BASED_OUTPATIENT_CLINIC_OR_DEPARTMENT_OTHER): Payer: Medicare Other

## 2012-11-06 ENCOUNTER — Encounter (HOSPITAL_BASED_OUTPATIENT_CLINIC_OR_DEPARTMENT_OTHER): Payer: Self-pay | Admitting: Emergency Medicine

## 2012-11-06 ENCOUNTER — Emergency Department (HOSPITAL_BASED_OUTPATIENT_CLINIC_OR_DEPARTMENT_OTHER)
Admission: EM | Admit: 2012-11-06 | Discharge: 2012-11-07 | Disposition: A | Discharge status: Other Acute Inpatient Hospital | Payer: Medicare Other | Attending: Emergency Medicine | Admitting: Emergency Medicine

## 2012-11-06 DIAGNOSIS — R059 Cough, unspecified: Secondary | ICD-10-CM | POA: Insufficient documentation

## 2012-11-06 DIAGNOSIS — R000 Tachycardia, unspecified: Secondary | ICD-10-CM | POA: Insufficient documentation

## 2012-11-06 DIAGNOSIS — Z8589 Personal history of malignant neoplasm of other organs and systems: Secondary | ICD-10-CM | POA: Insufficient documentation

## 2012-11-06 DIAGNOSIS — Z8619 Personal history of other infectious and parasitic diseases: Secondary | ICD-10-CM | POA: Insufficient documentation

## 2012-11-06 DIAGNOSIS — R042 Hemoptysis: Secondary | ICD-10-CM | POA: Insufficient documentation

## 2012-11-06 DIAGNOSIS — Z7982 Long term (current) use of aspirin: Secondary | ICD-10-CM | POA: Insufficient documentation

## 2012-11-06 DIAGNOSIS — J159 Unspecified bacterial pneumonia: Secondary | ICD-10-CM | POA: Insufficient documentation

## 2012-11-06 DIAGNOSIS — A419 Sepsis, unspecified organism: Secondary | ICD-10-CM | POA: Insufficient documentation

## 2012-11-06 DIAGNOSIS — Z79899 Other long term (current) drug therapy: Secondary | ICD-10-CM | POA: Insufficient documentation

## 2012-11-06 DIAGNOSIS — R0682 Tachypnea, not elsewhere classified: Secondary | ICD-10-CM | POA: Insufficient documentation

## 2012-11-06 DIAGNOSIS — E119 Type 2 diabetes mellitus without complications: Secondary | ICD-10-CM | POA: Insufficient documentation

## 2012-11-06 DIAGNOSIS — I10 Essential (primary) hypertension: Secondary | ICD-10-CM | POA: Insufficient documentation

## 2012-11-06 DIAGNOSIS — R1110 Vomiting, unspecified: Secondary | ICD-10-CM | POA: Insufficient documentation

## 2012-11-06 DIAGNOSIS — R062 Wheezing: Secondary | ICD-10-CM | POA: Insufficient documentation

## 2012-11-06 DIAGNOSIS — J189 Pneumonia, unspecified organism: Secondary | ICD-10-CM

## 2012-11-06 DIAGNOSIS — R319 Hematuria, unspecified: Secondary | ICD-10-CM | POA: Insufficient documentation

## 2012-11-06 DIAGNOSIS — R05 Cough: Secondary | ICD-10-CM | POA: Insufficient documentation

## 2012-11-06 HISTORY — DX: Essential (primary) hypertension: I10

## 2012-11-06 HISTORY — DX: Type 2 diabetes mellitus without complications: E11.9

## 2012-11-06 LAB — CG4 I-STAT (LACTIC ACID): Lactic Acid, Venous: 1.06 mmol/L (ref 0.5–2.2)

## 2012-11-06 LAB — CBC WITH DIFFERENTIAL/PLATELET
Basophils Relative: 0 % (ref 0–1)
Eosinophils Absolute: 0 10*3/uL (ref 0.0–0.7)
Eosinophils Relative: 0 % (ref 0–5)
HCT: 34.7 % — ABNORMAL LOW (ref 36.0–46.0)
Hemoglobin: 11.9 g/dL — ABNORMAL LOW (ref 12.0–15.0)
Lymphs Abs: 0.2 10*3/uL — ABNORMAL LOW (ref 0.7–4.0)
MCH: 27 pg (ref 26.0–34.0)
MCHC: 34.3 g/dL (ref 30.0–36.0)
MCV: 78.9 fL (ref 78.0–100.0)
Monocytes Absolute: 1 10*3/uL (ref 0.1–1.0)
Monocytes Relative: 12 % (ref 3–12)
Neutrophils Relative %: 85 % — ABNORMAL HIGH (ref 43–77)
RBC: 4.4 MIL/uL (ref 3.87–5.11)

## 2012-11-06 LAB — URINALYSIS, ROUTINE W REFLEX MICROSCOPIC
Glucose, UA: NEGATIVE mg/dL
Ketones, ur: 40 mg/dL — AB
Nitrite: NEGATIVE
Protein, ur: >300 mg/dL — AB
pH: 5.5 (ref 5.0–8.0)

## 2012-11-06 LAB — URINE MICROSCOPIC-ADD ON

## 2012-11-06 LAB — COMPREHENSIVE METABOLIC PANEL
Albumin: 3 g/dL — ABNORMAL LOW (ref 3.5–5.2)
Alkaline Phosphatase: 103 U/L (ref 39–117)
BUN: 10 mg/dL (ref 6–23)
Creatinine, Ser: 0.7 mg/dL (ref 0.50–1.10)
GFR calc Af Amer: >90 mL/min (ref >90)
Glucose, Bld: 156 mg/dL — ABNORMAL HIGH (ref 70–99)
Potassium: 3 mEq/L — ABNORMAL LOW (ref 3.5–5.1)
Total Protein: 6.4 g/dL (ref 6.0–8.3)

## 2012-11-06 MED ORDER — ACETAMINOPHEN 325 MG PO TABS
650.0000 mg | ORAL_TABLET | Freq: Once | ORAL | Status: AC
  Administered 2012-11-06: 650 mg via ORAL
  Filled 2012-11-06: qty 2

## 2012-11-06 MED ORDER — DEXTROSE 5 % IV SOLN
2.0000 ug/min | Freq: Once | INTRAVENOUS | Status: DC
  Filled 2012-11-06: qty 4

## 2012-11-06 MED ORDER — ALBUTEROL SULFATE (5 MG/ML) 0.5% IN NEBU: 10.0000 mg | INHALATION_SOLUTION | Freq: Once | RESPIRATORY_TRACT | Status: DC

## 2012-11-06 MED ORDER — AZITHROMYCIN 500 MG IV SOLR
500.0000 mg | Freq: Once | INTRAVENOUS | Status: AC
  Administered 2012-11-06: 500 mg via INTRAVENOUS
  Filled 2012-11-06: qty 500

## 2012-11-06 MED ORDER — NOREPINEPHRINE BITARTRATE 1 MG/ML IJ SOLN
INTRAMUSCULAR | Status: AC
  Administered 2012-11-06: 4 mg
  Filled 2012-11-06: qty 4

## 2012-11-06 MED ORDER — SODIUM CHLORIDE 0.9 % IV SOLN
1000.0000 mL | Freq: Once | INTRAVENOUS | Status: AC
  Administered 2012-11-06: 1000 mL via INTRAVENOUS

## 2012-11-06 MED ORDER — DEXTROSE 5 % IV SOLN
INTRAVENOUS | Status: AC
  Filled 2012-11-06: qty 250

## 2012-11-06 MED ORDER — SODIUM CHLORIDE 0.9 % IV SOLN: Freq: Once | INTRAVENOUS | Status: DC

## 2012-11-06 MED ORDER — SODIUM CHLORIDE 0.9 % IV SOLN
1000.0000 mL | INTRAVENOUS | Status: DC
  Administered 2012-11-06: 1000 mL via INTRAVENOUS

## 2012-11-06 MED ORDER — DEXTROSE 5 % IV SOLN
1.0000 g | Freq: Once | INTRAVENOUS | Status: AC
  Administered 2012-11-06: 1 g via INTRAVENOUS
  Filled 2012-11-06: qty 10

## 2012-11-06 NOTE — ED Notes (Signed)
Pt having generalized weakness with N/V, hemoptysis, hematuria since Sunday.  Pt receiving chemo for lymphoma and relates this has had this before.

## 2012-11-06 NOTE — ED Provider Notes (Signed)
CSN: [id number]     Arrival date & time 11/06/12  2028 History  This chart was scribed for Shanna Cisco, MD by Clydene Laming, ED Scribe. This patient was seen in room MH11/MH11 and the patient's care was started at 9:18 PM.    Chief Complaint  Patient presents with  . Weakness  . Hematuria  . Emesis  . Hemoptysis   Patient is a 79 y.o. female presenting with vomiting and cough. The history is provided by the patient. No language interpreter was used.  Emesis Associated symptoms: chills and myalgias   Associated symptoms: no abdominal pain, no arthralgias, no diarrhea, no headaches and no sore throat   Cough Cough characteristics:  Productive Sputum characteristics:  Bloody Severity:  Moderate Onset quality:  Sudden Duration:  2 days Timing:  Constant Progression:  Worsening Chronicity:  New Smoker: no   Context: not sick contacts   Relieved by:  Nothing Worsened by:  Nothing tried Ineffective treatments:  None tried Associated symptoms: chills, fever, myalgias and shortness of breath   Associated symptoms: no chest pain, no diaphoresis, no eye discharge, no headaches, no rhinorrhea, no sinus congestion, no sore throat and no wheezing   Fever:    Timing:  Intermittent   Temp source:  Subjective   Progression:  Waxing and waning Myalgias:    Location:  Generalized   Quality:  Aching   Severity:  Moderate   Onset quality:  Gradual   Duration:  2 days   Timing:  Constant   Progression:  Unchanged   HPI Comments: Kristine Sullivan is a 79 y.o. female who presents to the Emergency Department complaining of weakness that began two days ago with an associated cough that began yesterday. Pt states bowels "broke loose" yesterday and is experiencing hematuria, and hemoptysis.  Pt expresses fever and chills. Pt denies dysuria.  Pt has completed chemotherapy several months ago. She has a history of Lymphoma of the spleen. Pt sees [facility]. Patients expresses a  fever of  102.9. She expresses trouble breathing, chest pain, back pain.  Past Medical History  Diagnosis Date  . Anorexia   . Pneumonia, organism unspecified   . Other dyspnea and respiratory abnormality   . Thyrotoxicosis without mention of goiter or other cause, without mention of thyrotoxic crisis or storm   . Hypoxemia   . Intestinal infection due to other organism, not elsewhere classified   . 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 Hypertension   . Diabetes mellitus without complication    Past Surgical History  Procedure Laterality Date  . Cataract extraction     No family history on file. History  Substance Use Topics  . Smoking status: Never Smoker   . Smokeless tobacco: Not on file  . Alcohol Use: Not on file   OB History   Grav Para Term Preterm Abortions TAB SAB Ect Mult Living                 Review of Systems  Constitutional: Positive for fever, chills, activity change and appetite change. Negative for diaphoresis and fatigue.  HENT: Negative for congestion, sore throat, facial swelling, rhinorrhea, neck pain and neck stiffness.   Eyes: Negative for photophobia and discharge.  Respiratory: Positive for cough and shortness of breath. Negative for chest tightness and wheezing.   Cardiovascular: Negative for chest pain, palpitations and leg swelling.  Gastrointestinal: Positive for vomiting. Negative for nausea, abdominal pain and diarrhea.  Endocrine: Negative for polydipsia and polyuria.  Genitourinary: Positive for hematuria. Negative for dysuria, frequency, difficulty urinating and pelvic pain.  Musculoskeletal: Positive for myalgias. Negative for back pain and arthralgias.  Skin: Negative for color change and wound.  Allergic/Immunologic: Negative for immunocompromised state.  Neurological: Negative for facial asymmetry, weakness, numbness and headaches.  Hematological: Does not bruise/bleed easily.  Psychiatric/Behavioral: Negative for confusion and agitation.   All other systems reviewed and are negative.    Allergies  Review of patient's allergies indicates no known allergies.  Home Medications   Current Outpatient Rx  Name  Route  Sig  Dispense  Refill  . AMLODIPINE BESYLATE PO   Oral   Take 10 mg by mouth every morning.         [name] aspirin 325 MG tablet   Oral   Take 325 mg by mouth daily.         [name] atorvastatin (LIPITOR) 80 MG tablet   Oral   Take 40 mg by mouth daily.          . danazol (DANOCRINE) 100 MG capsule   Oral   Take 100 mg by mouth daily. For angioedema         . fish oil-omega-3 fatty acids 1000 MG capsule   Oral   Take 1 g by mouth daily.          . folic acid (FOLVITE) 1 MG tablet   Oral   Take 1 mg by mouth every morning.         . furosemide (LASIX) 40 MG tablet   Oral   Take 40 mg by mouth daily.         . hydrOXYzine (ATARAX/VISTARIL) 25 MG tablet   Oral   Take 25 mg by mouth at bedtime as needed for itching.          . lovastatin (MEVACOR) 40 MG tablet   Oral   Take 40 mg by mouth at bedtime.          . metFORMIN (GLUCOPHAGE) 500 MG tablet   Oral   Take 500 mg by mouth daily with breakfast.         . potassium chloride (K-DUR,KLOR-CON) 10 MEQ tablet   Oral   Take 10 mEq by mouth 2 (two) times daily.         . predniSONE (DELTASONE) 20 MG tablet   Oral   Take 20 mg by mouth daily. Take 5 days after chemo tx         . raloxifene (EVISTA) 60 MG tablet   Oral   Take 60 mg by mouth daily. PT ONLY TAKES MED. WHEN SHE REMEMBERS.         . timolol (TIMOPTIC) 0.5 % ophthalmic solution   Both Eyes   Place 1 drop into both eyes 2 (two) times daily.          . Travoprost, BAK Free, (TRAVATAN) 0.004 % SOLN ophthalmic solution   Both Eyes   Place 1 drop into both eyes at bedtime.         . triamterene-hydrochlorothiazide (DYAZIDE) 37.5-25 MG per capsule   Oral   Take 1 capsule by mouth every morning.           Triage Vitals: BP 171/54  Pulse 114   Temp(Src) 102.4 F (39.1 C) (Oral)  Resp 20  Ht 4\' 11"  (1.499 m)  Wt 170 lb (77.111 kg)  BMI 34.32 kg/m2  SpO2 96% Physical Exam  Nursing note and vitals reviewed. Constitutional: She  is oriented to person, place, and time. She appears well-developed and well-nourished. She appears distressed.  HENT:  Head: Normocephalic and atraumatic.  Mouth/Throat: Oropharynx is clear and moist. No oropharyngeal exudate.  Eyes: EOM are normal. Pupils are equal, round, and reactive to light.  Neck: Normal range of motion. Neck supple.  Cardiovascular: Regular rhythm and normal heart sounds.  Tachycardia present.  Exam reveals no gallop and no friction rub.   No murmur heard. Tachycardic heart rate  Pulmonary/Chest: Tachypnea noted. No respiratory distress. She has wheezes (In lower lumfields ). She has rales.  Abdominal: Soft. Bowel sounds are normal. She exhibits no distension and no mass. There is no tenderness. There is no rebound and no guarding.  Musculoskeletal: Normal range of motion. She exhibits no edema and no tenderness.  Neurological: She is alert and oriented to person, place, and time.  Skin: Skin is warm and dry.  Psychiatric: She has a normal mood and affect. Her behavior is normal.    ED Course  Procedures (including critical care time) DIAGNOSTIC STUDIES: Oxygen Saturation is 93% on RA, low by my interpretation.    COORDINATION OF CARE: 9:27 PM- Discussed treatment plan with pt at bedside. Pt verbalized understanding and agreement with plan.   Labs Review Labs Reviewed  CBC WITH DIFFERENTIAL - Abnormal; Notable for the following:    Hemoglobin 11.9 (*)    HCT 34.7 (*)    Platelets 118 (*)    Neutrophils Relative % 85 (*)    Lymphocytes Relative 3 (*)    Lymphs Abs 0.2 (*)    All other components within normal limits  COMPREHENSIVE METABOLIC PANEL - Abnormal; Notable for the following:    Sodium 131 (*)    Potassium 3.0 (*)    Chloride 92 (*)    Glucose, Bld 156 (*)     Albumin 3.0 (*)    AST 47 (*)    ALT 46 (*)    Total Bilirubin 1.5 (*)    GFR calc non Af Amer 80 (*)    All other components within normal limits  URINALYSIS, ROUTINE W REFLEX MICROSCOPIC - Abnormal; Notable for the following:    Color, Urine AMBER (*)    APPearance CLOUDY (*)    Hgb urine dipstick LARGE (*)    Bilirubin Urine MODERATE (*)    Ketones, ur 40 (*)    Protein, ur >300 (*)    Urobilinogen, UA 4.0 (*)    Leukocytes, UA TRACE (*)    All other components within normal limits  URINE MICROSCOPIC-ADD ON - Abnormal; Notable for the following:    Bacteria, UA FEW (*)    All other components within normal limits  CULTURE, BLOOD (ROUTINE X 2)  CULTURE, BLOOD (ROUTINE X 2)  URINE CULTURE  LACTIC ACID, PLASMA  CG4 I-STAT (LACTIC ACID)   Imaging Review Dg Chest Port 1 View  11/06/2012   CLINICAL DATA:  Weakness. Hematuria. Hemoptysis.  EXAM: PORTABLE CHEST - 1 VIEW  COMPARISON:  Chest CT 05/28/2012.  FINDINGS: Dense airspace consolidation in the left mid to lower lung partially obscuring both the left heart border and the left hemidiaphragm, concerning for lingular and left lower lobe pneumonia. Probable small left pleural effusion. Cephalization of the pulmonary vasculature, without frank pulmonary edema. Mild cardiomegaly. The patient is rotated to the right on today's exam, resulting in distortion of the mediastinal contours and reduced diagnostic sensitivity and specificity for mediastinal pathology. Right internal jugular single-lumen porta cath with tip terminating in  the superior cavoatrial junction.  IMPRESSION: 1. Findings concerning for left lower lobe and lingular pneumonia with small left parapneumonic pleural effusion. 2. Cardiomegaly with pulmonary venous congestion, but no frank pulmonary edema.   Electronically Signed   By: Trudie Reed M.D.   On: 11/06/2012 21:45    CRITICAL CARE Performed by: Toy Cookey, E Total critical care time: 35 Critical care time  was exclusive of separately billable procedures and treating other patients. Critical care was necessary to treat or prevent imminent or life-threatening deterioration. Critical care was time spent personally by me on the following activities: development of treatment plan with patient and/or surrogate as well as nursing, discussions with consultants, evaluation of patient's response to treatment, examination of patient, obtaining history from patient or surrogate, ordering and performing treatments and interventions, ordering and review of laboratory studies, ordering and review of radiographic studies, pulse oximetry and re-evaluation of patient's condition.  CENTRAL LINE Performed by: Toy Cookey, E Consent: The procedure was performed in an urgent situation, consent provided by son. Required items: required blood products, implants, devices, and special equipment available Patient identity confirmed: arm band and provided demographic data Time out: Immediately prior to procedure a "time out" was called to verify the correct patient, procedure, equipment, support staff and site/side marked as required. Indications: vascular access Anesthesia: local infiltration Local anesthetic: lidocaine 1% with epinephrine Anesthetic total: 3 ml Patient sedated: no Preparation: skin prepped with 2% chlorhexidine Skin prep agent dried: skin prep agent completely dried prior to procedure Sterile barriers: all five maximum sterile barriers used - cap, mask, sterile gown, sterile gloves, and large sterile sheet Hand hygiene: hand hygiene performed prior to central venous catheter insertion  Location details: L IJ  Catheter type: triple lumen Catheter size: 8 Fr Pre-procedure: landmarks identified Ultrasound guidance: yes Successful placement: yes Post-procedure: line sutured and dressing applied Assessment: blood return through all parts, free fluid flow, placement verified by x-ray and no  pneumothorax on x-ray Patient tolerance: Patient tolerated the procedure well with no immediate complications.   MDM   1. CAP (community acquired pneumonia)   2. Septic shock    Pt is a 79 y.o. female with Pmhx as above who presents with 2-3 days of fever, malaise, cough, dysuria, d/a, who on arrival found to have T 102.4, HR 114, 85% on RA, slight BL wheezing and crackles.  Pt mentating well, denies CP, ab pain.  W/U includes CXR which is c/w LLL and ligular pna w parapneumonia effusion and pulm vascular congestion.  WBC nml, Cr at baseline, Hb slightly worse than baseline at 11.9, mild AST, ATL, Tbili elevations.  LA 1.06.  Pt started on rocephin/azithro for CAP, given 2L NS.  BP then began trending down to mid 90's systolic.  HRP consulted for admission, they have requested CVC be placed and pt be sent to ICU.  L IJ CVC placed, pt started on levophed.  She tolerated procedure well had had good response to pressors.   1. CAP (community acquired pneumonia)   2. Septic shock      I personally performed the services described in this documentation, which was scribed in my presence. The recorded information has been reviewed and is accurate.    Shanna Cisco, MD 11/07/12 [phone]

## 2012-11-08 LAB — URINE CULTURE: Culture: NO GROWTH

## 2012-11-13 LAB — CULTURE, BLOOD (ROUTINE X 2): Culture: NO GROWTH

## 2012-11-20 ENCOUNTER — Other Ambulatory Visit (HOSPITAL_BASED_OUTPATIENT_CLINIC_OR_DEPARTMENT_OTHER): Payer: Medicare Other | Admitting: Lab

## 2012-11-20 ENCOUNTER — Ambulatory Visit (HOSPITAL_BASED_OUTPATIENT_CLINIC_OR_DEPARTMENT_OTHER): Payer: Medicare Other | Admitting: Hematology & Oncology

## 2012-11-20 ENCOUNTER — Ambulatory Visit (HOSPITAL_BASED_OUTPATIENT_CLINIC_OR_DEPARTMENT_OTHER): Payer: Medicare Other

## 2012-11-20 VITALS — BP 110/55 | HR 75 | Temp 97.5°F | Resp 16 | Ht 59.0 in | Wt 159.0 lb

## 2012-11-20 DIAGNOSIS — R110 Nausea: Secondary | ICD-10-CM

## 2012-11-20 DIAGNOSIS — C8307 Small cell B-cell lymphoma, spleen: Secondary | ICD-10-CM

## 2012-11-20 DIAGNOSIS — E876 Hypokalemia: Secondary | ICD-10-CM

## 2012-11-20 DIAGNOSIS — J189 Pneumonia, unspecified organism: Secondary | ICD-10-CM

## 2012-11-20 DIAGNOSIS — E860 Dehydration: Secondary | ICD-10-CM

## 2012-11-20 DIAGNOSIS — D649 Anemia, unspecified: Secondary | ICD-10-CM

## 2012-11-20 LAB — CMP (CANCER CENTER ONLY)
AST: 22 U/L (ref 11–38)
BUN, Bld: 12 mg/dL (ref 7–22)
CO2: 33 mEq/L (ref 18–33)
Calcium: 8.7 mg/dL (ref 8.0–10.3)
Chloride: 92 mEq/L — ABNORMAL LOW (ref 98–108)
Creat: 0.9 mg/dl (ref 0.6–1.2)
Total Bilirubin: 1 mg/dl (ref 0.20–1.60)

## 2012-11-20 LAB — RETICULOCYTES (CHCC)
ABS Retic: 68.6 10*3/uL (ref 19.0–186.0)
RBC.: 5.32 MIL/uL — ABNORMAL HIGH (ref 3.87–5.11)
Retic Ct Pct: 1.29 % (ref 0.4–2.3)

## 2012-11-20 LAB — CBC WITH DIFFERENTIAL (CANCER CENTER ONLY)
BASO%: 0.4 % (ref 0.0–2.0)
EOS%: 0.2 % (ref 0.0–7.0)
LYMPH#: 0.5 10*3/uL — ABNORMAL LOW (ref 0.9–3.3)
MCH: 26.6 pg (ref 26.0–34.0)
MCHC: 33.3 g/dL (ref 32.0–36.0)
MONO%: 11.2 % (ref 0.0–13.0)
NEUT#: 4.2 10*3/uL (ref 1.5–6.5)
Platelets: 124 10*3/uL — ABNORMAL LOW (ref 145–400)

## 2012-11-20 MED ORDER — PROMETHAZINE HCL 25 MG/ML IJ SOLN
INTRAMUSCULAR | Status: AC
  Filled 2012-11-20: qty 1

## 2012-11-20 MED ORDER — SODIUM CHLORIDE 0.9 % IJ SOLN
10.0000 mL | INTRAMUSCULAR | Status: DC | PRN
  Administered 2012-11-20: 10 mL via INTRAVENOUS
  Filled 2012-11-20: qty 10

## 2012-11-20 MED ORDER — PROMETHAZINE HCL 25 MG/ML IJ SOLN
12.5000 mg | Freq: Four times a day (QID) | INTRAMUSCULAR | Status: DC | PRN
  Administered 2012-11-20: 12.5 mg via INTRAVENOUS

## 2012-11-20 MED ORDER — HEPARIN SOD (PORK) LOCK FLUSH 100 UNIT/ML IV SOLN
500.0000 [IU] | Freq: Once | INTRAVENOUS | Status: AC
  Administered 2012-11-20: 500 [IU] via INTRAVENOUS
  Filled 2012-11-20: qty 5

## 2012-11-20 MED ORDER — PROMETHAZINE HCL 12.5 MG PO TABS: 12.5000 mg | ORAL_TABLET | Freq: Four times a day (QID) | ORAL | Status: DC | PRN

## 2012-11-20 MED ORDER — SODIUM CHLORIDE 0.9 % IV SOLN
Freq: Once | INTRAVENOUS | Status: AC
  Administered 2012-11-20: 13:00:00 via INTRAVENOUS

## 2012-11-20 NOTE — Progress Notes (Signed)
CC:   Kristine Level, MD  DIAGNOSIS:  Marginal zone lymphoma.  CURRENT THERAPY:  Maintenance Rituxan q.2 months.  INTERIM HISTORY:  Kristine Sullivan comes in for followup. Kristine Sullivan's due for her Rituxan. Kristine Sullivan was just discharged from the hospital last week. He had pneumonia. He still feels awful. Kristine Sullivan is on Zyvox for another 3 days.  I looked at her  medications. It seemed like there are medicine that Kristine Sullivan does not need. We went ahead and made some changes.  Kristine Sullivan feels nauseated. Kristine Sullivan seems a little dehydrated. I think Kristine Sullivan'll need some IV fluids. Kristine Sullivan's not had diarrhea. Kristine Sullivan's had no vomiting. Kristine Sullivan just feels weak. There has been no bleeding. Kristine Sullivan's had no fever.   Kristine Sullivan said that her pneumonia was in her life long. Kristine Sullivan states that Kristine Sullivan coughed up a little blood yesterday. Kristine Sullivan has had none since then.  There has been no rashes. Kristine Sullivan has had no headache. Kristine Sullivan has had no swallowing difficulties  Physical exam:  This is a slightly ill-appearing Afro-American female in no obvious distress. Vital signs show a temperature of 97 heart rate 75 and inspiratory rate 16 and blood pressure 110/55. Head and neck exam Kristine Sullivan is some dry oral mucosa. There is no mucositis. There is no adenopathy in her neck. Kristine Sullivan has no scleral icterus. Lungs are with some slight decrease at the bases. Kristine Sullivan has no wheezes. Cardiac exam regular rate and rhythm. No murmurs are noted. Abdomen is soft. Has good bowel sounds. There is no palpable liver or spleen. Extremities shows some nonpitting edema in the lower legs. Kristine Sullivan has decreased strength. Skin shows no rashes. Neurological exam no focal neurological deficits.  Laboratory studies:  White cell count 5.3. Hemoglobin 13.9. Platelet count 124. Potassium 2.9.  I looked at her blood smear and did not see any spherocytes. There were no schistocytes. White cells appear normal. Platelets were adequate.  Impression:  This is a 79 year old Afro-American female. Kristine Sullivan initially presented with severe  hemolytic anemia from her marginal zone lymphoma. We treated her with systemic chemotherapy with 8 cycles of R-CVP that was completed in February 2014. Kristine Sullivan went into remission. The hemolysis resolved. In addition, Kristine Sullivan has angioedema which resolved. I think the Rituxan help with this.  We will give her some IV fluids today. We need to have her increase her potassium. I talked to her about this. Kristine Sullivan will take 40 mEq daily for the next 5 days and then go back to 10 mEq a day.  We will hold the Rituxan today. We will give [place] in 2 weeks.  I told her to come back and to call us if Kristine Sullivan has any further issues.  We spent about 45 minutes with her today.

## 2012-11-20 NOTE — Progress Notes (Signed)
Dr. Myna Hidalgo notified of K 2.9. Gave patient  of her own supply. Instruct to take as directed tomorrow.  Verbalized understanding.

## 2012-11-20 NOTE — Patient Instructions (Signed)
Dehydration, Adult Dehydration is when you lose more fluids from the body than you take in. Vital organs like the kidneys, brain, and heart cannot function without a proper amount of fluids and salt. Any loss of fluids from the body can cause dehydration.  CAUSES   Vomiting.  Diarrhea.  Excessive sweating.  Excessive urine output.  Fever. SYMPTOMS  Mild dehydration  Thirst.  Dry lips.  Slightly dry mouth. Moderate dehydration  Very dry mouth.  Sunken eyes.  Skin does not bounce back quickly when lightly pinched and released.  Dark urine and decreased urine production.  Decreased tear production.  Headache. Severe dehydration  Very dry mouth.  Extreme thirst.  Rapid, weak pulse (more than 100 beats per minute at rest).  Cold hands and feet.  Not able to sweat in spite of heat and temperature.  Rapid breathing.  Blue lips.  Confusion and lethargy.  Difficulty being awakened.  Minimal urine production.  No tears. DIAGNOSIS  Your caregiver will diagnose dehydration based on your symptoms and your exam. Blood and urine tests will help confirm the diagnosis. The diagnostic evaluation should also identify the cause of dehydration. TREATMENT  Treatment of mild or moderate dehydration can often be done at home by increasing the amount of fluids that you drink. It is best to drink small amounts of fluid more often. Drinking too much at one time can make vomiting worse. Refer to the home care instructions below. Severe dehydration needs to be treated at the hospital where you will probably be given intravenous (IV) fluids that contain water and electrolytes. HOME CARE INSTRUCTIONS   Ask your caregiver about specific rehydration instructions.  Drink enough fluids to keep your urine clear or pale yellow.  Drink small amounts frequently if you have nausea and vomiting.  Eat as you normally do.  Avoid:  Foods or drinks high in sugar.  Carbonated  drinks.  Juice.  Extremely hot or cold fluids.  Drinks with caffeine.  Fatty, greasy foods.  Alcohol.  Tobacco.  Overeating.  Gelatin desserts.  Wash your hands well to avoid spreading bacteria and viruses.  Only take over-the-counter or prescription medicines for pain, discomfort, or fever as directed by your caregiver.  Ask your caregiver if you should continue all prescribed and over-the-counter medicines.  Keep all follow-up appointments with your caregiver. SEEK MEDICAL CARE IF:  You have abdominal pain and it increases or stays in one area (localizes).  You have a rash, stiff neck, or severe headache.  You are irritable, sleepy, or difficult to awaken.  You are weak, dizzy, or extremely thirsty. SEEK IMMEDIATE MEDICAL CARE IF:   You are unable to keep fluids down or you get worse despite treatment.  You have frequent episodes of vomiting or diarrhea.  You have blood or green matter (bile) in your vomit.  You have blood in your stool or your stool looks black and tarry.  You have not urinated in 6 to 8 hours, or you have only urinated a small amount of very dark urine.  You have a fever.  You faint. MAKE SURE YOU:   Understand these instructions.  Will watch your condition.  Will get help right away if you are not doing well or get worse. Document Released: 02/07/2005 Document Revised: 05/02/2011 Document Reviewed: 09/27/2010 [employer] Patient Information 2014 [employer].  

## 2012-11-22 ENCOUNTER — Ambulatory Visit (HOSPITAL_COMMUNITY)
Admission: RE | Admit: 2012-11-22 | Discharge: 2012-11-22 | Disposition: A | Discharge status: Home / Self Care | Payer: Medicare HMO | Source: Ambulatory Visit | Attending: Hematology & Oncology | Admitting: Hematology & Oncology

## 2012-12-03 DIAGNOSIS — B372 Candidiasis of skin and nail: Secondary | ICD-10-CM | POA: Insufficient documentation

## 2012-12-28 ENCOUNTER — Ambulatory Visit (HOSPITAL_COMMUNITY)
Admission: RE | Admit: 2012-12-28 | Discharge: 2012-12-28 | Disposition: A | Discharge status: Home / Self Care | Payer: Medicare HMO | Source: Ambulatory Visit | Attending: Hematology & Oncology | Admitting: Hematology & Oncology

## 2013-01-25 ENCOUNTER — Ambulatory Visit (HOSPITAL_COMMUNITY)
Admission: RE | Admit: 2013-01-25 | Discharge: 2013-01-25 | Disposition: A | Discharge status: Home / Self Care | Payer: Medicare HMO | Source: Ambulatory Visit | Attending: Hematology & Oncology | Admitting: Hematology & Oncology

## 2013-02-18 DIAGNOSIS — I10 Essential (primary) hypertension: Secondary | ICD-10-CM | POA: Insufficient documentation

## 2013-02-18 DIAGNOSIS — E1149 Type 2 diabetes mellitus with other diabetic neurological complication: Secondary | ICD-10-CM | POA: Insufficient documentation

## 2013-02-18 DIAGNOSIS — I4891 Unspecified atrial fibrillation: Secondary | ICD-10-CM | POA: Insufficient documentation

## 2013-02-18 DIAGNOSIS — D696 Thrombocytopenia, unspecified: Secondary | ICD-10-CM | POA: Insufficient documentation

## 2013-02-18 DIAGNOSIS — E559 Vitamin D deficiency, unspecified: Secondary | ICD-10-CM | POA: Insufficient documentation

## 2013-02-18 DIAGNOSIS — Z7901 Long term (current) use of anticoagulants: Secondary | ICD-10-CM | POA: Insufficient documentation

## 2013-02-18 DIAGNOSIS — R17 Unspecified jaundice: Secondary | ICD-10-CM | POA: Insufficient documentation

## 2013-02-18 DIAGNOSIS — Z9229 Personal history of other drug therapy: Secondary | ICD-10-CM | POA: Insufficient documentation

## 2013-02-22 ENCOUNTER — Ambulatory Visit (HOSPITAL_COMMUNITY)
Admission: RE | Admit: 2013-02-22 | Discharge: 2013-02-22 | Disposition: A | Discharge status: Home / Self Care | Payer: Medicare HMO | Source: Ambulatory Visit | Attending: Hematology & Oncology | Admitting: Hematology & Oncology

## 2013-03-06 ENCOUNTER — Ambulatory Visit (HOSPITAL_BASED_OUTPATIENT_CLINIC_OR_DEPARTMENT_OTHER): Payer: Medicare HMO

## 2013-03-06 ENCOUNTER — Other Ambulatory Visit (HOSPITAL_BASED_OUTPATIENT_CLINIC_OR_DEPARTMENT_OTHER): Payer: Medicare HMO | Admitting: Lab

## 2013-03-06 ENCOUNTER — Ambulatory Visit (HOSPITAL_BASED_OUTPATIENT_CLINIC_OR_DEPARTMENT_OTHER): Payer: Medicare HMO | Admitting: Hematology & Oncology

## 2013-03-06 ENCOUNTER — Encounter: Payer: Self-pay | Admitting: Hematology & Oncology

## 2013-03-06 ENCOUNTER — Other Ambulatory Visit: Payer: Medicare Other | Admitting: Lab

## 2013-03-06 ENCOUNTER — Ambulatory Visit: Payer: Medicare Other | Admitting: Hematology & Oncology

## 2013-03-06 ENCOUNTER — Ambulatory Visit: Payer: Medicare Other

## 2013-03-06 VITALS — BP 109/61 | HR 77 | Temp 96.6°F | Resp 18

## 2013-03-06 VITALS — BP 121/57 | HR 80 | Temp 97.6°F | Resp 14 | Ht <= 58 in | Wt 168.0 lb

## 2013-03-06 DIAGNOSIS — C8307 Small cell B-cell lymphoma, spleen: Secondary | ICD-10-CM

## 2013-03-06 DIAGNOSIS — D649 Anemia, unspecified: Secondary | ICD-10-CM

## 2013-03-06 DIAGNOSIS — Z5112 Encounter for antineoplastic immunotherapy: Secondary | ICD-10-CM

## 2013-03-06 DIAGNOSIS — R110 Nausea: Secondary | ICD-10-CM

## 2013-03-06 LAB — CBC WITH DIFFERENTIAL (CANCER CENTER ONLY)
BASO#: 0 10*3/uL (ref 0.0–0.2)
BASO%: 0.2 % (ref 0.0–2.0)
EOS%: 2.1 % (ref 0.0–7.0)
Eosinophils Absolute: 0.1 10*3/uL (ref 0.0–0.5)
HEMATOCRIT: 40.8 % (ref 34.8–46.6)
HEMOGLOBIN: 13.1 g/dL (ref 11.6–15.9)
LYMPH#: 0.9 10*3/uL (ref 0.9–3.3)
LYMPH%: 17.1 % (ref 14.0–48.0)
MCH: 26.2 pg (ref 26.0–34.0)
MCHC: 32.1 g/dL (ref 32.0–36.0)
MCV: 82 fL (ref 81–101)
MONO#: 0.6 10*3/uL (ref 0.1–0.9)
MONO%: 12.3 % (ref 0.0–13.0)
NEUT%: 68.3 % (ref 39.6–80.0)
NEUTROS ABS: 3.6 10*3/uL (ref 1.5–6.5)
Platelets: 137 10*3/uL — ABNORMAL LOW (ref 145–400)
RBC: 5 10*6/uL (ref 3.70–5.32)
RDW: 14.8 % (ref 11.1–15.7)
WBC: 5.2 10*3/uL (ref 3.9–10.0)

## 2013-03-06 LAB — CMP (CANCER CENTER ONLY)
ALBUMIN: 3.7 g/dL (ref 3.3–5.5)
ALT: 33 U/L (ref 10–47)
AST: 27 U/L (ref 11–38)
Alkaline Phosphatase: 126 U/L — ABNORMAL HIGH (ref 26–84)
BILIRUBIN TOTAL: 0.9 mg/dL (ref 0.20–1.60)
BUN, Bld: 15 mg/dL (ref 7–22)
CALCIUM: 9 mg/dL (ref 8.0–10.3)
CHLORIDE: 99 meq/L (ref 98–108)
CO2: 35 meq/L — AB (ref 18–33)
Creat: 0.6 mg/dl (ref 0.6–1.2)
Glucose, Bld: 121 mg/dL — ABNORMAL HIGH (ref 73–118)
Potassium: 3.4 mEq/L (ref 3.3–4.7)
SODIUM: 141 meq/L (ref 128–145)
TOTAL PROTEIN: 6.6 g/dL (ref 6.4–8.1)

## 2013-03-06 MED ORDER — HEPARIN SOD (PORK) LOCK FLUSH 100 UNIT/ML IV SOLN
500.0000 [IU] | Freq: Once | INTRAVENOUS | Status: AC
  Administered 2013-03-06: 500 [IU] via INTRAVENOUS
  Filled 2013-03-06: qty 5

## 2013-03-06 MED ORDER — DIPHENHYDRAMINE HCL 25 MG PO CAPS
50.0000 mg | ORAL_CAPSULE | Freq: Once | ORAL | Status: AC
  Administered 2013-03-06: 50 mg via ORAL

## 2013-03-06 MED ORDER — ACETAMINOPHEN 325 MG PO TABS
650.0000 mg | ORAL_TABLET | Freq: Once | ORAL | Status: AC
  Administered 2013-03-06: 650 mg via ORAL

## 2013-03-06 MED ORDER — RITUXIMAB CHEMO INJECTION 10 MG/ML
375.0000 mg/m2 | Freq: Once | INTRAVENOUS | Status: AC
  Administered 2013-03-06: 700 mg via INTRAVENOUS
  Filled 2013-03-06: qty 70

## 2013-03-06 MED ORDER — SODIUM CHLORIDE 0.9 % IV SOLN
Freq: Once | INTRAVENOUS | Status: AC
  Administered 2013-03-06: 09:00:00 via INTRAVENOUS

## 2013-03-06 MED ORDER — SODIUM CHLORIDE 0.9 % IJ SOLN
10.0000 mL | INTRAMUSCULAR | Status: DC | PRN
  Administered 2013-03-06: 10 mL via INTRAVENOUS
  Filled 2013-03-06: qty 10

## 2013-03-06 MED ORDER — DIPHENHYDRAMINE HCL 25 MG PO CAPS
ORAL_CAPSULE | ORAL | Status: AC
  Filled 2013-03-06: qty 2

## 2013-03-06 MED ORDER — ACETAMINOPHEN 325 MG PO TABS
ORAL_TABLET | ORAL | Status: AC
  Filled 2013-03-06: qty 2

## 2013-03-06 NOTE — Patient Instructions (Signed)

## 2013-03-06 NOTE — Progress Notes (Signed)
This office note has been dictated.

## 2013-03-07 LAB — RETICULOCYTES (CHCC)
ABS Retic: 74.6 10*3/uL (ref 19.0–186.0)
RBC.: 4.97 MIL/uL (ref 3.87–5.11)
Retic Ct Pct: 1.5 % (ref 0.4–2.3)

## 2013-03-07 LAB — DIRECT ANTIGLOBULIN TEST (NOT AT ARMC)
DAT (Complement): NEGATIVE
DAT IgG: NEGATIVE

## 2013-03-07 LAB — LACTATE DEHYDROGENASE: LDH: 241 U/L (ref 94–250)

## 2013-03-07 LAB — HAPTOGLOBIN: HAPTOGLOBIN: 203 mg/dL (ref 45–215)

## 2013-03-07 NOTE — Progress Notes (Signed)
CC:   Fortunato Curling, MD  DIAGNOSIS:  Marginal zone lymphoma.  CURRENT THERAPY:  Maintenance Rituxan - q.2 months dosing.  INTERIM HISTORY:  Ms. Iovine comes in for followup.  She is doing quite well.  When we last saw her, she had pneumonia.  She recovered from this with no problems. She has had no problems with fever, sweats, or chills.  She has had no cough with shortness of breath.  There is no nausea or vomiting.  There has been no rashes.  She has had no leg swelling.  PHYSICAL EXAMINATION:  General:  This is a well-developed, well- nourished black female, in no obvious distress.  Vital Signs: Temperature of 97.6, pulse 80, respiratory rate 14, blood pressure 121/57.  Weight is 168 pounds.  Head and Neck:  Normocephalic, atraumatic skull.  There are no ocular or oral lesions.  There are no palpable cervical or supraclavicular lymph nodes.  Lungs:  Clear bilaterally.  Cardiac:  Regular rate and rhythm with normal S1, S2. There are no murmurs, rubs, or bruits.  Abdomen:  Soft.  She has good bowel sounds.  There is no fluid wave.  There is no palpable abdominal mass.  There is no palpable hepatosplenomegaly.  Back:  No tenderness over the spine, ribs, or hips.  Extremities:  Shows no clubbing, cyanosis, or edema.  Neurological:  Shows no focal neurological deficits.  LABORATORY STUDIES:  White cell count 5.2, hemoglobin 13, hematocrit 41, platelet count 137.  MCV is 82.  IMPRESSION:  Ms. Flaum is a very charming 80 year old African American female.  She has a history of marginal zone lymphoma.  We went ahead and treated this with eight cycles of R-CVP.  She completed this in February of 2014.  We have 2 years of maintenance Rituxan.  Of note, when we started her treatments, she had hereditary angioedema. This has pretty much resolved.  We will continue with the Rituxan.  We did make some medicine adjustments, on quite a few medications, some that she does not need from my  point of view.  I will plan to get her back in another 2 months for followup.    ______________________________ [name], M.D. PRE/MEDQ  D:  03/06/2013  T:  03/07/2013  Job:  [id number]

## 2013-04-01 ENCOUNTER — Ambulatory Visit (HOSPITAL_COMMUNITY)
Admission: RE | Admit: 2013-04-01 | Discharge: 2013-04-01 | Disposition: A | Discharge status: Home / Self Care | Payer: Medicare HMO | Source: Ambulatory Visit | Attending: Hematology & Oncology | Admitting: Hematology & Oncology

## 2013-04-24 ENCOUNTER — Ambulatory Visit (HOSPITAL_COMMUNITY)
Admission: RE | Admit: 2013-04-24 | Discharge: 2013-04-24 | Disposition: A | Discharge status: Home / Self Care | Payer: Medicare HMO | Source: Ambulatory Visit | Attending: Hematology & Oncology | Admitting: Hematology & Oncology

## 2013-05-01 ENCOUNTER — Telehealth: Payer: Self-pay | Admitting: Hematology & Oncology

## 2013-05-01 ENCOUNTER — Ambulatory Visit: Payer: Medicare Other

## 2013-05-01 ENCOUNTER — Other Ambulatory Visit: Payer: Medicare Other | Admitting: Lab

## 2013-05-01 ENCOUNTER — Ambulatory Visit: Payer: Medicare Other | Admitting: Hematology & Oncology

## 2013-05-01 NOTE — Telephone Encounter (Signed)
Pt cx 3-11 is aware RN will call her after she talks to Dr. Marin Olp

## 2013-05-02 ENCOUNTER — Telehealth: Payer: Self-pay | Admitting: Hematology & Oncology

## 2013-05-02 NOTE — Telephone Encounter (Signed)
Pt aware of 3-19 tx and 4-29 MD appointments. Dates are per MD

## 2013-05-09 ENCOUNTER — Ambulatory Visit (HOSPITAL_BASED_OUTPATIENT_CLINIC_OR_DEPARTMENT_OTHER): Payer: Medicare HMO

## 2013-05-09 ENCOUNTER — Other Ambulatory Visit (HOSPITAL_BASED_OUTPATIENT_CLINIC_OR_DEPARTMENT_OTHER): Payer: Medicare HMO | Admitting: Lab

## 2013-05-09 ENCOUNTER — Telehealth: Payer: Self-pay | Admitting: Hematology & Oncology

## 2013-05-09 VITALS — BP 121/70 | HR 68 | Temp 97.2°F | Resp 20

## 2013-05-09 DIAGNOSIS — C8307 Small cell B-cell lymphoma, spleen: Secondary | ICD-10-CM

## 2013-05-09 DIAGNOSIS — Z5112 Encounter for antineoplastic immunotherapy: Secondary | ICD-10-CM

## 2013-05-09 LAB — CBC WITH DIFFERENTIAL (CANCER CENTER ONLY)
BASO#: 0 10*3/uL (ref 0.0–0.2)
BASO%: 0.6 % (ref 0.0–2.0)
EOS%: 2.4 % (ref 0.0–7.0)
Eosinophils Absolute: 0.1 10*3/uL (ref 0.0–0.5)
HEMATOCRIT: 42.6 % (ref 34.8–46.6)
HEMOGLOBIN: 14.1 g/dL (ref 11.6–15.9)
LYMPH#: 0.8 10*3/uL — ABNORMAL LOW (ref 0.9–3.3)
LYMPH%: 14.9 % (ref 14.0–48.0)
MCH: 26.5 pg (ref 26.0–34.0)
MCHC: 33.1 g/dL (ref 32.0–36.0)
MCV: 80 fL — ABNORMAL LOW (ref 81–101)
MONO#: 0.5 10*3/uL (ref 0.1–0.9)
MONO%: 9.4 % (ref 0.0–13.0)
NEUT#: 3.7 10*3/uL (ref 1.5–6.5)
NEUT%: 72.7 % (ref 39.6–80.0)
Platelets: 159 10*3/uL (ref 145–400)
RBC: 5.32 10*6/uL (ref 3.70–5.32)
RDW: 14.6 % (ref 11.1–15.7)
WBC: 5.1 10*3/uL (ref 3.9–10.0)

## 2013-05-09 LAB — CMP (CANCER CENTER ONLY)
ALT: 22 U/L (ref 10–47)
AST: 32 U/L (ref 11–38)
Albumin: 4.1 g/dL (ref 3.3–5.5)
Alkaline Phosphatase: 116 U/L — ABNORMAL HIGH (ref 26–84)
BILIRUBIN TOTAL: 0.9 mg/dL (ref 0.20–1.60)
BUN: 15 mg/dL (ref 7–22)
CHLORIDE: 92 meq/L — AB (ref 98–108)
CO2: 35 meq/L — AB (ref 18–33)
CREATININE: 0.7 mg/dL (ref 0.6–1.2)
Calcium: 9.1 mg/dL (ref 8.0–10.3)
GLUCOSE: 143 mg/dL — AB (ref 73–118)
Potassium: 2.6 mEq/L — CL (ref 3.3–4.7)
Sodium: 137 mEq/L (ref 128–145)
TOTAL PROTEIN: 6.6 g/dL (ref 6.4–8.1)

## 2013-05-09 LAB — LACTATE DEHYDROGENASE: LDH: 234 U/L (ref 94–250)

## 2013-05-09 MED ORDER — SODIUM CHLORIDE 0.9 % IV SOLN
Freq: Once | INTRAVENOUS | Status: AC
  Administered 2013-05-09: 10:00:00 via INTRAVENOUS

## 2013-05-09 MED ORDER — DIPHENHYDRAMINE HCL 25 MG PO CAPS
ORAL_CAPSULE | ORAL | Status: AC
  Filled 2013-05-09: qty 2

## 2013-05-09 MED ORDER — HEPARIN SOD (PORK) LOCK FLUSH 100 UNIT/ML IV SOLN
500.0000 [IU] | Freq: Once | INTRAVENOUS | Status: AC | PRN
  Administered 2013-05-09: 500 [IU]
  Filled 2013-05-09: qty 5

## 2013-05-09 MED ORDER — ACETAMINOPHEN 325 MG PO TABS
650.0000 mg | ORAL_TABLET | Freq: Once | ORAL | Status: AC
  Administered 2013-05-09: 650 mg via ORAL

## 2013-05-09 MED ORDER — SODIUM CHLORIDE 0.9 % IJ SOLN
10.0000 mL | INTRAMUSCULAR | Status: DC | PRN
  Administered 2013-05-09: 10 mL
  Filled 2013-05-09: qty 10

## 2013-05-09 MED ORDER — ACETAMINOPHEN 325 MG PO TABS
ORAL_TABLET | ORAL | Status: AC
  Filled 2013-05-09: qty 2

## 2013-05-09 MED ORDER — SODIUM CHLORIDE 0.9 % IV SOLN
375.0000 mg/m2 | Freq: Once | INTRAVENOUS | Status: AC
  Administered 2013-05-09: 700 mg via INTRAVENOUS
  Filled 2013-05-09: qty 70

## 2013-05-09 MED ORDER — DIPHENHYDRAMINE HCL 25 MG PO CAPS
50.0000 mg | ORAL_CAPSULE | Freq: Once | ORAL | Status: AC
  Administered 2013-05-09: 50 mg via ORAL

## 2013-05-09 NOTE — Patient Instructions (Signed)
[facility] Discharge Instructions for Patients Receiving Chemotherapy  Today you received the following chemotherapy agents Rituxan.  To help prevent nausea and vomiting after your treatment, we encourage you to take your nausea medication as prescribed.   If you develop nausea and vomiting that is not controlled by your nausea medication, call the clinic.   BELOW ARE SYMPTOMS THAT SHOULD BE REPORTED IMMEDIATELY:  *FEVER GREATER THAN 100.5 F  *CHILLS WITH OR WITHOUT FEVER  NAUSEA AND VOMITING THAT IS NOT CONTROLLED WITH YOUR NAUSEA MEDICATION  *UNUSUAL SHORTNESS OF BREATH  *UNUSUAL BRUISING OR BLEEDING  TENDERNESS IN MOUTH AND THROAT WITH OR WITHOUT PRESENCE OF ULCERS  *URINARY PROBLEMS  *BOWEL PROBLEMS  UNUSUAL RASH Items with * indicate a potential emergency and should be followed up as soon as possible.  Feel free to call the clinic you have any questions or concerns. The clinic phone number is [phone].    

## 2013-05-09 NOTE — Progress Notes (Signed)
Dr Marin Olp aware of critical low K+. Pt instructed to increase KCl to 25meq daily x 7 days, then 80meq daily by Osborne Oman, RN. dph

## 2013-05-09 NOTE — Telephone Encounter (Signed)
[place]: [id number] Status: Approved Dates: 03/06/2013 - 06/04/2013 CPT: Y3016; W1093

## 2013-05-23 ENCOUNTER — Ambulatory Visit (HOSPITAL_COMMUNITY)
Admission: RE | Admit: 2013-05-23 | Discharge: 2013-05-23 | Disposition: A | Discharge status: Home / Self Care | Payer: Medicare HMO | Source: Ambulatory Visit | Attending: Hematology & Oncology | Admitting: Hematology & Oncology

## 2013-06-19 ENCOUNTER — Ambulatory Visit (HOSPITAL_BASED_OUTPATIENT_CLINIC_OR_DEPARTMENT_OTHER): Payer: Medicare HMO | Admitting: Hematology & Oncology

## 2013-06-19 ENCOUNTER — Encounter: Payer: Self-pay | Admitting: Hematology & Oncology

## 2013-06-19 ENCOUNTER — Other Ambulatory Visit (HOSPITAL_BASED_OUTPATIENT_CLINIC_OR_DEPARTMENT_OTHER): Payer: Medicare HMO | Admitting: Lab

## 2013-06-19 VITALS — BP 151/70 | HR 70 | Temp 97.4°F | Resp 14 | Ht 61.0 in | Wt 169.0 lb

## 2013-06-19 DIAGNOSIS — C8307 Small cell B-cell lymphoma, spleen: Secondary | ICD-10-CM

## 2013-06-19 DIAGNOSIS — R110 Nausea: Secondary | ICD-10-CM

## 2013-06-19 DIAGNOSIS — D649 Anemia, unspecified: Secondary | ICD-10-CM

## 2013-06-19 LAB — CBC WITH DIFFERENTIAL (CANCER CENTER ONLY)
BASO#: 0 10*3/uL (ref 0.0–0.2)
BASO%: 0.2 % (ref 0.0–2.0)
EOS ABS: 0.1 10*3/uL (ref 0.0–0.5)
EOS%: 1.7 % (ref 0.0–7.0)
HEMATOCRIT: 39.5 % (ref 34.8–46.6)
HEMOGLOBIN: 13.1 g/dL (ref 11.6–15.9)
LYMPH#: 0.8 10*3/uL — AB (ref 0.9–3.3)
LYMPH%: 15.8 % (ref 14.0–48.0)
MCH: 26.8 pg (ref 26.0–34.0)
MCHC: 33.2 g/dL (ref 32.0–36.0)
MCV: 81 fL (ref 81–101)
MONO#: 0.5 10*3/uL (ref 0.1–0.9)
MONO%: 9.6 % (ref 0.0–13.0)
NEUT%: 72.7 % (ref 39.6–80.0)
NEUTROS ABS: 3.9 10*3/uL (ref 1.5–6.5)
Platelets: 128 10*3/uL — ABNORMAL LOW (ref 145–400)
RBC: 4.88 10*6/uL (ref 3.70–5.32)
RDW: 14.3 % (ref 11.1–15.7)
WBC: 5.3 10*3/uL (ref 3.9–10.0)

## 2013-06-19 LAB — BASIC METABOLIC PANEL - CANCER CENTER ONLY
BUN: 13 mg/dL (ref 7–22)
CHLORIDE: 100 meq/L (ref 98–108)
CO2: 32 mEq/L (ref 18–33)
Calcium: 9.3 mg/dL (ref 8.0–10.3)
Creat: 0.9 mg/dl (ref 0.6–1.2)
Glucose, Bld: 129 mg/dL — ABNORMAL HIGH (ref 73–118)
Potassium: 3.3 mEq/L (ref 3.3–4.7)
Sodium: 140 mEq/L (ref 128–145)

## 2013-06-19 NOTE — Progress Notes (Signed)
Hematology and Oncology Follow Up Visit  Kristine Sullivan [id number] 05/25/1931 80 y.o. 06/19/2013   Principle Diagnosis:   Marginal zone lymphoma  Current Therapy:   maintenance Rituxan-every 3 month dosing    Interim History:  Kristine Sullivan is back for followup. She is doing okay. She feels well. She's doing nicely on the Rituxan. She's had no issues with infection. She's had no bleeding. Her appetites been good. She's had no change in bowel or bladder habits. There's been no rashes. She's had no issues with her diabetes. She's had no weight loss weight gain.  Medications: Current outpatient prescriptions:aspirin 81 MG tablet, Take 81 mg by mouth daily., Disp: , Rfl: ;  atorvastatin (LIPITOR) 80 MG tablet, Take 40 mg by mouth daily. , Disp: , Rfl: ;  Calcium Carb-Cholecalciferol (CALCIUM + D3) 600-200 MG-UNIT TABS, Take by mouth every morning., Disp: , Rfl: ;  furosemide (LASIX) 40 MG tablet, Take 40 mg by mouth daily., Disp: , Rfl:  metFORMIN (GLUCOPHAGE) 500 MG tablet, Take 500 mg by mouth daily with breakfast., Disp: , Rfl: ;  Olopatadine HCl (PATADAY) 0.2 % SOLN, Apply to eye every morning., Disp: , Rfl: ;  potassium chloride (K-DUR,KLOR-CON) 10 MEQ tablet, Take 10 mEq by mouth daily. , Disp: , Rfl: ;  promethazine (PHENERGAN) 12.5 MG tablet, Take 1 tablet (12.5 mg total) by mouth every 6 (six) hours as needed for nausea., Disp: 60 tablet, Rfl: 2 raloxifene (EVISTA) 60 MG tablet, Take 60 mg by mouth daily., Disp: , Rfl: ;  timolol (TIMOPTIC) 0.5 % ophthalmic solution, 1 drop 2 (two) times daily., Disp: , Rfl: ;  tiZANidine (ZANAFLEX) 4 MG capsule, Take 4 mg by mouth at bedtime., Disp: , Rfl:  No current facility-administered medications for this visit. Facility-Administered Medications Ordered in Other Visits: 0.9 %  sodium chloride infusion, 250 mL, Intravenous, Once, [name], MD  Allergies: No Known Allergies  Past Medical History, Surgical history, Social history, and Family History  were reviewed and updated.  Review of Systems: As above  Physical Exam:  height is 5\' 1"  (1.549 m) and weight is 169 lb (76.658 kg). Her oral temperature is 97.4 F (36.3 C). Her blood pressure is 151/70 and her pulse is 70. Her respiration is 14.   Well-developed and well-nourished African American female. Lungs. Clear. Cardiac exam regular rate and rhythm with no murmurs rubs or bruits. Abdomen soft. No liver or spleen. Lymph nodes nonpalpable. Back exam no tenderness. Extremities no clubbing cyanosis or edema. Skin exam no rashes. Neurological exam nonfocal.  Lab Results  Component Value Date   WBC 5.3 06/19/2013   HGB 13.1 06/19/2013   HCT 39.5 06/19/2013   MCV 81 06/19/2013   PLT 128* 06/19/2013     Chemistry      Component Value Date/Time   NA 140 06/19/2013 0754   NA 131* 11/06/2012 2050   NA 139 05/28/2012 1036   K 3.3 06/19/2013 0754   K 3.0* 11/06/2012 2050   K 3.7 05/28/2012 1036   CL 100 06/19/2013 0754   CL 92* 11/06/2012 2050   CL 99 05/28/2012 1036   CO2 32 06/19/2013 0754   CO2 26 11/06/2012 2050   CO2 31* 05/28/2012 1036   BUN 13 06/19/2013 0754   BUN 10 11/06/2012 2050   BUN 12.3 05/28/2012 1036   CREATININE 0.9 06/19/2013 0754   CREATININE 0.70 11/06/2012 2050   CREATININE 0.7 05/28/2012 1036      Component Value Date/Time  CALCIUM 9.3 06/19/2013 0754   CALCIUM 8.7 11/06/2012 2050   CALCIUM 9.3 05/28/2012 1036   ALKPHOS 116* 05/09/2013 0909   ALKPHOS 103 11/06/2012 2050   AST 32 05/09/2013 0909   AST 47* 11/06/2012 2050   ALT 22 05/09/2013 0909   ALT 46* 11/06/2012 2050   BILITOT 0.90 05/09/2013 0909   BILITOT 1.5* 11/06/2012 2050         Impression and Plan: Kristine Sullivan is 80 year old African American female with marginal zone lymphoma. She was treated initially with chemotherapy. She 8 cycles of R.-C`VP. She completed this in February of 2014. He now is on Rituxan. She'll get this for a total of 2 years. He is not due until June.  We will see her back then. I don't see need  for any x-rays right now.   [name], MD 4/29/20157:03 PM

## 2013-06-20 LAB — IGG, IGA, IGM
IgA: 100 mg/dL (ref 69–380)
IgG (Immunoglobin G), Serum: 557 mg/dL — ABNORMAL LOW (ref 690–1700)
IgM, Serum: 95 mg/dL (ref 52–322)

## 2013-06-20 LAB — LACTATE DEHYDROGENASE: LDH: 214 U/L (ref 94–250)

## 2013-06-25 ENCOUNTER — Telehealth: Payer: Self-pay | Admitting: Hematology & Oncology

## 2013-06-25 NOTE — Telephone Encounter (Signed)
Faxed Medical Records via fax today  to:  [facility] - I[facility]a Mori [phone] Fx: [phone]  Medical  Records requested from last labs and ovn   CONSENT COPY SCANNED

## 2013-08-14 ENCOUNTER — Ambulatory Visit (HOSPITAL_BASED_OUTPATIENT_CLINIC_OR_DEPARTMENT_OTHER)
Admission: RE | Admit: 2013-08-14 | Discharge: 2013-08-14 | Disposition: A | Discharge status: Home / Self Care | Payer: Medicare HMO | Source: Ambulatory Visit | Attending: Hematology & Oncology | Admitting: Hematology & Oncology

## 2013-08-14 ENCOUNTER — Ambulatory Visit (HOSPITAL_BASED_OUTPATIENT_CLINIC_OR_DEPARTMENT_OTHER): Payer: Medicare HMO | Admitting: Hematology & Oncology

## 2013-08-14 ENCOUNTER — Other Ambulatory Visit (HOSPITAL_BASED_OUTPATIENT_CLINIC_OR_DEPARTMENT_OTHER): Payer: Medicare HMO | Admitting: Lab

## 2013-08-14 ENCOUNTER — Telehealth: Payer: Self-pay | Admitting: Hematology & Oncology

## 2013-08-14 ENCOUNTER — Other Ambulatory Visit (HOSPITAL_BASED_OUTPATIENT_CLINIC_OR_DEPARTMENT_OTHER): Payer: Medicare HMO

## 2013-08-14 ENCOUNTER — Ambulatory Visit (HOSPITAL_BASED_OUTPATIENT_CLINIC_OR_DEPARTMENT_OTHER): Payer: Medicare HMO

## 2013-08-14 VITALS — BP 168/77 | HR 71 | Temp 98.0°F | Resp 18

## 2013-08-14 VITALS — BP 164/75 | HR 78 | Temp 97.6°F | Resp 18 | Ht 61.0 in | Wt 175.0 lb

## 2013-08-14 DIAGNOSIS — R109 Unspecified abdominal pain: Secondary | ICD-10-CM

## 2013-08-14 DIAGNOSIS — D591 Autoimmune hemolytic anemia, unspecified: Secondary | ICD-10-CM

## 2013-08-14 DIAGNOSIS — C8307 Small cell B-cell lymphoma, spleen: Secondary | ICD-10-CM | POA: Insufficient documentation

## 2013-08-14 DIAGNOSIS — Z5112 Encounter for antineoplastic immunotherapy: Secondary | ICD-10-CM

## 2013-08-14 LAB — CBC WITH DIFFERENTIAL (CANCER CENTER ONLY)
BASO#: 0 10*3/uL (ref 0.0–0.2)
BASO%: 0.4 % (ref 0.0–2.0)
EOS%: 3.3 % (ref 0.0–7.0)
Eosinophils Absolute: 0.2 10*3/uL (ref 0.0–0.5)
HCT: 38.3 % (ref 34.8–46.6)
HGB: 12.6 g/dL (ref 11.6–15.9)
LYMPH#: 0.9 10*3/uL (ref 0.9–3.3)
LYMPH%: 19.4 % (ref 14.0–48.0)
MCH: 26.9 pg (ref 26.0–34.0)
MCHC: 32.9 g/dL (ref 32.0–36.0)
MCV: 82 fL (ref 81–101)
MONO#: 0.4 10*3/uL (ref 0.1–0.9)
MONO%: 9.7 % (ref 0.0–13.0)
NEUT#: 3 10*3/uL (ref 1.5–6.5)
NEUT%: 67.2 % (ref 39.6–80.0)
PLATELETS: 108 10*3/uL — AB (ref 145–400)
RBC: 4.69 10*6/uL (ref 3.70–5.32)
RDW: 14.5 % (ref 11.1–15.7)
WBC: 4.5 10*3/uL (ref 3.9–10.0)

## 2013-08-14 MED ORDER — ACETAMINOPHEN 325 MG PO TABS
650.0000 mg | ORAL_TABLET | Freq: Once | ORAL | Status: AC
  Administered 2013-08-14: 650 mg via ORAL

## 2013-08-14 MED ORDER — HEPARIN SOD (PORK) LOCK FLUSH 100 UNIT/ML IV SOLN
500.0000 [IU] | Freq: Once | INTRAVENOUS | Status: DC | PRN
  Filled 2013-08-14: qty 5

## 2013-08-14 MED ORDER — SODIUM CHLORIDE 0.9 % IV SOLN
Freq: Once | INTRAVENOUS | Status: AC
  Administered 2013-08-14: 09:00:00 via INTRAVENOUS

## 2013-08-14 MED ORDER — DIPHENHYDRAMINE HCL 25 MG PO CAPS
ORAL_CAPSULE | ORAL | Status: AC
  Filled 2013-08-14: qty 2

## 2013-08-14 MED ORDER — DIPHENHYDRAMINE HCL 25 MG PO CAPS
50.0000 mg | ORAL_CAPSULE | Freq: Once | ORAL | Status: AC
  Administered 2013-08-14: 50 mg via ORAL

## 2013-08-14 MED ORDER — SODIUM CHLORIDE 0.9 % IV SOLN
375.0000 mg/m2 | Freq: Once | INTRAVENOUS | Status: AC
  Administered 2013-08-14: 700 mg via INTRAVENOUS
  Filled 2013-08-14: qty 70

## 2013-08-14 MED ORDER — SODIUM CHLORIDE 0.9 % IJ SOLN
10.0000 mL | INTRAMUSCULAR | Status: DC | PRN
  Filled 2013-08-14: qty 10

## 2013-08-14 MED ORDER — ACETAMINOPHEN 325 MG PO TABS
ORAL_TABLET | ORAL | Status: AC
  Filled 2013-08-14: qty 2

## 2013-08-14 NOTE — Progress Notes (Signed)
Hematology and Oncology Follow Up Visit  Kristine Sullivan [id number] 27-Sep-1931 80 y.o. 08/14/2013   Principle Diagnosis:  Marginal zone lymphoma  Current Therapy:   maintenance Rituxan-every 3 month dosing      Interim History:  Ms.  Kristine Sullivan is for followup. She is a pain in the right quadrant of her abdomen. This be done for this about a month or so. There is no nausea with this. She's had no change in bowel or bladder habits. There is no hematuria. She's had no fever. That she no cough or shortness of breath. She's been very active. She still a caretaker for an elderly patient.  She's had no rashes. She's had no leg swelling. She's had no weight loss weight gain.     Medications: Current outpatient prescriptions:aspirin 81 MG tablet, Take 81 mg by mouth daily., Disp: , Rfl: ;  atorvastatin (LIPITOR) 80 MG tablet, Take 40 mg by mouth daily. , Disp: , Rfl: ;  Calcium Carb-Cholecalciferol (CALCIUM + D3) 600-200 MG-UNIT TABS, Take by mouth every morning., Disp: , Rfl: ;  furosemide (LASIX) 40 MG tablet, Take 40 mg by mouth daily., Disp: , Rfl:  metFORMIN (GLUCOPHAGE) 500 MG tablet, Take 500 mg by mouth daily with breakfast., Disp: , Rfl: ;  Olopatadine HCl (PATADAY) 0.2 % SOLN, Apply to eye every morning., Disp: , Rfl: ;  potassium chloride (K-DUR,KLOR-CON) 10 MEQ tablet, Take 10 mEq by mouth daily. , Disp: , Rfl: ;  promethazine (PHENERGAN) 12.5 MG tablet, Take 1 tablet (12.5 mg total) by mouth every 6 (six) hours as needed for nausea., Disp: 60 tablet, Rfl: 2 raloxifene (EVISTA) 60 MG tablet, Take 60 mg by mouth daily., Disp: , Rfl: ;  timolol (TIMOPTIC) 0.5 % ophthalmic solution, 1 drop 2 (two) times daily., Disp: , Rfl: ;  tiZANidine (ZANAFLEX) 4 MG capsule, Take 4 mg by mouth at bedtime., Disp: , Rfl:  No current facility-administered medications for this visit. Facility-Administered Medications Ordered in Other Visits: 0.9 %  sodium chloride infusion, 250 mL, Intravenous, Once, [name],  MD;  heparin lock flush 100 unit/mL, 500 Units, Intracatheter, Once PRN, [name], MD;  riTUXimab (RITUXAN) 700 mg in sodium chloride 0.9 % 250 mL chemo infusion, 375 mg/m2 (Treatment Plan Actual), Intravenous, Once, [name], MD sodium chloride 0.9 % injection 10 mL, 10 mL, Intracatheter, PRN, [name], MD  Allergies: No Known Allergies  Past Medical History, Surgical history, Social history, and Family History were reviewed and updated.  Review of Systems: As above  Physical Exam:  height is 5\' 1"  (1.549 m) and weight is 175 lb (79.379 kg). Her oral temperature is 97.6 F (36.4 C). Her blood pressure is 164/75 and her pulse is 78. Her respiration is 18.   Somewhat elderly African female. Her head and neck exam his doctor or oral lesions. There are no palpable cervical or supraclavicular this. Lungs are clear. Cardiac exam regular rate and rhythm with no murmurs rubs or bruits. Abdomen soft. She good bowel sounds. There is no fluid wave. There is no palpable liver or spleen tip. There is a slight fullness in the right upper quadrant. Extremities shows no clubbing cyanosis or edema. She has good range of motion of her joints. Skin exam no rashes. Neurological exam is nonfocal.  Lab Results  Component Value Date   WBC 4.5 08/14/2013   HGB 12.6 08/14/2013   HCT 38.3 08/14/2013   MCV 82 08/14/2013   PLT 108* 08/14/2013  Chemistry      Component Value Date/Time   NA 140 06/19/2013 0754   NA 131* 11/06/2012 2050   NA 139 05/28/2012 1036   K 3.3 06/19/2013 0754   K 3.0* 11/06/2012 2050   K 3.7 05/28/2012 1036   CL 100 06/19/2013 0754   CL 92* 11/06/2012 2050   CL 99 05/28/2012 1036   CO2 32 06/19/2013 0754   CO2 26 11/06/2012 2050   CO2 31* 05/28/2012 1036   BUN 13 06/19/2013 0754   BUN 10 11/06/2012 2050   BUN 12.3 05/28/2012 1036   CREATININE 0.9 06/19/2013 0754   CREATININE 0.70 11/06/2012 2050   CREATININE 0.7 05/28/2012 1036      Component Value Date/Time   CALCIUM 9.3  06/19/2013 0754   CALCIUM 8.7 11/06/2012 2050   CALCIUM 9.3 05/28/2012 1036   ALKPHOS 116* 05/09/2013 0909   ALKPHOS 103 11/06/2012 2050   AST 32 05/09/2013 0909   AST 47* 11/06/2012 2050   ALT 22 05/09/2013 0909   ALT 46* 11/06/2012 2050   BILITOT 0.90 05/09/2013 0909   BILITOT 1.5* 11/06/2012 2050         Impression and Plan: [name] is a-year-old African female. 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 [place] her with systemic chemotherapy. She got Rituxan-Cytoxan/vincristine/prednisone. She did well with this. She 8 cycles in February of 2014.  We will go and get ultrasound of her abdomen. She may be a risk for gallstones secondary to the past hemolytic anemia. I have noted that her platelet count is dropping. This we also to watch out for. I so that she may have hepatic steatosis. I think the ultrasound it shows his.  We will plan to get her back in 3 months. We will get back sooner if we find problems on the ultrasound.   [name], MD 6/24/20159:36 AM

## 2013-08-14 NOTE — Patient Instructions (Signed)
[facility] Discharge Instructions for Patients Receiving Chemotherapy  Today you received the following chemotherapy agents Rituxan.  To help prevent nausea and vomiting after your treatment, we encourage you to take your nausea medication as prescribed.   If you develop nausea and vomiting that is not controlled by your nausea medication, call the clinic.   BELOW ARE SYMPTOMS THAT SHOULD BE REPORTED IMMEDIATELY:  *FEVER GREATER THAN 100.5 F  *CHILLS WITH OR WITHOUT FEVER  NAUSEA AND VOMITING THAT IS NOT CONTROLLED WITH YOUR NAUSEA MEDICATION  *UNUSUAL SHORTNESS OF BREATH  *UNUSUAL BRUISING OR BLEEDING  TENDERNESS IN MOUTH AND THROAT WITH OR WITHOUT PRESENCE OF ULCERS  *URINARY PROBLEMS  *BOWEL PROBLEMS  UNUSUAL RASH Items with * indicate a potential emergency and should be followed up as soon as possible.  Feel free to call the clinic you have any questions or concerns. The clinic phone number is [phone].    

## 2013-08-14 NOTE — Telephone Encounter (Signed)
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

## 2013-08-15 ENCOUNTER — Telehealth: Payer: Self-pay | Admitting: *Deleted

## 2013-08-15 LAB — HAPTOGLOBIN: Haptoglobin: 144 mg/dL (ref 45–215)

## 2013-08-15 LAB — DIRECT ANTIGLOBULIN TEST (NOT AT ARMC)
DAT (COMPLEMENT): NEGATIVE
DAT IgG: NEGATIVE

## 2013-08-15 LAB — LACTATE DEHYDROGENASE: LDH: 252 U/L — AB (ref 94–250)

## 2013-08-15 NOTE — Telephone Encounter (Signed)
Message copied by Rico Ala on Thu Aug 15, 2013 11:00 AM ------      Message from: Burney Gauze R      Created: Wed Aug 14, 2013  6:04 PM       Call - no gallstones.  Maybe some fat in the liver.  No lymphoma.  pete ------

## 2013-08-15 NOTE — Telephone Encounter (Signed)
Called patient and left message that her ultrasound showed no gallstones, no lymphoma per dr Niel Hummer

## 2013-11-01 DIAGNOSIS — E669 Obesity, unspecified: Secondary | ICD-10-CM | POA: Insufficient documentation

## 2013-11-01 DIAGNOSIS — E876 Hypokalemia: Secondary | ICD-10-CM | POA: Insufficient documentation

## 2013-11-13 ENCOUNTER — Ambulatory Visit (HOSPITAL_BASED_OUTPATIENT_CLINIC_OR_DEPARTMENT_OTHER): Payer: Medicare HMO

## 2013-11-13 ENCOUNTER — Telehealth: Payer: Self-pay | Admitting: Hematology & Oncology

## 2013-11-13 ENCOUNTER — Other Ambulatory Visit (HOSPITAL_BASED_OUTPATIENT_CLINIC_OR_DEPARTMENT_OTHER): Payer: Medicare HMO | Admitting: Lab

## 2013-11-13 ENCOUNTER — Encounter: Payer: Self-pay | Admitting: Hematology & Oncology

## 2013-11-13 ENCOUNTER — Ambulatory Visit (HOSPITAL_BASED_OUTPATIENT_CLINIC_OR_DEPARTMENT_OTHER): Payer: Medicare HMO | Admitting: Hematology & Oncology

## 2013-11-13 ENCOUNTER — Ambulatory Visit (HOSPITAL_BASED_OUTPATIENT_CLINIC_OR_DEPARTMENT_OTHER)
Admission: RE | Admit: 2013-11-13 | Discharge: 2013-11-13 | Disposition: A | Discharge status: Home / Self Care | Payer: Medicare HMO | Source: Ambulatory Visit | Attending: Hematology & Oncology | Admitting: Hematology & Oncology

## 2013-11-13 VITALS — BP 183/78 | HR 76 | Temp 98.0°F | Resp 16 | Ht 61.0 in | Wt 175.0 lb

## 2013-11-13 VITALS — BP 160/66 | HR 66 | Temp 97.9°F | Resp 18

## 2013-11-13 DIAGNOSIS — R05 Cough: Secondary | ICD-10-CM | POA: Insufficient documentation

## 2013-11-13 DIAGNOSIS — R0602 Shortness of breath: Secondary | ICD-10-CM | POA: Diagnosis not present

## 2013-11-13 DIAGNOSIS — Z5112 Encounter for antineoplastic immunotherapy: Secondary | ICD-10-CM

## 2013-11-13 DIAGNOSIS — R059 Cough, unspecified: Secondary | ICD-10-CM

## 2013-11-13 DIAGNOSIS — J9801 Acute bronchospasm: Secondary | ICD-10-CM | POA: Diagnosis not present

## 2013-11-13 DIAGNOSIS — C8307 Small cell B-cell lymphoma, spleen: Secondary | ICD-10-CM

## 2013-11-13 DIAGNOSIS — R062 Wheezing: Secondary | ICD-10-CM | POA: Insufficient documentation

## 2013-11-13 LAB — CMP (CANCER CENTER ONLY)
ALK PHOS: 138 U/L — AB (ref 26–84)
ALT(SGPT): 22 U/L (ref 10–47)
AST: 25 U/L (ref 11–38)
Albumin: 3.9 g/dL (ref 3.3–5.5)
BUN: 12 mg/dL (ref 7–22)
CALCIUM: 9 mg/dL (ref 8.0–10.3)
CO2: 27 meq/L (ref 18–33)
Chloride: 103 mEq/L (ref 98–108)
Creat: 0.6 mg/dl (ref 0.6–1.2)
Glucose, Bld: 116 mg/dL (ref 73–118)
POTASSIUM: 4.2 meq/L (ref 3.3–4.7)
Sodium: 147 mEq/L — ABNORMAL HIGH (ref 128–145)
TOTAL PROTEIN: 6.4 g/dL (ref 6.4–8.1)
Total Bilirubin: 0.7 mg/dl (ref 0.20–1.60)

## 2013-11-13 LAB — CBC WITH DIFFERENTIAL (CANCER CENTER ONLY)
BASO#: 0 10*3/uL (ref 0.0–0.2)
BASO%: 0.2 % (ref 0.0–2.0)
EOS%: 2.6 % (ref 0.0–7.0)
Eosinophils Absolute: 0.1 10*3/uL (ref 0.0–0.5)
HCT: 39.7 % (ref 34.8–46.6)
HGB: 12.9 g/dL (ref 11.6–15.9)
LYMPH#: 0.8 10*3/uL — AB (ref 0.9–3.3)
LYMPH%: 14 % (ref 14.0–48.0)
MCH: 26.3 pg (ref 26.0–34.0)
MCHC: 32.5 g/dL (ref 32.0–36.0)
MCV: 81 fL (ref 81–101)
MONO#: 0.5 10*3/uL (ref 0.1–0.9)
MONO%: 9.7 % (ref 0.0–13.0)
NEUT#: 3.9 10*3/uL (ref 1.5–6.5)
NEUT%: 73.5 % (ref 39.6–80.0)
PLATELETS: 127 10*3/uL — AB (ref 145–400)
RBC: 4.9 10*6/uL (ref 3.70–5.32)
RDW: 14.3 % (ref 11.1–15.7)
WBC: 5.4 10*3/uL (ref 3.9–10.0)

## 2013-11-13 LAB — CHCC SATELLITE - SMEAR

## 2013-11-13 MED ORDER — IPRATROPIUM BROMIDE 0.02 % IN SOLN
0.5000 mg | Freq: Once | RESPIRATORY_TRACT | Status: AC
  Administered 2013-11-13: 0.5 mg via RESPIRATORY_TRACT
  Filled 2013-11-13: qty 2.5

## 2013-11-13 MED ORDER — SODIUM CHLORIDE 0.9 % IV SOLN
Freq: Once | INTRAVENOUS | Status: AC
  Administered 2013-11-13: 10:00:00 via INTRAVENOUS

## 2013-11-13 MED ORDER — DIPHENHYDRAMINE HCL 25 MG PO CAPS
50.0000 mg | ORAL_CAPSULE | Freq: Once | ORAL | Status: AC
  Administered 2013-11-13: 50 mg via ORAL

## 2013-11-13 MED ORDER — SODIUM CHLORIDE 0.9 % IJ SOLN
10.0000 mL | INTRAMUSCULAR | Status: DC | PRN
  Administered 2013-11-13: 10 mL
  Filled 2013-11-13: qty 10

## 2013-11-13 MED ORDER — ALBUTEROL SULFATE (5 MG/ML) 0.5% IN NEBU
2.5000 mg | INHALATION_SOLUTION | Freq: Once | RESPIRATORY_TRACT | Status: AC
  Administered 2013-11-13: 2.5 mg via RESPIRATORY_TRACT
  Filled 2013-11-13: qty 0.5

## 2013-11-13 MED ORDER — ACETAMINOPHEN 325 MG PO TABS
650.0000 mg | ORAL_TABLET | Freq: Once | ORAL | Status: AC
  Administered 2013-11-13: 650 mg via ORAL

## 2013-11-13 MED ORDER — IPRATROPIUM BROMIDE 0.02 % IN SOLN
RESPIRATORY_TRACT | Status: AC
  Filled 2013-11-13: qty 2.5

## 2013-11-13 MED ORDER — ACETAMINOPHEN 325 MG PO TABS
ORAL_TABLET | ORAL | Status: AC
  Filled 2013-11-13: qty 2

## 2013-11-13 MED ORDER — RITUXIMAB CHEMO INJECTION 10 MG/ML
375.0000 mg/m2 | Freq: Once | INTRAVENOUS | Status: AC
  Administered 2013-11-13: 700 mg via INTRAVENOUS
  Filled 2013-11-13: qty 70

## 2013-11-13 MED ORDER — HEPARIN SOD (PORK) LOCK FLUSH 100 UNIT/ML IV SOLN
500.0000 [IU] | Freq: Once | INTRAVENOUS | Status: AC | PRN
  Administered 2013-11-13: 500 [IU]
  Filled 2013-11-13: qty 5

## 2013-11-13 MED ORDER — DIPHENHYDRAMINE HCL 25 MG PO CAPS
ORAL_CAPSULE | ORAL | Status: AC
  Filled 2013-11-13: qty 2

## 2013-11-13 NOTE — Patient Instructions (Signed)

## 2013-11-13 NOTE — Telephone Encounter (Signed)
[place]: [id number] Status: Approved  Dates: 11/13/2013 - 02/11/2014  CPT: K9381; W2993

## 2013-11-13 NOTE — Progress Notes (Signed)
Hematology and Oncology Follow Up Visit  Kristine Sullivan [id number] [birth year] 80 y.o. 11/13/2013   Principle Diagnosis:  Marginal zone lymphoma  Current Therapy:   Maintenance Rituxan-every 3 month dosing      Interim History:  Kristine Sullivan is back for followup. She has a cough. She's had this for a few days. She says that is productive. She thinks it might be allergies.  We did go ahead and get a chest x-ray on her. Chest x-ray did not show any infiltrate. There is no evidence of congestive heart failure.  Otherwise, she feels well. Her blood sugars have been doing pretty well. She's had no problems with fever. She's had no bleeding. He's been no rashes. No swollen glands.  She's doing well with the Rituxan. I started Rituxan back in June of 2014.  Medications: Current outpatient prescriptions:aspirin 81 MG tablet, Take 81 mg by mouth daily., Disp: , Rfl: ;  atorvastatin (LIPITOR) 80 MG tablet, Take 40 mg by mouth daily. , Disp: , Rfl: ;  Calcium Carb-Cholecalciferol (CALCIUM + D3) 600-200 MG-UNIT TABS, Take by mouth every morning., Disp: , Rfl: ;  furosemide (LASIX) 40 MG tablet, Take 40 mg by mouth daily., Disp: , Rfl:  metFORMIN (GLUCOPHAGE) 500 MG tablet, Take 500 mg by mouth daily with breakfast., Disp: , Rfl: ;  Olopatadine HCl (PATADAY) 0.2 % SOLN, Apply to eye every morning., Disp: , Rfl: ;  potassium chloride (K-DUR,KLOR-CON) 10 MEQ tablet, Take 10 mEq by mouth daily. , Disp: , Rfl: ;  promethazine (PHENERGAN) 12.5 MG tablet, Take 1 tablet (12.5 mg total) by mouth every 6 (six) hours as needed for nausea., Disp: 60 tablet, Rfl: 2 raloxifene (EVISTA) 60 MG tablet, Take 60 mg by mouth daily., Disp: , Rfl: ;  timolol (TIMOPTIC) 0.5 % ophthalmic solution, 1 drop 2 (two) times daily., Disp: , Rfl: ;  tiZANidine (ZANAFLEX) 4 MG capsule, Take 4 mg by mouth at bedtime., Disp: , Rfl:  No current facility-administered medications for this visit. Facility-Administered Medications Ordered in  Other Visits: 0.9 %  sodium chloride infusion, 250 mL, Intravenous, Once, [name], MD;  sodium chloride 0.9 % injection 10 mL, 10 mL, Intracatheter, PRN, [name], MD, 10 mL at 11/13/13 1320  Allergies: No Known Allergies  Past Medical History, Surgical history, Social history, and Family History were reviewed and updated.  Review of Systems: As above  Physical Exam:  height is 5\' 1"  (1.549 m) and weight is 175 lb (79.379 kg). Her oral temperature is 98 F (36.7 C). Her blood pressure is 183/78 and her pulse is 76. Her respiration is 16.   Well-developed well-nourished [place] American female. Lungs are with scattered wheezes bilaterally. She has some crackles bilaterally. She has decent air movement. Cardiac exam regular in rhythm. There are no murmurs, rubs or bruits. Head exam shows no adenopathy in the neck. She has no oral lesions. Abdomen is obese good bowel sounds there is no fluid wave is no palpable liver or spleen tip. Extremities shows no clubbing, cyanosis or edema. The right foot of her joints. Neurological exam shows no focal neurological deficits.  Lab Results  Component Value Date   WBC 5.4 11/13/2013   HGB 12.9 11/13/2013   HCT 39.7 11/13/2013   MCV 81 11/13/2013   PLT 127* 11/13/2013     Chemistry      Component Value Date/Time   NA 147* 11/13/2013 0750   NA 131* 11/06/2012 2050   NA 139 05/28/2012 1036  K 4.2 11/13/2013 0750   K 3.0* 11/06/2012 2050   K 3.7 05/28/2012 1036   CL 103 11/13/2013 0750   CL 92* 11/06/2012 2050   CL 99 05/28/2012 1036   CO2 27 11/13/2013 0750   CO2 26 11/06/2012 2050   CO2 31* 05/28/2012 1036   BUN 12 11/13/2013 0750   BUN 10 11/06/2012 2050   BUN 12.3 05/28/2012 1036   CREATININE 0.6 11/13/2013 0750   CREATININE 0.70 11/06/2012 2050   CREATININE 0.7 05/28/2012 1036      Component Value Date/Time   CALCIUM 9.0 11/13/2013 0750   CALCIUM 8.7 11/06/2012 2050   CALCIUM 9.3 05/28/2012 1036   ALKPHOS 138* 11/13/2013 0750   ALKPHOS 103 11/06/2012  2050   AST 25 11/13/2013 0750   AST 47* 11/06/2012 2050   ALT 22 11/13/2013 0750   ALT 46* 11/06/2012 2050   BILITOT 0.70 11/13/2013 0750   BILITOT 1.5* 11/06/2012 2050         Impression and Plan: Kristine Sullivan is a-year-old Afro-American female. She has a history of marginal zone lymphoma. She is treated with systemic chemotherapy for this. She got into remission. She has remained in remission. We are giving her 2 years of  Rituxan. I think she'll finish up in June of next year.  We will go and give her a nebulizer treatment today. They're I don't think she needs any kind of antibiotic. Pressure will go back to her family doctor if she has any additional problems.   [name], MD 9/23/20152:15 PM

## 2013-11-14 ENCOUNTER — Telehealth: Payer: Self-pay | Admitting: Nurse Practitioner

## 2013-11-14 ENCOUNTER — Telehealth: Payer: Self-pay | Admitting: Hematology & Oncology

## 2013-11-14 LAB — RETICULOCYTES (CHCC)
ABS Retic: 59.2 10*3/uL (ref 19.0–186.0)
RBC.: 4.93 MIL/uL (ref 3.87–5.11)
Retic Ct Pct: 1.2 % (ref 0.4–2.3)

## 2013-11-14 LAB — LACTATE DEHYDROGENASE: LDH: 276 U/L — AB (ref 94–250)

## 2013-11-14 LAB — VITAMIN D 25 HYDROXY (VIT D DEFICIENCY, FRACTURES): Vit D, 25-Hydroxy: 19 ng/mL — ABNORMAL LOW (ref 30–89)

## 2013-11-14 NOTE — Telephone Encounter (Signed)
Mailed dec schedule

## 2013-11-14 NOTE — Telephone Encounter (Signed)
Message copied by Jimmy Footman on Thu Nov 14, 2013 10:39 AM ------      Message from: Burney Gauze R      Created: Thu Nov 14, 2013  7:46 AM       Call - Vit D is very low!!  Make sure she is taking 2000 units a day!!  pete ------

## 2013-11-15 ENCOUNTER — Other Ambulatory Visit: Payer: Self-pay | Admitting: Nurse Practitioner

## 2013-11-15 ENCOUNTER — Telehealth: Payer: Self-pay | Admitting: Nurse Practitioner

## 2013-11-15 MED ORDER — ERGOCALCIFEROL 1.25 MG (50000 UT) PO CAPS: 50000.0000 [IU] | ORAL_CAPSULE | ORAL | Status: DC

## 2013-11-15 NOTE — Telephone Encounter (Addendum)
Message copied by Jimmy Footman on Fri Nov 15, 2013 11:36 AM ------      Message from: Burney Gauze R      Created: Thu Nov 14, 2013  7:46 AM       Call - Vit D is very low!!  Make sure she is taking 2000 units a day!!  pete ------Pt is currently taking 2000IU daily, per Dr. Marin Olp pt is now instructed to take 50,000 units/week. She is aware and verbalized understanding and appreciation.

## 2014-02-05 ENCOUNTER — Ambulatory Visit (HOSPITAL_BASED_OUTPATIENT_CLINIC_OR_DEPARTMENT_OTHER): Payer: Medicare HMO | Admitting: Family

## 2014-02-05 ENCOUNTER — Encounter: Payer: Self-pay | Admitting: Family

## 2014-02-05 ENCOUNTER — Ambulatory Visit (HOSPITAL_BASED_OUTPATIENT_CLINIC_OR_DEPARTMENT_OTHER): Payer: Medicare HMO

## 2014-02-05 ENCOUNTER — Other Ambulatory Visit (HOSPITAL_BASED_OUTPATIENT_CLINIC_OR_DEPARTMENT_OTHER): Payer: Medicare HMO | Admitting: Lab

## 2014-02-05 DIAGNOSIS — G629 Polyneuropathy, unspecified: Secondary | ICD-10-CM

## 2014-02-05 DIAGNOSIS — C8587 Other specified types of non-Hodgkin lymphoma, spleen: Secondary | ICD-10-CM

## 2014-02-05 DIAGNOSIS — C8307 Small cell B-cell lymphoma, spleen: Secondary | ICD-10-CM

## 2014-02-05 DIAGNOSIS — Z5112 Encounter for antineoplastic immunotherapy: Secondary | ICD-10-CM

## 2014-02-05 DIAGNOSIS — J9801 Acute bronchospasm: Secondary | ICD-10-CM

## 2014-02-05 LAB — CMP (CANCER CENTER ONLY)
ALBUMIN: 3.3 g/dL (ref 3.3–5.5)
ALK PHOS: 121 U/L — AB (ref 26–84)
ALT(SGPT): 21 U/L (ref 10–47)
AST: 21 U/L (ref 11–38)
BUN: 14 mg/dL (ref 7–22)
CO2: 26 mEq/L (ref 18–33)
CREATININE: 0.6 mg/dL (ref 0.6–1.2)
Calcium: 8.2 mg/dL (ref 8.0–10.3)
Chloride: 103 mEq/L (ref 98–108)
Glucose, Bld: 155 mg/dL — ABNORMAL HIGH (ref 73–118)
POTASSIUM: 3.9 meq/L (ref 3.3–4.7)
Sodium: 140 mEq/L (ref 128–145)
Total Bilirubin: 0.6 mg/dl (ref 0.20–1.60)
Total Protein: 6.1 g/dL — ABNORMAL LOW (ref 6.4–8.1)

## 2014-02-05 LAB — CBC WITH DIFFERENTIAL (CANCER CENTER ONLY)
BASO#: 0 10*3/uL (ref 0.0–0.2)
BASO%: 0.3 % (ref 0.0–2.0)
EOS%: 3 % (ref 0.0–7.0)
Eosinophils Absolute: 0.2 10*3/uL (ref 0.0–0.5)
HEMATOCRIT: 38 % (ref 34.8–46.6)
HEMOGLOBIN: 12.2 g/dL (ref 11.6–15.9)
LYMPH#: 0.9 10*3/uL (ref 0.9–3.3)
LYMPH%: 14.1 % (ref 14.0–48.0)
MCH: 26.2 pg (ref 26.0–34.0)
MCHC: 32.1 g/dL (ref 32.0–36.0)
MCV: 82 fL (ref 81–101)
MONO#: 0.5 10*3/uL (ref 0.1–0.9)
MONO%: 7.8 % (ref 0.0–13.0)
NEUT#: 4.8 10*3/uL (ref 1.5–6.5)
NEUT%: 74.8 % (ref 39.6–80.0)
Platelets: 135 10*3/uL — ABNORMAL LOW (ref 145–400)
RBC: 4.66 10*6/uL (ref 3.70–5.32)
RDW: 14.4 % (ref 11.1–15.7)
WBC: 6.4 10*3/uL (ref 3.9–10.0)

## 2014-02-05 LAB — LACTATE DEHYDROGENASE: LDH: 213 U/L (ref 94–250)

## 2014-02-05 MED ORDER — HEPARIN SOD (PORK) LOCK FLUSH 100 UNIT/ML IV SOLN
500.0000 [IU] | Freq: Once | INTRAVENOUS | Status: AC | PRN
  Administered 2014-02-05: 500 [IU]
  Filled 2014-02-05: qty 5

## 2014-02-05 MED ORDER — VITAMIN B6 250 MG PO TABS: 250.0000 mg | ORAL_TABLET | Freq: Every day | ORAL | Status: DC

## 2014-02-05 MED ORDER — DIPHENHYDRAMINE HCL 25 MG PO CAPS
ORAL_CAPSULE | ORAL | Status: AC
  Filled 2014-02-05: qty 2

## 2014-02-05 MED ORDER — DIPHENHYDRAMINE HCL 25 MG PO CAPS
50.0000 mg | ORAL_CAPSULE | Freq: Once | ORAL | Status: AC
  Administered 2014-02-05: 50 mg via ORAL

## 2014-02-05 MED ORDER — ACETAMINOPHEN 325 MG PO TABS
ORAL_TABLET | ORAL | Status: AC
  Filled 2014-02-05: qty 2

## 2014-02-05 MED ORDER — SODIUM CHLORIDE 0.9 % IV SOLN
375.0000 mg/m2 | Freq: Once | INTRAVENOUS | Status: AC
  Administered 2014-02-05: 700 mg via INTRAVENOUS
  Filled 2014-02-05: qty 70

## 2014-02-05 MED ORDER — ACETAMINOPHEN 325 MG PO TABS
650.0000 mg | ORAL_TABLET | Freq: Once | ORAL | Status: AC
  Administered 2014-02-05: 650 mg via ORAL

## 2014-02-05 MED ORDER — SODIUM CHLORIDE 0.9 % IV SOLN
Freq: Once | INTRAVENOUS | Status: AC
  Administered 2014-02-05: 10:00:00 via INTRAVENOUS

## 2014-02-05 MED ORDER — SODIUM CHLORIDE 0.9 % IJ SOLN
10.0000 mL | INTRAMUSCULAR | Status: DC | PRN
  Administered 2014-02-05: 10 mL
  Filled 2014-02-05: qty 10

## 2014-02-05 NOTE — Progress Notes (Signed)
[facility]  [phone] [phone]  ID: Kristine Sullivan OB: [birth year] MR#: [id number] STM#:[id number] Patient Care Team: Reeves Dam, MD as PCP - General (Internal Medicine)  DIAGNOSIS: Marginal zone lymphoma  INTERVAL HISTORY: Kristine Sullivan is here today for follow-up. She is feeling good and getting ready to spend Christmas with her family.  Her allergies are better and her cough is gone. She denies fatigue, fever, chills, n/v, cough, rash, headache, dizziness, SOB, chest pain, palpitations, abdominal pain, constipation, diarrhea, blood in urine or stool.  No swelling or tenderness in her extremities. She does have some numbness and tingling in her feet and lower legs. She does not check her blood sugars often and when she does she states that they do run high. She is going to cut back on her bread and sugar intake.  Her appetite is good and she is staying hydrated.   CURRENT TREATMENT: Maintenance Rituxan-every 3 month dosing  REVIEW OF SYSTEMS: All other 10 point review of systems is negative.   PAST MEDICAL HISTORY: Past Medical History  Diagnosis Date  . Anorexia   . Pneumonia, organism unspecified   . Other dyspnea and respiratory abnormality   . Thyrotoxicosis without mention of goiter or other cause, without mention of thyrotoxic crisis or storm   . Hypoxemia   . Intestinal infection due to other organism, not elsewhere classified   . 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 Hypertension   . Diabetes mellitus without complication     PAST SURGICAL HISTORY: Past Surgical History  Procedure Laterality Date  . Cataract extraction      FAMILY HISTORY History reviewed. No pertinent family history.  GYNECOLOGIC HISTORY:  No LMP recorded. Patient is postmenopausal.   SOCIAL HISTORY: History   Social History  . Marital Status: Widowed    Spouse Name: N/A    Number of Children: N/A  . Years of Education: N/A   Occupational  History  . Not on file.   Social History Main Topics  . Smoking status: Never Smoker   . Smokeless tobacco: Never Used     Comment: never used tobacco  . Alcohol Use: Not on file  . Drug Use: Not on file  . Sexual Activity: Not on file   Other Topics Concern  . Not on file   Social History Narrative    ADVANCED DIRECTIVES:  <no information>  HEALTH MAINTENANCE: History  Substance Use Topics  . Smoking status: Never Smoker   . Smokeless tobacco: Never Used     Comment: never used tobacco  . Alcohol Use: Not on file   Colonoscopy: PAP: Bone density: Lipid panel:  No Known Allergies  Current Outpatient Prescriptions  Medication Sig Dispense Refill  . aspirin 81 MG tablet Take 81 mg by mouth daily.    [name] atorvastatin (LIPITOR) 80 MG tablet Take 40 mg by mouth daily.     . Calcium Carb-Cholecalciferol (CALCIUM + D3) 600-200 MG-UNIT TABS Take by mouth every morning.    . ergocalciferol (VITAMIN D2) 50000 UNITS capsule Take 1 capsule (50,000 Units total) by mouth once a week. 4 capsule 12  . furosemide (LASIX) 40 MG tablet Take 40 mg by mouth daily.    . metFORMIN (GLUCOPHAGE) 500 MG tablet Take 500 mg by mouth daily with breakfast.    . Olopatadine HCl (PATADAY) 0.2 % SOLN Apply to eye every morning.    . potassium chloride (K-DUR,KLOR-CON) 10 MEQ tablet Take 10 mEq by mouth daily.     [name]  promethazine (PHENERGAN) 12.5 MG tablet Take 1 tablet (12.5 mg total) by mouth every 6 (six) hours as needed for nausea. 60 tablet 2  . Pyridoxine HCl (VITAMIN B6) 250 MG TABS Take 1 tablet (250 mg total) by mouth daily. 30 tablet 11  . raloxifene (EVISTA) 60 MG tablet Take 60 mg by mouth daily.    . timolol (TIMOPTIC) 0.5 % ophthalmic solution 1 drop 2 (two) times daily.    [name] tiZANidine (ZANAFLEX) 4 MG capsule Take 4 mg by mouth at bedtime.     No current facility-administered medications for this visit.   Facility-Administered Medications Ordered in Other Visits  Medication Dose Route  Frequency Provider Last Rate Last Dose  . 0.9 %  sodium chloride infusion  250 mL Intravenous Once [name], MD   Stopped at 10/11/11 1835    OBJECTIVE: Filed Vitals:   02/05/14 0900  BP: 189/72  Pulse: 68  Temp: 97.4 F (36.3 C)  Resp: 18    Filed Weights   02/05/14 0900  Weight: 180 lb 6.4 oz (81.829 kg)   ECOG FS:0 - Asymptomatic Ocular: Sclerae unicteric, pupils equal, round and reactive to light Ear-nose-throat: Oropharynx clear, dentition fair Lymphatic: No cervical or supraclavicular adenopathy Lungs no rales or rhonchi, good excursion bilaterally Heart regular rate and rhythm, no murmur appreciated Abd soft, nontender, positive bowel sounds MSK no focal spinal tenderness, no joint edema Neuro: non-focal, well-oriented, appropriate affect Breasts: Deferred  LAB RESULTS: CMP     Component Value Date/Time   NA 140 02/05/2014 0853   NA 131* 11/06/2012 2050   NA 139 05/28/2012 1036   K 3.9 02/05/2014 0853   K 3.0* 11/06/2012 2050   K 3.7 05/28/2012 1036   CL 103 02/05/2014 0853   CL 92* 11/06/2012 2050   CL 99 05/28/2012 1036   CO2 26 02/05/2014 0853   CO2 26 11/06/2012 2050   CO2 31* 05/28/2012 1036   GLUCOSE 155* 02/05/2014 0853   GLUCOSE 156* 11/06/2012 2050   GLUCOSE 120* 05/28/2012 1036   BUN 14 02/05/2014 0853   BUN 10 11/06/2012 2050   BUN 12.3 05/28/2012 1036   CREATININE 0.6 02/05/2014 0853   CREATININE 0.70 11/06/2012 2050   CREATININE 0.7 05/28/2012 1036   CALCIUM 8.2 02/05/2014 0853   CALCIUM 8.7 11/06/2012 2050   CALCIUM 9.3 05/28/2012 1036   PROT 6.1* 02/05/2014 0853   PROT 6.4 11/06/2012 2050   ALBUMIN 3.0* 11/06/2012 2050   AST 21 02/05/2014 0853   AST 47* 11/06/2012 2050   ALT 21 02/05/2014 0853   ALT 46* 11/06/2012 2050   ALKPHOS 121* 02/05/2014 0853   ALKPHOS 103 11/06/2012 2050   BILITOT 0.60 02/05/2014 0853   BILITOT 1.5* 11/06/2012 2050   GFRNONAA 80* 11/06/2012 2050   GFRAA >90 11/06/2012 2050   INo results found  for: SPEP, UPEP Lab Results  Component Value Date   WBC 6.4 02/05/2014   NEUTROABS 4.8 02/05/2014   HGB 12.2 02/05/2014   HCT 38.0 02/05/2014   MCV 82 02/05/2014   PLT 135* 02/05/2014   No results found for: LABCA2 No components found for: [id number] No results for input(s): INR in the last 168 hours.  STUDIES: No results found.  ASSESSMENT/PLAN: Kristine Sullivan is an 80 year old African American female with history of marginal zone lymphoma. She was treated with systemic chemotherapy for this and went into remission. She has remained in remission. We are giving her 2 years ofRituxan which she will complete in June  of 2016. She will get Rituxan today as planned.  Her CBC today was good. We will see what the rest of her labs show.  I sent a prescription for Vitamin B6 250mg  daily to her Pharmacy. Hopefully this will help with her neuropathy.  We will see her back in 3 months for labs, follow-up and infusion.  She knows to call here with any questions or concerns and to go to the ED in the event of an emergency. We can certainly see her sooner if need be.   Eliezer Bottom, NP 02/05/2014 9:40 AM

## 2014-02-05 NOTE — Patient Instructions (Signed)

## 2014-04-01 ENCOUNTER — Other Ambulatory Visit: Payer: Self-pay | Admitting: Family

## 2014-05-06 ENCOUNTER — Telehealth: Payer: Self-pay | Admitting: Hematology & Oncology

## 2014-05-06 NOTE — Telephone Encounter (Signed)
[place] Nbr: [id number] Status: Approved  Dates: 05/07/2014 - 08/05/2014 CPT: M6219; I7125

## 2014-05-07 ENCOUNTER — Other Ambulatory Visit (HOSPITAL_BASED_OUTPATIENT_CLINIC_OR_DEPARTMENT_OTHER): Payer: Medicare HMO | Admitting: Lab

## 2014-05-07 ENCOUNTER — Ambulatory Visit (HOSPITAL_BASED_OUTPATIENT_CLINIC_OR_DEPARTMENT_OTHER): Payer: Medicare HMO | Admitting: Hematology & Oncology

## 2014-05-07 ENCOUNTER — Encounter: Payer: Self-pay | Admitting: Hematology & Oncology

## 2014-05-07 ENCOUNTER — Ambulatory Visit (HOSPITAL_BASED_OUTPATIENT_CLINIC_OR_DEPARTMENT_OTHER): Payer: Medicare HMO

## 2014-05-07 VITALS — BP 151/86 | HR 69 | Temp 97.6°F | Resp 14 | Ht 61.0 in | Wt 178.0 lb

## 2014-05-07 DIAGNOSIS — C8587 Other specified types of non-Hodgkin lymphoma, spleen: Secondary | ICD-10-CM

## 2014-05-07 DIAGNOSIS — C8307 Small cell B-cell lymphoma, spleen: Secondary | ICD-10-CM

## 2014-05-07 DIAGNOSIS — Z5112 Encounter for antineoplastic immunotherapy: Secondary | ICD-10-CM | POA: Diagnosis not present

## 2014-05-07 DIAGNOSIS — E119 Type 2 diabetes mellitus without complications: Secondary | ICD-10-CM

## 2014-05-07 LAB — CBC WITH DIFFERENTIAL (CANCER CENTER ONLY)
BASO#: 0 10*3/uL (ref 0.0–0.2)
BASO%: 0.4 % (ref 0.0–2.0)
EOS%: 3.3 % (ref 0.0–7.0)
Eosinophils Absolute: 0.2 10*3/uL (ref 0.0–0.5)
HEMATOCRIT: 40.4 % (ref 34.8–46.6)
HGB: 13 g/dL (ref 11.6–15.9)
LYMPH#: 0.7 10*3/uL — ABNORMAL LOW (ref 0.9–3.3)
LYMPH%: 15.5 % (ref 14.0–48.0)
MCH: 25.9 pg — ABNORMAL LOW (ref 26.0–34.0)
MCHC: 32.2 g/dL (ref 32.0–36.0)
MCV: 81 fL (ref 81–101)
MONO#: 0.5 10*3/uL (ref 0.1–0.9)
MONO%: 9.4 % (ref 0.0–13.0)
NEUT#: 3.4 10*3/uL (ref 1.5–6.5)
NEUT%: 71.4 % (ref 39.6–80.0)
PLATELETS: 130 10*3/uL — AB (ref 145–400)
RBC: 5.02 10*6/uL (ref 3.70–5.32)
RDW: 15.1 % (ref 11.1–15.7)
WBC: 4.8 10*3/uL (ref 3.9–10.0)

## 2014-05-07 LAB — CMP (CANCER CENTER ONLY)
ALK PHOS: 155 U/L — AB (ref 26–84)
ALT(SGPT): 19 U/L (ref 10–47)
AST: 27 U/L (ref 11–38)
Albumin: 3.9 g/dL (ref 3.3–5.5)
BILIRUBIN TOTAL: 0.8 mg/dL (ref 0.20–1.60)
BUN, Bld: 12 mg/dL (ref 7–22)
CO2: 30 meq/L (ref 18–33)
CREATININE: 0.8 mg/dL (ref 0.6–1.2)
Calcium: 8.9 mg/dL (ref 8.0–10.3)
Chloride: 103 mEq/L (ref 98–108)
Glucose, Bld: 127 mg/dL — ABNORMAL HIGH (ref 73–118)
POTASSIUM: 3.6 meq/L (ref 3.3–4.7)
Sodium: 140 mEq/L (ref 128–145)
Total Protein: 6.5 g/dL (ref 6.4–8.1)

## 2014-05-07 LAB — LACTATE DEHYDROGENASE: LDH: 229 U/L (ref 94–250)

## 2014-05-07 MED ORDER — HEPARIN SOD (PORK) LOCK FLUSH 100 UNIT/ML IV SOLN
500.0000 [IU] | Freq: Once | INTRAVENOUS | Status: AC | PRN
  Administered 2014-05-07: 500 [IU]
  Filled 2014-05-07: qty 5

## 2014-05-07 MED ORDER — SODIUM CHLORIDE 0.9 % IV SOLN
Freq: Once | INTRAVENOUS | Status: AC
  Administered 2014-05-07: 12:00:00 via INTRAVENOUS

## 2014-05-07 MED ORDER — ACETAMINOPHEN 325 MG PO TABS
650.0000 mg | ORAL_TABLET | Freq: Once | ORAL | Status: AC
  Administered 2014-05-07: 650 mg via ORAL

## 2014-05-07 MED ORDER — ACETAMINOPHEN 325 MG PO TABS
ORAL_TABLET | ORAL | Status: AC
  Filled 2014-05-07: qty 2

## 2014-05-07 MED ORDER — SODIUM CHLORIDE 0.9 % IJ SOLN
10.0000 mL | INTRAMUSCULAR | Status: DC | PRN
  Administered 2014-05-07: 10 mL
  Filled 2014-05-07: qty 10

## 2014-05-07 MED ORDER — DIPHENHYDRAMINE HCL 25 MG PO CAPS
ORAL_CAPSULE | ORAL | Status: AC
  Filled 2014-05-07: qty 2

## 2014-05-07 MED ORDER — SODIUM CHLORIDE 0.9 % IV SOLN
375.0000 mg/m2 | Freq: Once | INTRAVENOUS | Status: AC
  Administered 2014-05-07: 700 mg via INTRAVENOUS
  Filled 2014-05-07: qty 70

## 2014-05-07 MED ORDER — DIPHENHYDRAMINE HCL 25 MG PO CAPS
50.0000 mg | ORAL_CAPSULE | Freq: Once | ORAL | Status: AC
  Administered 2014-05-07: 50 mg via ORAL

## 2014-05-07 NOTE — Progress Notes (Signed)
Hematology and Oncology Follow Up Visit  Kristine Sullivan [id number] [birth year] 81 y.o. 05/07/2014   Principle Diagnosis:  Marginal zone lymphoma  Current Therapy:   Maintenance Rituxan-every 3 month dosing      Interim History:  Ms.  Kristine Sullivan is back for followup. She is doing quite well. She is looking for to Easter. Per she's had no proms over the winter. She did not have any lung issues. There were no asthma attacks. Patch she's gained some weight. She try to lose weight. She is diabetic.  She's had no issues with nausea or vomiting. There's been no change in bowel or bladder habits. Patient had no rashes. There's been no leg swelling.  She's doing well with the Rituxan. I started Rituxan back in June of 2014.  Medications:  Current outpatient prescriptions:  .  aspirin 81 MG tablet, Take 81 mg by mouth daily., Disp: , Rfl:  .  atorvastatin (LIPITOR) 80 MG tablet, Take 40 mg by mouth daily. , Disp: , Rfl:  .  Calcium Carb-Cholecalciferol (CALCIUM + D3) 600-200 MG-UNIT TABS, Take by mouth every morning., Disp: , Rfl:  .  ergocalciferol (VITAMIN D2) 50000 UNITS capsule, Take 1 capsule (50,000 Units total) by mouth once a week., Disp: 4 capsule, Rfl: 12 .  furosemide (LASIX) 40 MG tablet, Take 40 mg by mouth daily., Disp: , Rfl:  .  loratadine (CLARITIN) 10 MG tablet, OTC Take by mouth one a day for 10 days, then once a day as needed -allergies., Disp: , Rfl:  .  metFORMIN (GLUCOPHAGE) 500 MG tablet, Take 500 mg by mouth daily with breakfast., Disp: , Rfl:  .  potassium chloride (K-DUR,KLOR-CON) 10 MEQ tablet, Take 10 mEq by mouth daily. , Disp: , Rfl:  .  Pyridoxine HCl (VITAMIN B6) 250 MG TABS, Take 1 tablet (250 mg total) by mouth daily., Disp: 30 tablet, Rfl: 11 .  raloxifene (EVISTA) 60 MG tablet, Take 60 mg by mouth daily., Disp: , Rfl:  .  tiZANidine (ZANAFLEX) 4 MG capsule, Take 4 mg by mouth at bedtime., Disp: , Rfl:  .  Vitamin D, Ergocalciferol, (DRISDOL) 50000 UNITS CAPS  capsule, TAKE ONE CAPSULE BY MOUTH 3 DAYS WEEK WITH A MEAL FOR LOW VITAMIN D, Disp: , Rfl:  No current facility-administered medications for this visit.  Facility-Administered Medications Ordered in Other Visits:  .  0.9 %  sodium chloride infusion, 250 mL, Intravenous, Once, [name], MD, Stopped at 10/11/11 1835  Allergies: No Known Allergies  Past Medical History, Surgical history, Social history, and Family History were reviewed and updated.  Review of Systems: As above  Physical Exam:  height is 5\' 1"  (1.549 m) and weight is 178 lb (80.74 kg). Her oral temperature is 97.6 F (36.4 C). Her blood pressure is 151/86 and her pulse is 69. Her respiration is 14.   Well-developed well-nourished African-American female. Head and neck exam shows no ocular or oral lesions. She has no adenopathy in the neck.. Lungs are with scattered wheezes bilaterally. She has some crackles bilaterally. She has decent air movement. Cardiac exam regular rate and rhythm. There are no murmurs, rubs or bruits. Abdomen is soft. She has good bowel sounds. There is no fluid wave. There is no palpable liver or spleen tip. Extremities shows no clubbing, cyanosis or edema. The right foot of her joints. Neurological exam shows no focal neurological deficits.  Lab Results  Component Value Date   WBC 6.4 02/05/2014   HGB 12.2 02/05/2014  HCT 38.0 02/05/2014   MCV 82 02/05/2014   PLT 135* 02/05/2014     Chemistry      Component Value Date/Time   NA 140 02/05/2014 0853   NA 131* 11/06/2012 2050   NA 139 05/28/2012 1036   K 3.9 02/05/2014 0853   K 3.0* 11/06/2012 2050   K 3.7 05/28/2012 1036   CL 103 02/05/2014 0853   CL 92* 11/06/2012 2050   CL 99 05/28/2012 1036   CO2 26 02/05/2014 0853   CO2 26 11/06/2012 2050   CO2 31* 05/28/2012 1036   BUN 14 02/05/2014 0853   BUN 10 11/06/2012 2050   BUN 12.3 05/28/2012 1036   CREATININE 0.6 02/05/2014 0853   CREATININE 0.70 11/06/2012 2050   CREATININE 0.7  05/28/2012 1036      Component Value Date/Time   CALCIUM 8.2 02/05/2014 0853   CALCIUM 8.7 11/06/2012 2050   CALCIUM 9.3 05/28/2012 1036   ALKPHOS 121* 02/05/2014 0853   ALKPHOS 103 11/06/2012 2050   AST 21 02/05/2014 0853   AST 47* 11/06/2012 2050   ALT 21 02/05/2014 0853   ALT 46* 11/06/2012 2050   BILITOT 0.60 02/05/2014 0853   BILITOT 1.5* 11/06/2012 2050         Impression and Plan: [name] is an 81 year old Afro-American female. She has a history of marginal zone lymphoma. She is treated with systemic chemotherapy for this. She got into remission. She has remained in remission. We are giving her 2 years of  Rituxan. I think she'll finish up in June 2016.  Overall, she looks great.  We will plan to give back in June for final Rituxan dose.  [name], MD 3/16/201611:22 AM

## 2014-05-07 NOTE — Patient Instructions (Signed)

## 2014-08-05 ENCOUNTER — Telehealth: Payer: Self-pay | Admitting: Hematology & Oncology

## 2014-08-05 NOTE — Telephone Encounter (Signed)
[place]: [id number] Status: Approved  Dates: 08/08/2014 - 11/06/2014 CPT: Y5859; Y9244

## 2014-08-07 ENCOUNTER — Telehealth: Payer: Self-pay | Admitting: Hematology & Oncology

## 2014-08-07 ENCOUNTER — Encounter: Payer: Self-pay | Admitting: Family

## 2014-08-07 ENCOUNTER — Other Ambulatory Visit (HOSPITAL_BASED_OUTPATIENT_CLINIC_OR_DEPARTMENT_OTHER): Payer: Medicare HMO

## 2014-08-07 ENCOUNTER — Ambulatory Visit (HOSPITAL_BASED_OUTPATIENT_CLINIC_OR_DEPARTMENT_OTHER): Payer: Medicare HMO | Admitting: Family

## 2014-08-07 ENCOUNTER — Ambulatory Visit (HOSPITAL_BASED_OUTPATIENT_CLINIC_OR_DEPARTMENT_OTHER): Payer: Medicare HMO

## 2014-08-07 VITALS — BP 160/62 | HR 63 | Resp 20

## 2014-08-07 VITALS — BP 189/60 | HR 77 | Temp 97.5°F | Resp 16 | Ht 62.0 in | Wt 177.0 lb

## 2014-08-07 DIAGNOSIS — Z5112 Encounter for antineoplastic immunotherapy: Secondary | ICD-10-CM

## 2014-08-07 DIAGNOSIS — C8587 Other specified types of non-Hodgkin lymphoma, spleen: Secondary | ICD-10-CM

## 2014-08-07 DIAGNOSIS — C8307 Small cell B-cell lymphoma, spleen: Secondary | ICD-10-CM

## 2014-08-07 LAB — CBC WITH DIFFERENTIAL (CANCER CENTER ONLY)
BASO#: 0 10*3/uL (ref 0.0–0.2)
BASO%: 0.2 % (ref 0.0–2.0)
EOS ABS: 0.1 10*3/uL (ref 0.0–0.5)
EOS%: 2.5 % (ref 0.0–7.0)
HCT: 39.7 % (ref 34.8–46.6)
HEMOGLOBIN: 13.1 g/dL (ref 11.6–15.9)
LYMPH#: 0.8 10*3/uL — AB (ref 0.9–3.3)
LYMPH%: 15.5 % (ref 14.0–48.0)
MCH: 26.5 pg (ref 26.0–34.0)
MCHC: 33 g/dL (ref 32.0–36.0)
MCV: 80 fL — ABNORMAL LOW (ref 81–101)
MONO#: 0.5 10*3/uL (ref 0.1–0.9)
MONO%: 9.5 % (ref 0.0–13.0)
NEUT%: 72.3 % (ref 39.6–80.0)
NEUTROS ABS: 3.8 10*3/uL (ref 1.5–6.5)
Platelets: 143 10*3/uL — ABNORMAL LOW (ref 145–400)
RBC: 4.94 10*6/uL (ref 3.70–5.32)
RDW: 14.5 % (ref 11.1–15.7)
WBC: 5.2 10*3/uL (ref 3.9–10.0)

## 2014-08-07 LAB — CMP (CANCER CENTER ONLY)
ALT: 23 U/L (ref 10–47)
AST: 24 U/L (ref 11–38)
Albumin: 4 g/dL (ref 3.3–5.5)
Alkaline Phosphatase: 143 U/L — ABNORMAL HIGH (ref 26–84)
BILIRUBIN TOTAL: 0.9 mg/dL (ref 0.20–1.60)
BUN, Bld: 9 mg/dL (ref 7–22)
CALCIUM: 8.9 mg/dL (ref 8.0–10.3)
CHLORIDE: 99 meq/L (ref 98–108)
CO2: 27 mEq/L (ref 18–33)
CREATININE: 1 mg/dL (ref 0.6–1.2)
GLUCOSE: 106 mg/dL (ref 73–118)
Potassium: 3.9 mEq/L (ref 3.3–4.7)
SODIUM: 140 meq/L (ref 128–145)
TOTAL PROTEIN: 6.3 g/dL — AB (ref 6.4–8.1)

## 2014-08-07 LAB — LACTATE DEHYDROGENASE: LDH: 240 U/L (ref 94–250)

## 2014-08-07 MED ORDER — SODIUM CHLORIDE 0.9 % IV SOLN
375.0000 mg/m2 | Freq: Once | INTRAVENOUS | Status: AC
  Administered 2014-08-07: 700 mg via INTRAVENOUS
  Filled 2014-08-07: qty 70

## 2014-08-07 MED ORDER — ACETAMINOPHEN 325 MG PO TABS
650.0000 mg | ORAL_TABLET | Freq: Once | ORAL | Status: AC
  Administered 2014-08-07: 650 mg via ORAL

## 2014-08-07 MED ORDER — ACETAMINOPHEN 325 MG PO TABS
ORAL_TABLET | ORAL | Status: AC
  Filled 2014-08-07: qty 2

## 2014-08-07 MED ORDER — DIPHENHYDRAMINE HCL 25 MG PO CAPS
50.0000 mg | ORAL_CAPSULE | Freq: Once | ORAL | Status: AC
  Administered 2014-08-07: 50 mg via ORAL

## 2014-08-07 MED ORDER — SODIUM CHLORIDE 0.9 % IV SOLN
Freq: Once | INTRAVENOUS | Status: AC
  Administered 2014-08-07: 14:00:00 via INTRAVENOUS

## 2014-08-07 MED ORDER — DIPHENHYDRAMINE HCL 25 MG PO CAPS
ORAL_CAPSULE | ORAL | Status: AC
  Filled 2014-08-07: qty 2

## 2014-08-07 MED ORDER — SODIUM CHLORIDE 0.9 % IJ SOLN
10.0000 mL | INTRAMUSCULAR | Status: DC | PRN
  Administered 2014-08-07: 10 mL via INTRAVENOUS
  Filled 2014-08-07: qty 10

## 2014-08-07 MED ORDER — HEPARIN SOD (PORK) LOCK FLUSH 100 UNIT/ML IV SOLN
500.0000 [IU] | Freq: Once | INTRAVENOUS | Status: AC
  Administered 2014-08-07: 500 [IU] via INTRAVENOUS
  Filled 2014-08-07: qty 5

## 2014-08-07 NOTE — Telephone Encounter (Signed)
UPDATE  Pt got her chemo dates mixed up and is in the ofc today. I spoke w Waylan Rocher [facility] to get the dos updated for today of 08/07/2014.  [place]: [id number] Status: Approved  Dates: 08/07/2014 - 11/05/2014 CPT: M0375; O3606

## 2014-08-07 NOTE — Patient Instructions (Signed)

## 2014-08-07 NOTE — Progress Notes (Signed)
Hematology and Oncology Follow Up Visit  Kristine Sullivan [id number] 03/18/1931 81 y.o. 08/07/2014   Principle Diagnosis:  Marginal zone lymphoma  Current Therapy:   Maintenance Rituxan - 2 years completed in June 2016    Interim History:  Kristine Sullivan is here today for follow-up and her final dose of maintenance Rituxan. She is doing quite well and states that through this entire process she has not felt "bad."  She is still concerned with her weight. She is walking to and from a friends house for exercise. Her appetite is good and she is trying to eat healthier by cutting out carbs. She is drinking,ots of water to stay hydrated.  She has had no problem with infections. No fever, chills, n/v, cough, rash, dizziness, chest pain, palpitations, abdominal pain, constipation, diarrhea, blood in urine or stool.  No lymphadenopathy found on assessment.  She has some SOB with exertion but feels this is from her weight.  No swelling, tenderness, numbness or tingling in her extremities. No new aches or pains.   Medications:    Medication List       This list is accurate as of: 08/07/14 12:48 PM.  Always use your most recent med list.               aspirin 81 MG tablet  Take 81 mg by mouth daily.     atorvastatin 80 MG tablet  Commonly known as:  LIPITOR  Take 40 mg by mouth daily.     Calcium + D3 600-200 MG-UNIT Tabs  Take by mouth every morning.     ergocalciferol 50000 UNITS capsule  Commonly known as:  VITAMIN D2  Take 1 capsule (50,000 Units total) by mouth once a week.     Vitamin D (Ergocalciferol) 50000 UNITS Caps capsule  Commonly known as:  DRISDOL  TAKE ONE CAPSULE BY MOUTH 3 DAYS WEEK WITH A MEAL FOR LOW VITAMIN D     furosemide 40 MG tablet  Commonly known as:  LASIX  Take 40 mg by mouth daily.     loratadine 10 MG tablet  Commonly known as:  CLARITIN  OTC Take by mouth one a day for 10 days, then once a day as needed -allergies.     metFORMIN 500 MG tablet    Commonly known as:  GLUCOPHAGE  Take 500 mg by mouth daily with breakfast.     potassium chloride 10 MEQ tablet  Commonly known as:  K-DUR,KLOR-CON  Take 10 mEq by mouth daily.     raloxifene 60 MG tablet  Commonly known as:  EVISTA  Take 60 mg by mouth daily.     tiZANidine 4 MG capsule  Commonly known as:  ZANAFLEX  Take 4 mg by mouth at bedtime.     Vitamin B6 250 MG Tabs  Take 1 tablet (250 mg total) by mouth daily.        Allergies: No Known Allergies  Past Medical History, Surgical history, Social history, and Family History were reviewed and updated.  Review of Systems: All other 10 point review of systems is negative.   Physical Exam:  vitals were not taken for this visit.  Wt Readings from Last 3 Encounters:  05/07/14 178 lb (80.74 kg)  02/05/14 180 lb 6.4 oz (81.829 kg)  11/13/13 175 lb (79.379 kg)    Ocular: Sclerae unicteric, pupils equal, round and reactive to light Ear-nose-throat: Oropharynx clear, dentition fair Lymphatic: No cervical or supraclavicular adenopathy Lungs no rales or rhonchi, good  excursion bilaterally Heart regular rate and rhythm, no murmur appreciated Abd soft, nontender, positive bowel sounds MSK no focal spinal tenderness, no joint edema Neuro: non-focal, well-oriented, appropriate affect Breasts: Deferred  Lab Results  Component Value Date   WBC 4.8 05/07/2014   HGB 13.0 05/07/2014   HCT 40.4 05/07/2014   MCV 81 05/07/2014   PLT 130* 05/07/2014   Lab Results  Component Value Date   FERRITIN 234 09/16/2011   IRON 72 09/16/2011   TIBC 528* 09/16/2011   UIBC 456* 09/16/2011   IRONPCTSAT 14* 09/16/2011   Lab Results  Component Value Date   RETICCTPCT 1.2 11/13/2013   RBC 5.02 05/07/2014   RETICCTABS 59.2 11/13/2013   No results found for: Nils Pyle [facility] Lab Results  Component Value Date   IGGSERUM 557* 06/19/2013   IGA 100 06/19/2013   IGMSERUM 95 06/19/2013   Lab Results  Component  Value Date   TOTALPROTELP 6.3 09/16/2011   ALBUMINELP 59.9 09/16/2011   A1GS 7.3* 09/16/2011   A2GS 9.9 09/16/2011   BETS 8.4* 09/16/2011   BETA2SER 4.8 09/16/2011   GAMS 9.7* 09/16/2011   MSPIKE NOT DET 09/16/2011   SPEI * 09/16/2011     Chemistry      Component Value Date/Time   NA 140 05/07/2014 1033   NA 131* 11/06/2012 2050   NA 139 05/28/2012 1036   K 3.6 05/07/2014 1033   K 3.0* 11/06/2012 2050   K 3.7 05/28/2012 1036   CL 103 05/07/2014 1033   CL 92* 11/06/2012 2050   CL 99 05/28/2012 1036   CO2 30 05/07/2014 1033   CO2 26 11/06/2012 2050   CO2 31* 05/28/2012 1036   BUN 12 05/07/2014 1033   BUN 10 11/06/2012 2050   BUN 12.3 05/28/2012 1036   CREATININE 0.8 05/07/2014 1033   CREATININE 0.70 11/06/2012 2050   CREATININE 0.7 05/28/2012 1036      Component Value Date/Time   CALCIUM 8.9 05/07/2014 1033   CALCIUM 8.7 11/06/2012 2050   CALCIUM 9.3 05/28/2012 1036   ALKPHOS 155* 05/07/2014 1033   ALKPHOS 103 11/06/2012 2050   AST 27 05/07/2014 1033   AST 47* 11/06/2012 2050   ALT 19 05/07/2014 1033   ALT 46* 11/06/2012 2050   BILITOT 0.80 05/07/2014 1033   BILITOT 1.5* 11/06/2012 2050     Impression and Plan: Kristine Sullivan is an 81 year old African American female with a history of marginal zone lymphoma. She was treated with systemic chemotherapy for this and has remained in remission. There has been no evidence of recurrence. She finishes 2 years of maintenance Rituxan today.  She is doing well and is asymptomatic at this time.  We will plan to see her back in 4 months for labs and follow-up.  She knows to call with any questions or concerns. We can certainly see her sooner if need be.   Kristine Bottom, NP 6/16/201612:48 PM

## 2014-08-08 ENCOUNTER — Other Ambulatory Visit: Payer: Medicare HMO

## 2014-08-08 ENCOUNTER — Ambulatory Visit: Payer: Medicare HMO

## 2014-08-08 ENCOUNTER — Ambulatory Visit: Payer: Medicare HMO | Admitting: Family

## 2014-09-01 DIAGNOSIS — R600 Localized edema: Secondary | ICD-10-CM | POA: Insufficient documentation

## 2014-09-03 IMAGING — XA IR FLUORO GUIDE CV LINE*R*
1 series · 1 of 1 positions shown · non-contrast
Comparison: none

Clinical Data/Indication: Lymphoma

TUNNEL POWER PORT PLACEMENT WITH SUBCUTANEOUS POCKET UTILIZING
ULTRASOUND & FLOUROSCOPY
Sedation: Versed 2.0 mg, Fentanyl 100 mcg.
Total Moderate Sedation Time: 25 minutes.
As antibiotic prophylaxis, Ancef  was ordered pre-procedure and
administered intravenously within one hour of incision.
Fluoroscopy Time: 0.5 minutes.
Procedure:  After written informed consent was obtained, patient
was placed in the supine position on angiographic table. The right
neck and chest was prepped and draped in a sterile fashion.
Lidocaine was utilized for local anesthesia.  The right internal
jugular vein was noted to be patent initially with ultrasound.
Under sonographic guidance, a micropuncture needle was inserted
into the right IJ vein (Ultrasound and fluoroscopic image
documentation was performed). The needle was removed over an 018
wire which was exchanged for a Amplatz.  This was advanced into the
IVC.  An 8-French dilator was advanced over the Amplatz.
A small incision was made in the right upper chest over the
anterior right second rib.  Utilizing blunt dissection, a
subcutaneous pocket was created in the caudal direction. The pocket
was irrigated with a copious amount of sterile normal saline.  The
port catheter was tunneled from the chest incision, and out the
neck incision.  The reservoir was inserted into the subcutaneous
pocket and secured with two 3-0 Ethilon stitches.  A peel-away
sheath was advanced over the Amplatz wire.  The port catheter was
cut to measure length and inserted through the peel-away sheath.
The peel-away sheath was removed.  The chest incision was closed
with 3-0 Vicryl interrupted stitches for the subcutaneous tissue
and a running of 4-0 Vicryl subcuticular stitch for the skin.  The
neck incision was closed with a 4-0 Vicryl subcuticular stitch.
Derma-bond was applied to both surgical incisions.  The port
reservoir was flushed and instilled with heparinized saline.  No
complications.

[Series 300: line placements · 1 of 1 slices shown]
[im 1/1]
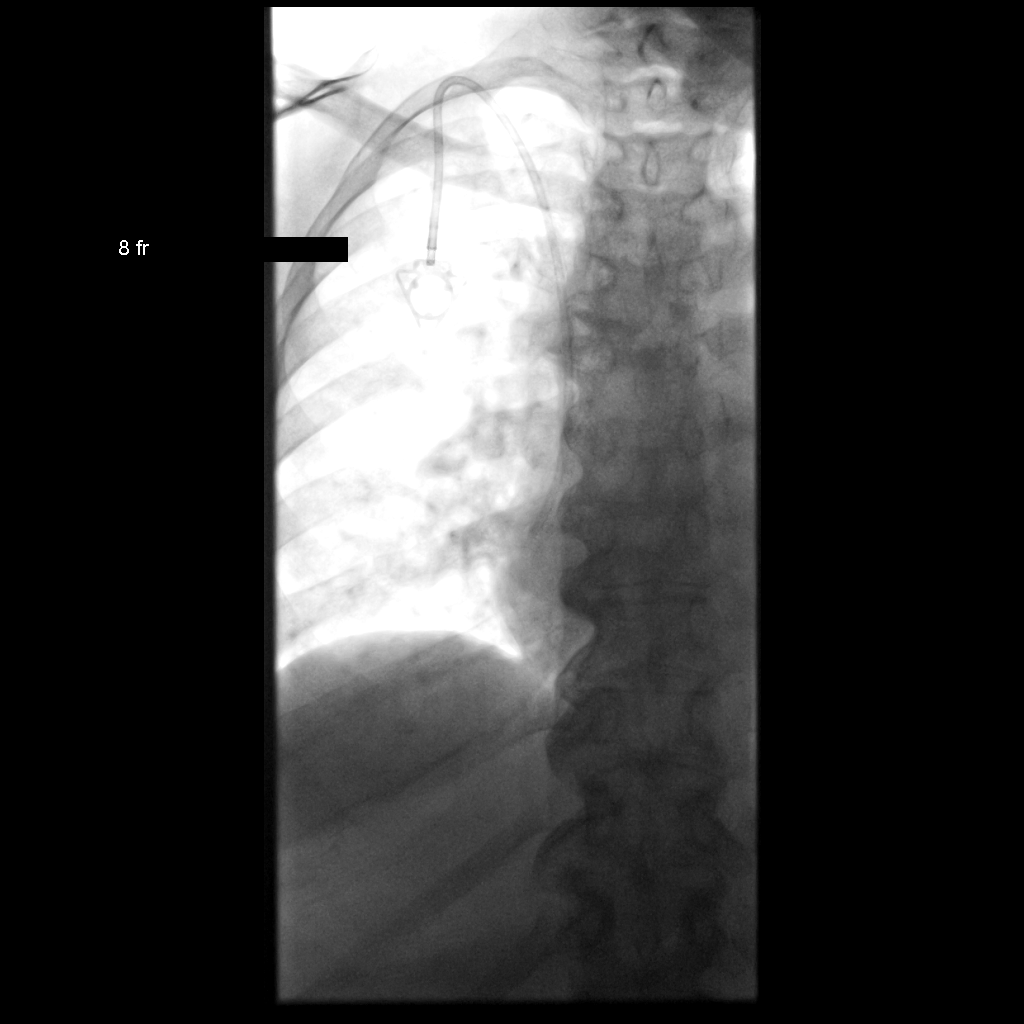

[1 of 1 positions shown; findings below may reference images not displayed]

FINDINGS: A right IJ vein Port-A-Cath is in place with its tip at
the cavoatrial junction.
IMPRESSION: Successful 8 French right internal jugular vein power port
placement with its tip at the SVC/RA junction.

## 2014-11-04 ENCOUNTER — Other Ambulatory Visit: Payer: Self-pay | Admitting: *Deleted

## 2014-11-04 DIAGNOSIS — E559 Vitamin D deficiency, unspecified: Secondary | ICD-10-CM

## 2014-11-04 DIAGNOSIS — C8307 Small cell B-cell lymphoma, spleen: Secondary | ICD-10-CM

## 2014-11-04 MED ORDER — ERGOCALCIFEROL 1.25 MG (50000 UT) PO CAPS: 50000.0000 [IU] | ORAL_CAPSULE | ORAL | Status: DC

## 2014-12-08 ENCOUNTER — Ambulatory Visit (HOSPITAL_BASED_OUTPATIENT_CLINIC_OR_DEPARTMENT_OTHER): Payer: Medicare Other

## 2014-12-08 ENCOUNTER — Ambulatory Visit (HOSPITAL_BASED_OUTPATIENT_CLINIC_OR_DEPARTMENT_OTHER): Payer: Medicare Other | Admitting: Hematology & Oncology

## 2014-12-08 ENCOUNTER — Other Ambulatory Visit (HOSPITAL_BASED_OUTPATIENT_CLINIC_OR_DEPARTMENT_OTHER): Payer: Medicare Other

## 2014-12-08 VITALS — BP 139/79 | HR 70 | Temp 97.8°F | Resp 14 | Ht 62.0 in | Wt 172.0 lb

## 2014-12-08 DIAGNOSIS — C8307 Small cell B-cell lymphoma, spleen: Secondary | ICD-10-CM

## 2014-12-08 DIAGNOSIS — C8587 Other specified types of non-Hodgkin lymphoma, spleen: Secondary | ICD-10-CM

## 2014-12-08 LAB — CBC WITH DIFFERENTIAL (CANCER CENTER ONLY)
BASO#: 0 10*3/uL (ref 0.0–0.2)
BASO%: 0.2 % (ref 0.0–2.0)
EOS%: 2.1 % (ref 0.0–7.0)
Eosinophils Absolute: 0.1 10*3/uL (ref 0.0–0.5)
HCT: 39.4 % (ref 34.8–46.6)
HGB: 12.7 g/dL (ref 11.6–15.9)
LYMPH#: 0.7 10*3/uL — ABNORMAL LOW (ref 0.9–3.3)
LYMPH%: 15.2 % (ref 14.0–48.0)
MCH: 26.3 pg (ref 26.0–34.0)
MCHC: 32.2 g/dL (ref 32.0–36.0)
MCV: 82 fL (ref 81–101)
MONO#: 0.4 10*3/uL (ref 0.1–0.9)
MONO%: 8.9 % (ref 0.0–13.0)
NEUT%: 73.6 % (ref 39.6–80.0)
NEUTROS ABS: 3.2 10*3/uL (ref 1.5–6.5)
PLATELETS: 141 10*3/uL — AB (ref 145–400)
RBC: 4.83 10*6/uL (ref 3.70–5.32)
RDW: 14.9 % (ref 11.1–15.7)
WBC: 4.3 10*3/uL (ref 3.9–10.0)

## 2014-12-08 LAB — CMP (CANCER CENTER ONLY)
ALT(SGPT): 26 U/L (ref 10–47)
AST: 32 U/L (ref 11–38)
Albumin: 3.8 g/dL (ref 3.3–5.5)
Alkaline Phosphatase: 140 U/L — ABNORMAL HIGH (ref 26–84)
BUN: 12 mg/dL (ref 7–22)
CHLORIDE: 101 meq/L (ref 98–108)
CO2: 30 meq/L (ref 18–33)
CREATININE: 0.6 mg/dL (ref 0.6–1.2)
Calcium: 9 mg/dL (ref 8.0–10.3)
Glucose, Bld: 130 mg/dL — ABNORMAL HIGH (ref 73–118)
Potassium: 3.3 mEq/L (ref 3.3–4.7)
SODIUM: 137 meq/L (ref 128–145)
Total Bilirubin: 0.8 mg/dl (ref 0.20–1.60)
Total Protein: 6.4 g/dL (ref 6.4–8.1)

## 2014-12-08 LAB — LACTATE DEHYDROGENASE: LDH: 239 U/L (ref 94–250)

## 2014-12-08 MED ORDER — HEPARIN SOD (PORK) LOCK FLUSH 100 UNIT/ML IV SOLN
500.0000 [IU] | Freq: Once | INTRAVENOUS | Status: AC
  Administered 2014-12-08: 500 [IU] via INTRAVENOUS
  Filled 2014-12-08: qty 5

## 2014-12-08 MED ORDER — SODIUM CHLORIDE 0.9 % IJ SOLN
10.0000 mL | INTRAMUSCULAR | Status: DC | PRN
  Administered 2014-12-08: 10 mL via INTRAVENOUS
  Filled 2014-12-08: qty 10

## 2014-12-08 NOTE — Patient Instructions (Signed)

## 2014-12-08 NOTE — Progress Notes (Signed)
Hematology and Oncology Follow Up Visit  Kristine Sullivan [id number] 04-Aug-1931 81 y.o. 12/08/2014   Principle Diagnosis:  Marginal zone lymphoma  Current Therapy:   Patient completed 2 years of maintenance Rituxan in June 2016     Interim History:  Kristine Sullivan is back for followup. She looks great. She feels good.  She completed her Rituxan back in June. She is thankful that she is all done with this.  For now, I don't think we have to worry about the Port-A-Cath. I think we probably get this removed. She's been in remission now for over 2 years. I think that the risk of her mental zone lymphoma is quite low. Even if it does come back, we might go to get her on where the new oral agents.  She is feeling good. She's had no fever. She's had no bleeding. There's been no change in bowel or bladder habits. She's had no nausea or vomiting. She's had no leg swelling.  Overall, her performance status is ECOG 1. 2014.  Medications:  Current outpatient prescriptions:  .  aspirin 81 MG tablet, Take 81 mg by mouth daily., Disp: , Rfl:  .  atorvastatin (LIPITOR) 80 MG tablet, Take 40 mg by mouth daily. , Disp: , Rfl:  .  ergocalciferol (VITAMIN D2) 50000 UNITS capsule, Take 1 capsule (50,000 Units total) by mouth once a week. 3 month supply, Disp: 12 capsule, Rfl: 3 .  furosemide (LASIX) 40 MG tablet, Take 40 mg by mouth daily., Disp: , Rfl:  .  loratadine (CLARITIN) 10 MG tablet, OTC Take by mouth one a day for 10 days, then once a day as needed -allergies., Disp: , Rfl:  .  metFORMIN (GLUCOPHAGE) 500 MG tablet, Take 500 mg by mouth daily with breakfast., Disp: , Rfl:  .  potassium chloride (K-DUR,KLOR-CON) 10 MEQ tablet, Take 10 mEq by mouth daily. , Disp: , Rfl:  .  raloxifene (EVISTA) 60 MG tablet, Take 60 mg by mouth daily., Disp: , Rfl:  .  tiZANidine (ZANAFLEX) 4 MG capsule, Take 4 mg by mouth at bedtime., Disp: , Rfl:  .  Vitamin D, Ergocalciferol, (DRISDOL) 50000 UNITS CAPS capsule,  TAKE ONE CAPSULE BY MOUTH 3 DAYS WEEK WITH A MEAL FOR LOW VITAMIN D, Disp: , Rfl:  No current facility-administered medications for this visit.  Facility-Administered Medications Ordered in Other Visits:  .  0.9 %  sodium chloride infusion, 250 mL, Intravenous, Once, [name], MD, Stopped at 10/11/11 1835  Allergies: No Known Allergies  Past Medical History, Surgical history, Social history, and Family History were reviewed and updated.  Review of Systems: As above  Physical Exam:  height is 5\' 2"  (1.575 m) and weight is 172 lb (78.019 kg). Her oral temperature is 97.8 F (36.6 C). Her blood pressure is 139/79 and her pulse is 70. Her respiration is 14.   Well-developed well-nourished African-American female. Head and neck exam shows no ocular or oral lesions. She has no adenopathy in the neck.. Lungs are with scattered wheezes bilaterally. She has some crackles bilaterally. She has decent air movement. Cardiac exam regular rate and rhythm. There are no murmurs, rubs or bruits. Abdomen is soft. She has good bowel sounds. There is no fluid wave. There is no palpable liver or spleen tip. Extremities shows no clubbing, cyanosis or edema. The right foot of her joints. Neurological exam shows no focal neurological deficits.  Lab Results  Component Value Date   WBC 4.3 12/08/2014  HGB 12.7 12/08/2014   HCT 39.4 12/08/2014   MCV 82 12/08/2014   PLT 141* 12/08/2014     Chemistry      Component Value Date/Time   NA 137 12/08/2014 1138   NA 131* 11/06/2012 2050   NA 139 05/28/2012 1036   K 3.3 12/08/2014 1138   K 3.0* 11/06/2012 2050   K 3.7 05/28/2012 1036   CL 101 12/08/2014 1138   CL 92* 11/06/2012 2050   CL 99 05/28/2012 1036   CO2 30 12/08/2014 1138   CO2 26 11/06/2012 2050   CO2 31* 05/28/2012 1036   BUN 12 12/08/2014 1138   BUN 10 11/06/2012 2050   BUN 12.3 05/28/2012 1036   CREATININE 0.6 12/08/2014 1138   CREATININE 0.70 11/06/2012 2050   CREATININE 0.7  05/28/2012 1036      Component Value Date/Time   CALCIUM 9.0 12/08/2014 1138   CALCIUM 8.7 11/06/2012 2050   CALCIUM 9.3 05/28/2012 1036   ALKPHOS 140* 12/08/2014 1138   ALKPHOS 103 11/06/2012 2050   AST 32 12/08/2014 1138   AST 47* 11/06/2012 2050   ALT 26 12/08/2014 1138   ALT 46* 11/06/2012 2050   BILITOT 0.80 12/08/2014 1138   BILITOT 1.5* 11/06/2012 2050         Impression and Plan: Kristine Sullivan is an 81 year old African-American female. She was diagnosed with marginal zone lymphoma. She had autoimmune hemolytic anemia. With treatment of the marginal zone lymphoma, the hemolytic anemia resolved.  For now, we will plan to get the Port-A-Cath taken out. I think this would be reasonable.  I will plan to get her back in about 4 months. I think we can move her points out a little bit longer.  She is very happy that she does not eat her Port-A-Cath. I reassured her that I just do not think that we would have to use it in the future.   [name], MD 10/17/201612:40 PM

## 2014-12-11 DIAGNOSIS — E119 Type 2 diabetes mellitus without complications: Secondary | ICD-10-CM | POA: Insufficient documentation

## 2014-12-11 DIAGNOSIS — E7800 Pure hypercholesterolemia, unspecified: Secondary | ICD-10-CM | POA: Insufficient documentation

## 2014-12-16 ENCOUNTER — Telehealth: Payer: Self-pay | Admitting: Hematology & Oncology

## 2014-12-16 NOTE — Telephone Encounter (Signed)
Called patient's home number to schedule a Flush Appt. Left message for patient to return my call.       [facility].

## 2014-12-24 ENCOUNTER — Telehealth: Payer: Self-pay | Admitting: Hematology & Oncology

## 2014-12-24 NOTE — Telephone Encounter (Signed)
Called patient's home number. L/m for patient to return my call regarding scheduling a port flush appt.       [facility].

## 2015-01-03 IMAGING — CR DG SHOULDER 2+V*R*
3 series · 3 of 3 positions shown · non-contrast
Comparison: None.

CLINICAL DATA: Pain without trauma.  Limited mobility.  History of
lymphoma.

RIGHT SHOULDER - 2+ VIEW

[w shoulder ap internal righ]
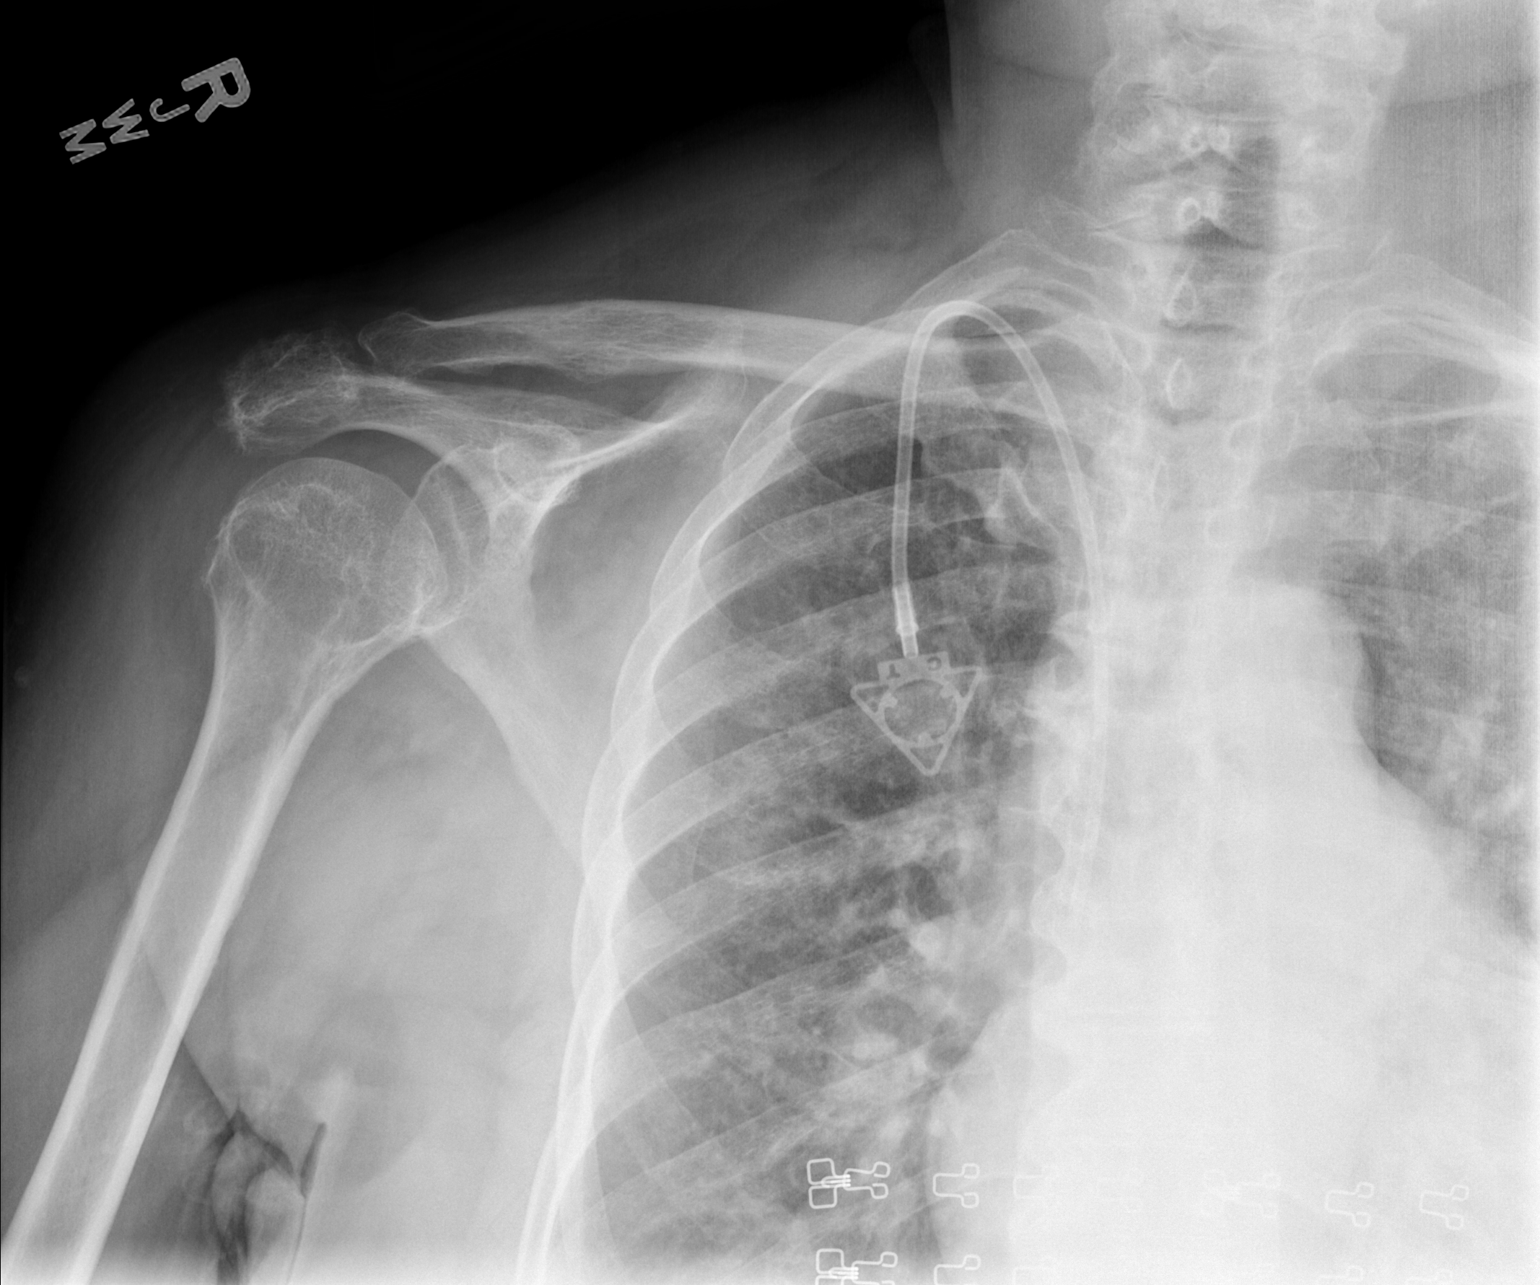

[w shoulder ap external righ]
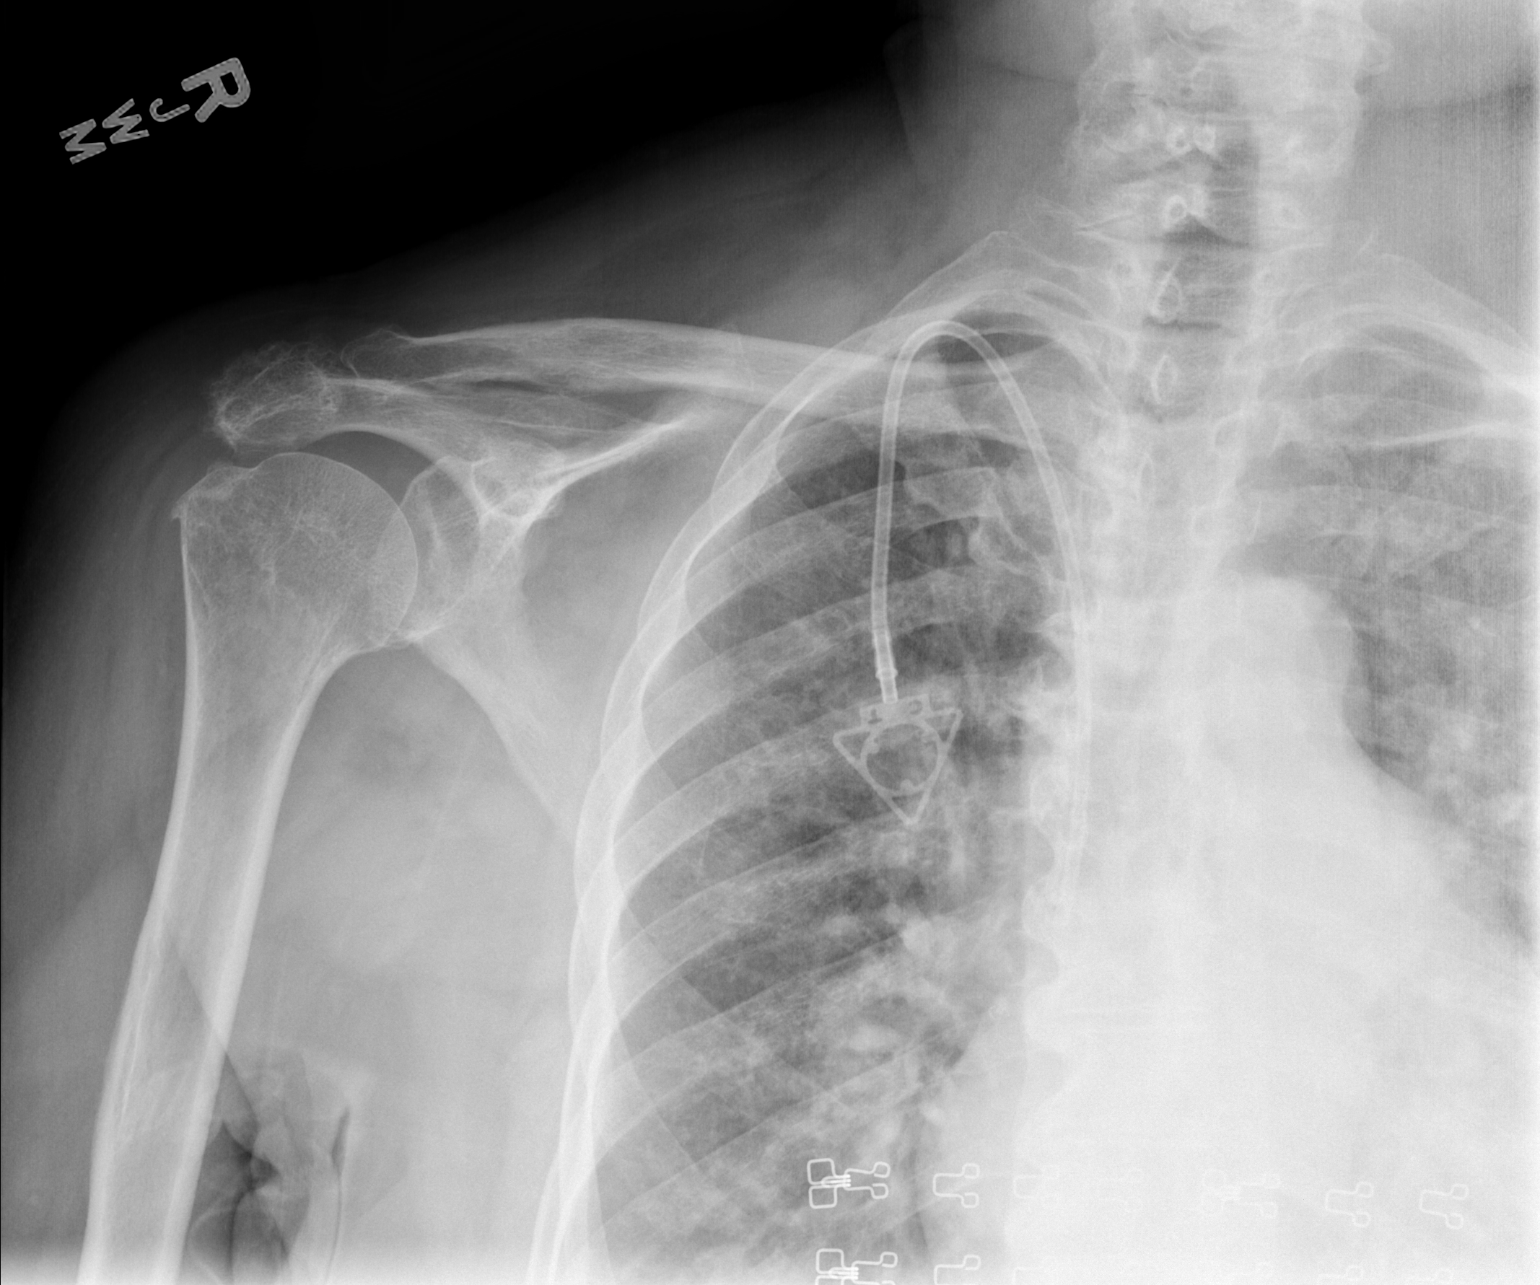

[w shoulder y view right]
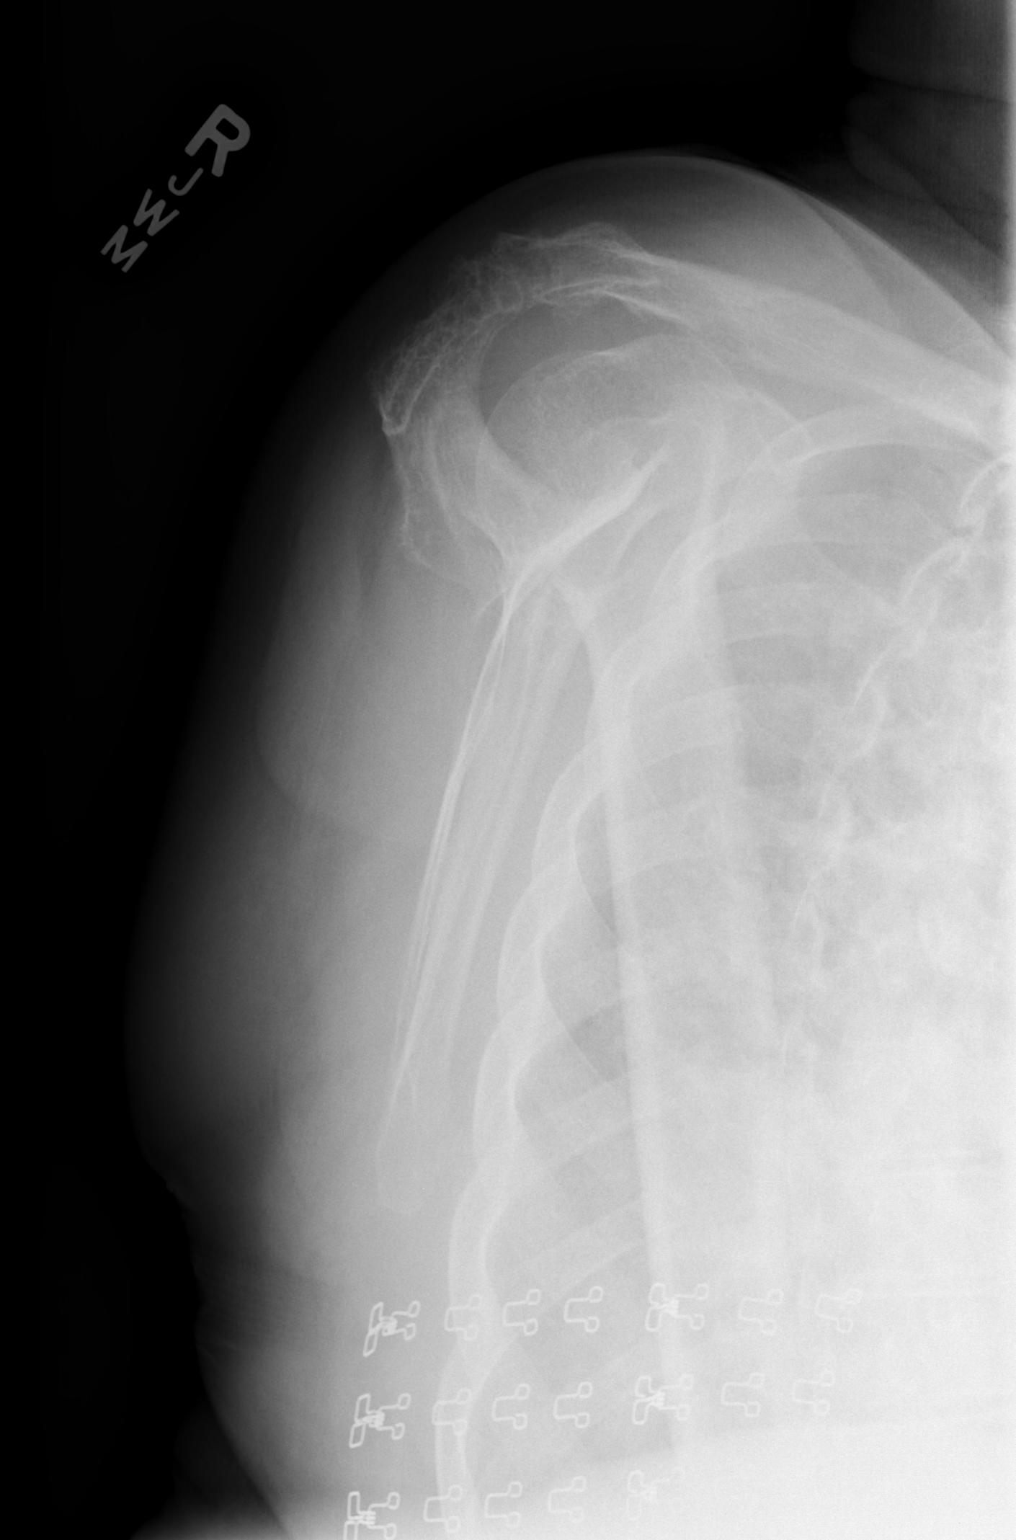

[3 of 3 positions shown; findings below may reference images not displayed]

FINDINGS: Right-sided Port-A-Cath which terminates at the mid to
low SVC.  Otherwise, normal appearance of the right hemithorax.

Degenerative changes of the acromioclavicular joint which are
moderate.  Mild degenerative irregularity of the rotator cuff
insertion.  Humeral head is borderline high-riding, suggesting
chronic rotator cuff insufficiency. No acute fracture or
dislocation.
IMPRESSION: Degenerative change, without acute osseous finding.

## 2015-01-22 ENCOUNTER — Ambulatory Visit (HOSPITAL_BASED_OUTPATIENT_CLINIC_OR_DEPARTMENT_OTHER): Payer: Medicare Other

## 2015-01-22 VITALS — BP 132/57 | HR 67 | Temp 97.6°F | Resp 20

## 2015-01-22 DIAGNOSIS — C8307 Small cell B-cell lymphoma, spleen: Secondary | ICD-10-CM

## 2015-01-22 DIAGNOSIS — Z452 Encounter for adjustment and management of vascular access device: Secondary | ICD-10-CM | POA: Diagnosis not present

## 2015-01-22 MED ORDER — HEPARIN SOD (PORK) LOCK FLUSH 100 UNIT/ML IV SOLN
500.0000 [IU] | Freq: Once | INTRAVENOUS | Status: AC
  Administered 2015-01-22: 500 [IU] via INTRAVENOUS
  Filled 2015-01-22: qty 5

## 2015-01-22 MED ORDER — SODIUM CHLORIDE 0.9 % IJ SOLN
10.0000 mL | INTRAMUSCULAR | Status: DC | PRN
  Administered 2015-01-22: 10 mL via INTRAVENOUS
  Filled 2015-01-22: qty 10

## 2015-01-22 NOTE — Patient Instructions (Signed)
Implanted Port Insertion, Care After °Refer to this sheet in the next few weeks. These instructions provide you with information on caring for yourself after your procedure. Your health care provider may also give you more specific instructions. Your treatment has been planned according to current medical practices, but problems sometimes occur. Call your health care provider if you have any problems or questions after your procedure. °WHAT TO EXPECT AFTER THE PROCEDURE °After your procedure, it is typical to have the following:  °· Discomfort at the port insertion site. Ice packs to the area will help. °· Bruising on the skin over the port. This will subside in 3-4 days. °HOME CARE INSTRUCTIONS °· After your port is placed, you will get a manufacturer's information card. The card has information about your port. Keep this card with you at all times.   °· Know what kind of port you have. There are many types of ports available.   °· Wear a medical alert bracelet in case of an emergency. This can help alert health care workers that you have a port.   °· The port can stay in for as long as your health care provider believes it is necessary.   °· A home health care nurse may give medicines and take care of the port.   °· You or a family member can get special training and directions for giving medicine and taking care of the port at home.   °SEEK MEDICAL CARE IF:  °· Your port does not flush or you are unable to get a blood return.   °· You have a fever or chills. °SEEK IMMEDIATE MEDICAL CARE IF: °· You have new fluid or pus coming from your incision.   °· You notice a bad smell coming from your incision site.   °· You have swelling, pain, or more redness at the incision or port site.   °· You have chest pain or shortness of breath. °  °This information is not intended to replace advice given to you by your health care provider. Make sure you discuss any questions you have with your health care provider. °  °Document  Released: 11/28/2012 Document Revised: 02/12/2013 Document Reviewed: 11/28/2012 °Elsevier Interactive Patient Education ©2016 [employer] ° °

## 2015-04-09 ENCOUNTER — Other Ambulatory Visit: Payer: Self-pay | Admitting: *Deleted

## 2015-04-09 DIAGNOSIS — C8307 Small cell B-cell lymphoma, spleen: Secondary | ICD-10-CM

## 2015-04-10 ENCOUNTER — Ambulatory Visit (HOSPITAL_BASED_OUTPATIENT_CLINIC_OR_DEPARTMENT_OTHER): Payer: Medicare Other | Admitting: Family

## 2015-04-10 ENCOUNTER — Other Ambulatory Visit (HOSPITAL_BASED_OUTPATIENT_CLINIC_OR_DEPARTMENT_OTHER): Payer: Medicare Other

## 2015-04-10 ENCOUNTER — Encounter: Payer: Self-pay | Admitting: Family

## 2015-04-10 ENCOUNTER — Ambulatory Visit: Payer: Medicare Other | Admitting: Family

## 2015-04-10 ENCOUNTER — Ambulatory Visit (HOSPITAL_BASED_OUTPATIENT_CLINIC_OR_DEPARTMENT_OTHER): Payer: Medicare Other

## 2015-04-10 VITALS — BP 147/64 | HR 65 | Temp 97.4°F | Resp 16 | Ht 62.0 in | Wt 175.0 lb

## 2015-04-10 DIAGNOSIS — Z8579 Personal history of other malignant neoplasms of lymphoid, hematopoietic and related tissues: Secondary | ICD-10-CM

## 2015-04-10 DIAGNOSIS — C8580 Other specified types of non-Hodgkin lymphoma, unspecified site: Secondary | ICD-10-CM | POA: Insufficient documentation

## 2015-04-10 DIAGNOSIS — Z95828 Presence of other vascular implants and grafts: Secondary | ICD-10-CM

## 2015-04-10 DIAGNOSIS — C8307 Small cell B-cell lymphoma, spleen: Secondary | ICD-10-CM

## 2015-04-10 LAB — COMPREHENSIVE METABOLIC PANEL
ALBUMIN: 3.7 g/dL (ref 3.5–5.0)
ALK PHOS: 144 U/L (ref 40–150)
ALT: 18 U/L (ref 0–55)
AST: 21 U/L (ref 5–34)
Anion Gap: 8 mEq/L (ref 3–11)
BILIRUBIN TOTAL: 0.48 mg/dL (ref 0.20–1.20)
BUN: 15.1 mg/dL (ref 7.0–26.0)
CALCIUM: 8.9 mg/dL (ref 8.4–10.4)
CO2: 27 mEq/L (ref 22–29)
CREATININE: 0.8 mg/dL (ref 0.6–1.1)
Chloride: 106 mEq/L (ref 98–109)
EGFR: 86 mL/min/{1.73_m2} — ABNORMAL LOW (ref >90)
Glucose: 112 mg/dl (ref 70–140)
Potassium: 4.3 mEq/L (ref 3.5–5.1)
Sodium: 141 mEq/L (ref 136–145)
Total Protein: 6.2 g/dL — ABNORMAL LOW (ref 6.4–8.3)

## 2015-04-10 LAB — CBC WITH DIFFERENTIAL (CANCER CENTER ONLY)
BASO#: 0 10*3/uL (ref 0.0–0.2)
BASO%: 0.4 % (ref 0.0–2.0)
EOS%: 2.7 % (ref 0.0–7.0)
Eosinophils Absolute: 0.1 10*3/uL (ref 0.0–0.5)
HEMATOCRIT: 40.6 % (ref 34.8–46.6)
HEMOGLOBIN: 13.1 g/dL (ref 11.6–15.9)
LYMPH#: 0.9 10*3/uL (ref 0.9–3.3)
LYMPH%: 18 % (ref 14.0–48.0)
MCH: 26.1 pg (ref 26.0–34.0)
MCHC: 32.3 g/dL (ref 32.0–36.0)
MCV: 81 fL (ref 81–101)
MONO#: 0.4 10*3/uL (ref 0.1–0.9)
MONO%: 8.2 % (ref 0.0–13.0)
NEUT%: 70.7 % (ref 39.6–80.0)
NEUTROS ABS: 3.6 10*3/uL (ref 1.5–6.5)
Platelets: 135 10*3/uL — ABNORMAL LOW (ref 145–400)
RBC: 5.01 10*6/uL (ref 3.70–5.32)
RDW: 14.5 % (ref 11.1–15.7)
WBC: 5.1 10*3/uL (ref 3.9–10.0)

## 2015-04-10 LAB — LACTATE DEHYDROGENASE: LDH: 240 U/L (ref 125–245)

## 2015-04-10 MED ORDER — SODIUM CHLORIDE 0.9% FLUSH
10.0000 mL | INTRAVENOUS | Status: DC | PRN
  Administered 2015-04-10: 10 mL via INTRAVENOUS
  Filled 2015-04-10: qty 10

## 2015-04-10 MED ORDER — HEPARIN SOD (PORK) LOCK FLUSH 100 UNIT/ML IV SOLN
500.0000 [IU] | Freq: Once | INTRAVENOUS | Status: AC
  Administered 2015-04-10: 500 [IU] via INTRAVENOUS
  Filled 2015-04-10: qty 5

## 2015-04-10 NOTE — Patient Instructions (Signed)

## 2015-04-10 NOTE — Progress Notes (Signed)
Hematology and Oncology Follow Up Visit  Kaddie Liotta [id number] [birth year] 82 y.o. 04/10/2015   Principle Diagnosis:  Marginal zone lymphoma  Current Therapy:   Maintenance Rituxan - 2 years completed in June 2016    Interim History:  Ms. Laughlin is here today for follow-up and is doing quite well. 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 She states that she is walking daily and eating healthier. She would really like to lose a bit of weight. She is making sure to stay well hydrated. She decided to keep her port a cath in place for blood draws and will need it flushed today.  No fever, chills, n/v, cough, rash, dizziness, chest pain, palpitations, abdominal pain or changes in bowel or bladder habits.  No lymphadenopathy found on assessment. She has had no episodes of bleeding or bruising.  She continues to have episodes of SOB with exertion but still feels this is from her weight.  No swelling or tenderness in her extremities. She has had some tingling in her legs and finger tips that comes and goes. This has not effected her gait or dexterity and is very mild. No c/o joint aches or "bone" pain.   Medications:    Medication List       This list is accurate as of: 04/10/15  8:39 AM.  Always use your most recent med list.               aspirin 81 MG tablet  Take 81 mg by mouth daily.     atorvastatin 80 MG tablet  Commonly known as:  LIPITOR  Take 40 mg by mouth daily.     furosemide 40 MG tablet  Commonly known as:  LASIX  Take 40 mg by mouth daily.     loratadine 10 MG tablet  Commonly known as:  CLARITIN  OTC Take by mouth one a day for 10 days, then once a day as needed -allergies.     metFORMIN 500 MG tablet  Commonly known as:  GLUCOPHAGE  Take 500 mg by mouth daily with breakfast.     potassium chloride 10 MEQ tablet  Commonly known as:  K-DUR,KLOR-CON  Take 10 mEq by mouth daily.     raloxifene 60 MG tablet  Commonly known as:  EVISTA  Take  60 mg by mouth daily.     tiZANidine 4 MG capsule  Commonly known as:  ZANAFLEX  Take 4 mg by mouth at bedtime.     Vitamin D (Ergocalciferol) 50000 units Caps capsule  Commonly known as:  DRISDOL  TAKE ONE CAPSULE BY MOUTH 3 DAYS WEEK WITH A MEAL FOR LOW VITAMIN D     ergocalciferol 50000 units capsule  Commonly known as:  VITAMIN D2  Take 1 capsule (50,000 Units total) by mouth once a week. 3 month supply        Allergies: No Known Allergies  Past Medical History, Surgical history, Social history, and Family History were reviewed and updated.  Review of Systems: All other 10 point review of systems is negative.   Physical Exam:  vitals were not taken for this visit.  Wt Readings from Last 3 Encounters:  12/08/14 172 lb (78.019 kg)  08/07/14 177 lb (80.287 kg)  05/07/14 178 lb (80.74 kg)    Ocular: Sclerae unicteric, pupils equal, round and reactive to light Ear-nose-throat: Oropharynx clear, dentition fair Lymphatic: No cervical supraclavicular or axillary adenopathy Lungs no rales or rhonchi, good excursion bilaterally Heart regular  rate and rhythm, no murmur appreciated Abd soft, nontender, positive bowel sounds, no liver or spleen tip palpated on exam MSK no focal spinal tenderness, no joint edema Neuro: non-focal, well-oriented, appropriate affect Breasts: Deferred  Lab Results  Component Value Date   WBC 5.1 04/10/2015   HGB 13.1 04/10/2015   HCT 40.6 04/10/2015   MCV 81 04/10/2015   PLT 135* 04/10/2015   Lab Results  Component Value Date   FERRITIN 234 09/16/2011   IRON 72 09/16/2011   TIBC 528* 09/16/2011   UIBC 456* 09/16/2011   IRONPCTSAT 14* 09/16/2011   Lab Results  Component Value Date   RETICCTPCT 1.2 11/13/2013   RBC 5.01 04/10/2015   RETICCTABS 59.2 11/13/2013   No results found for: Nils Pyle [facility] Lab Results  Component Value Date   IGGSERUM 557* 06/19/2013   IGA 100 06/19/2013   IGMSERUM 95 06/19/2013    Lab Results  Component Value Date   TOTALPROTELP 6.3 09/16/2011   ALBUMINELP 59.9 09/16/2011   A1GS 7.3* 09/16/2011   A2GS 9.9 09/16/2011   BETS 8.4* 09/16/2011   BETA2SER 4.8 09/16/2011   GAMS 9.7* 09/16/2011   MSPIKE NOT DET 09/16/2011   SPEI * 09/16/2011     Chemistry      Component Value Date/Time   NA 137 12/08/2014 1138   NA 131* 11/06/2012 2050   NA 139 05/28/2012 1036   K 3.3 12/08/2014 1138   K 3.0* 11/06/2012 2050   K 3.7 05/28/2012 1036   CL 101 12/08/2014 1138   CL 92* 11/06/2012 2050   CL 99 05/28/2012 1036   CO2 30 12/08/2014 1138   CO2 26 11/06/2012 2050   CO2 31* 05/28/2012 1036   BUN 12 12/08/2014 1138   BUN 10 11/06/2012 2050   BUN 12.3 05/28/2012 1036   CREATININE 0.6 12/08/2014 1138   CREATININE 0.70 11/06/2012 2050   CREATININE 0.7 05/28/2012 1036      Component Value Date/Time   CALCIUM 9.0 12/08/2014 1138   CALCIUM 8.7 11/06/2012 2050   CALCIUM 9.3 05/28/2012 1036   ALKPHOS 140* 12/08/2014 1138   ALKPHOS 103 11/06/2012 2050   AST 32 12/08/2014 1138   AST 47* 11/06/2012 2050   ALT 26 12/08/2014 1138   ALT 46* 11/06/2012 2050   BILITOT 0.80 12/08/2014 1138   BILITOT 1.5* 11/06/2012 2050     Impression and Plan: Ms. Amsler is an 82 year old African American female with a history of marginal zone lymphoma. She was treated with systemic chemotherapy for this and has remained in remission. There has been no evidence of recurrence. She finishes 2 years of maintenance Rituxan in June 2016 and so far has done well. There has been no evidence of recurrence. CBC today looks good. No anemia, WBC count is normal.  We will flush her port a cath every 8 weeks.  We will plan to see her back in 6 months for labs and follow-up.  She knows to call with any questions or concerns. We can certainly see her sooner if need be.   Eliezer Bottom, NP 2/17/20178:39 AM

## 2015-06-05 ENCOUNTER — Ambulatory Visit (HOSPITAL_BASED_OUTPATIENT_CLINIC_OR_DEPARTMENT_OTHER): Payer: Medicare Other

## 2015-06-05 VITALS — BP 131/72 | HR 72 | Temp 98.1°F | Resp 18

## 2015-06-05 DIAGNOSIS — Z8579 Personal history of other malignant neoplasms of lymphoid, hematopoietic and related tissues: Secondary | ICD-10-CM

## 2015-06-05 DIAGNOSIS — Z452 Encounter for adjustment and management of vascular access device: Secondary | ICD-10-CM | POA: Diagnosis not present

## 2015-06-05 DIAGNOSIS — C801 Malignant (primary) neoplasm, unspecified: Secondary | ICD-10-CM

## 2015-06-05 MED ORDER — SODIUM CHLORIDE 0.9% FLUSH
10.0000 mL | INTRAVENOUS | Status: DC | PRN
  Administered 2015-06-05: 10 mL via INTRAVENOUS
  Filled 2015-06-05: qty 10

## 2015-06-05 MED ORDER — HEPARIN SOD (PORK) LOCK FLUSH 100 UNIT/ML IV SOLN
500.0000 [IU] | Freq: Once | INTRAVENOUS | Status: AC
  Administered 2015-06-05: 500 [IU] via INTRAVENOUS
  Filled 2015-06-05: qty 5

## 2015-06-05 NOTE — Patient Instructions (Signed)

## 2015-06-18 DIAGNOSIS — J440 Chronic obstructive pulmonary disease with acute lower respiratory infection: Secondary | ICD-10-CM

## 2015-06-18 DIAGNOSIS — J9600 Acute respiratory failure, unspecified whether with hypoxia or hypercapnia: Secondary | ICD-10-CM | POA: Insufficient documentation

## 2015-06-18 DIAGNOSIS — R0902 Hypoxemia: Secondary | ICD-10-CM | POA: Insufficient documentation

## 2015-06-18 DIAGNOSIS — J209 Acute bronchitis, unspecified: Secondary | ICD-10-CM | POA: Insufficient documentation

## 2015-06-18 DIAGNOSIS — I272 Pulmonary hypertension, unspecified: Secondary | ICD-10-CM | POA: Insufficient documentation

## 2015-06-18 DIAGNOSIS — I5031 Acute diastolic (congestive) heart failure: Secondary | ICD-10-CM | POA: Insufficient documentation

## 2015-06-30 DIAGNOSIS — J449 Chronic obstructive pulmonary disease, unspecified: Secondary | ICD-10-CM | POA: Insufficient documentation

## 2015-07-27 ENCOUNTER — Ambulatory Visit (HOSPITAL_BASED_OUTPATIENT_CLINIC_OR_DEPARTMENT_OTHER): Payer: Medicare Other

## 2015-07-27 VITALS — BP 159/63 | HR 66 | Temp 98.0°F | Resp 18

## 2015-07-27 DIAGNOSIS — Z452 Encounter for adjustment and management of vascular access device: Secondary | ICD-10-CM

## 2015-07-27 DIAGNOSIS — C8307 Small cell B-cell lymphoma, spleen: Secondary | ICD-10-CM

## 2015-07-27 MED ORDER — HEPARIN SOD (PORK) LOCK FLUSH 100 UNIT/ML IV SOLN
500.0000 [IU] | Freq: Once | INTRAVENOUS | Status: AC
  Administered 2015-07-27: 500 [IU] via INTRAVENOUS
  Filled 2015-07-27: qty 5

## 2015-07-27 MED ORDER — SODIUM CHLORIDE 0.9% FLUSH
10.0000 mL | INTRAVENOUS | Status: DC | PRN
  Administered 2015-07-27: 10 mL via INTRAVENOUS
  Filled 2015-07-27: qty 10

## 2015-07-27 NOTE — Patient Instructions (Signed)

## 2015-08-15 DIAGNOSIS — Z961 Presence of intraocular lens: Secondary | ICD-10-CM | POA: Insufficient documentation

## 2015-08-15 DIAGNOSIS — H401122 Primary open-angle glaucoma, left eye, moderate stage: Secondary | ICD-10-CM | POA: Insufficient documentation

## 2015-08-15 DIAGNOSIS — H401111 Primary open-angle glaucoma, right eye, mild stage: Secondary | ICD-10-CM | POA: Insufficient documentation

## 2015-08-15 DIAGNOSIS — H52203 Unspecified astigmatism, bilateral: Secondary | ICD-10-CM | POA: Insufficient documentation

## 2015-08-15 DIAGNOSIS — M3501 Sicca syndrome with keratoconjunctivitis: Secondary | ICD-10-CM | POA: Insufficient documentation

## 2015-08-17 DIAGNOSIS — I5032 Chronic diastolic (congestive) heart failure: Secondary | ICD-10-CM | POA: Insufficient documentation

## 2015-08-25 DIAGNOSIS — Z780 Asymptomatic menopausal state: Secondary | ICD-10-CM | POA: Insufficient documentation

## 2015-09-09 DIAGNOSIS — H52223 Regular astigmatism, bilateral: Secondary | ICD-10-CM | POA: Insufficient documentation

## 2015-09-09 DIAGNOSIS — H16223 Keratoconjunctivitis sicca, not specified as Sjogren's, bilateral: Secondary | ICD-10-CM | POA: Insufficient documentation

## 2015-09-09 DIAGNOSIS — H02834 Dermatochalasis of left upper eyelid: Secondary | ICD-10-CM | POA: Insufficient documentation

## 2015-09-09 DIAGNOSIS — H401111 Primary open-angle glaucoma, right eye, mild stage: Secondary | ICD-10-CM | POA: Insufficient documentation

## 2015-09-09 DIAGNOSIS — H401122 Primary open-angle glaucoma, left eye, moderate stage: Secondary | ICD-10-CM | POA: Insufficient documentation

## 2015-09-09 DIAGNOSIS — H02831 Dermatochalasis of right upper eyelid: Secondary | ICD-10-CM | POA: Insufficient documentation

## 2015-09-09 DIAGNOSIS — H43813 Vitreous degeneration, bilateral: Secondary | ICD-10-CM | POA: Insufficient documentation

## 2015-09-09 DIAGNOSIS — Z961 Presence of intraocular lens: Secondary | ICD-10-CM | POA: Insufficient documentation

## 2015-09-25 ENCOUNTER — Ambulatory Visit (HOSPITAL_BASED_OUTPATIENT_CLINIC_OR_DEPARTMENT_OTHER): Payer: Medicare Other | Admitting: Family

## 2015-09-25 ENCOUNTER — Other Ambulatory Visit (HOSPITAL_BASED_OUTPATIENT_CLINIC_OR_DEPARTMENT_OTHER): Payer: Medicare Other

## 2015-09-25 ENCOUNTER — Ambulatory Visit (HOSPITAL_BASED_OUTPATIENT_CLINIC_OR_DEPARTMENT_OTHER): Payer: Medicare Other

## 2015-09-25 VITALS — BP 155/75 | HR 69 | Temp 97.4°F | Resp 20 | Wt 179.8 lb

## 2015-09-25 DIAGNOSIS — G629 Polyneuropathy, unspecified: Secondary | ICD-10-CM

## 2015-09-25 DIAGNOSIS — Z8579 Personal history of other malignant neoplasms of lymphoid, hematopoietic and related tissues: Secondary | ICD-10-CM | POA: Diagnosis not present

## 2015-09-25 DIAGNOSIS — C8307 Small cell B-cell lymphoma, spleen: Secondary | ICD-10-CM

## 2015-09-25 DIAGNOSIS — C8580 Other specified types of non-Hodgkin lymphoma, unspecified site: Secondary | ICD-10-CM

## 2015-09-25 LAB — CBC WITH DIFFERENTIAL (CANCER CENTER ONLY)
BASO#: 0 10*3/uL (ref 0.0–0.2)
BASO%: 0.5 % (ref 0.0–2.0)
EOS ABS: 0.1 10*3/uL (ref 0.0–0.5)
EOS%: 1.6 % (ref 0.0–7.0)
HEMATOCRIT: 37.6 % (ref 34.8–46.6)
HEMOGLOBIN: 12.2 g/dL (ref 11.6–15.9)
LYMPH#: 0.6 10*3/uL — AB (ref 0.9–3.3)
LYMPH%: 13.2 % — ABNORMAL LOW (ref 14.0–48.0)
MCH: 26.8 pg (ref 26.0–34.0)
MCHC: 32.4 g/dL (ref 32.0–36.0)
MCV: 83 fL (ref 81–101)
MONO#: 0.4 10*3/uL (ref 0.1–0.9)
MONO%: 8.6 % (ref 0.0–13.0)
NEUT%: 76.1 % (ref 39.6–80.0)
NEUTROS ABS: 3.3 10*3/uL (ref 1.5–6.5)
Platelets: 138 10*3/uL — ABNORMAL LOW (ref 145–400)
RBC: 4.56 10*6/uL (ref 3.70–5.32)
RDW: 14.5 % (ref 11.1–15.7)
WBC: 4.3 10*3/uL (ref 3.9–10.0)

## 2015-09-25 LAB — COMPREHENSIVE METABOLIC PANEL
ALBUMIN: 3.4 g/dL — AB (ref 3.5–5.0)
ALK PHOS: 120 U/L (ref 40–150)
ALT: 17 U/L (ref 0–55)
ANION GAP: 8 meq/L (ref 3–11)
AST: 27 U/L (ref 5–34)
BUN: 10.7 mg/dL (ref 7.0–26.0)
CALCIUM: 9.1 mg/dL (ref 8.4–10.4)
CO2: 25 mEq/L (ref 22–29)
Chloride: 106 mEq/L (ref 98–109)
Creatinine: 0.7 mg/dL (ref 0.6–1.1)
EGFR: 89 mL/min/{1.73_m2} — AB (ref >90)
Glucose: 107 mg/dl (ref 70–140)
POTASSIUM: 4.3 meq/L (ref 3.5–5.1)
Sodium: 139 mEq/L (ref 136–145)
Total Bilirubin: 0.53 mg/dL (ref 0.20–1.20)
Total Protein: 6 g/dL — ABNORMAL LOW (ref 6.4–8.3)

## 2015-09-25 LAB — LACTATE DEHYDROGENASE: LDH: 296 U/L — AB (ref 125–245)

## 2015-09-25 MED ORDER — HEPARIN SOD (PORK) LOCK FLUSH 100 UNIT/ML IV SOLN
500.0000 [IU] | Freq: Once | INTRAVENOUS | Status: AC
  Administered 2015-09-25: 500 [IU] via INTRAVENOUS
  Filled 2015-09-25: qty 5

## 2015-09-25 MED ORDER — GABAPENTIN 300 MG PO CAPS: 300.0000 mg | ORAL_CAPSULE | Freq: Every day | ORAL | 2 refills | Status: DC

## 2015-09-25 MED ORDER — SODIUM CHLORIDE 0.9% FLUSH
10.0000 mL | INTRAVENOUS | Status: DC | PRN
  Administered 2015-09-25: 10 mL via INTRAVENOUS
  Filled 2015-09-25: qty 10

## 2015-09-25 NOTE — Patient Instructions (Signed)

## 2015-09-25 NOTE — Progress Notes (Signed)
Hematology and Oncology Follow Up Visit  Kristine Sullivan [id number] 1931-04-23 82 y.o. 09/25/2015   Principle Diagnosis:  Marginal zone lymphoma  Current Therapy:   Maintenance Rituxan - 2 years completed in June 2016    Interim History:  Kristine Sullivan is here today for follow-up and is doing quite well. 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 She is also trying to exercise and lose weight. She has stopped eating bread and is maintained a healthy diet. She is staying well hydrated. Her weight is stable.  No fever, chills, n/v, cough, rash, dizziness, chest pain, palpitations, abdominal pain or changes in bowel or bladder habits.  She has occasional SOB with exertion and uses 2L [place] supplemental O2 at night.  No lymphadenopathy found on assessment. She has had no episodes of bleeding or bruising.  No swelling or tenderness in her extremities. She has neuropathy in her feet, legs and finger tips that bothers her. She asked if there is something she could take to help with this. She has tried a B complex vitamin but could not tell any difference.  Thankfully, this has not effected her gait or dexterity. No c/o joint aches or "bone" pain.   Medications:    Medication List       Accurate as of 09/25/15  9:11 AM. Always use your most recent med list.          aspirin 81 MG tablet Take 81 mg by mouth daily.   atorvastatin 80 MG tablet Commonly known as:  LIPITOR Take 40 mg by mouth daily.   CALCIUM 600-D 600-400 MG-UNIT Tabs Generic drug:  Calcium Carbonate-Vitamin D3 Take by mouth.   ferrous sulfate 325 (65 FE) MG tablet Take 325 mg by mouth.   furosemide 40 MG tablet Commonly known as:  LASIX Take 40 mg by mouth daily.   latanoprost 0.005 % ophthalmic solution Commonly known as:  XALATAN   loratadine 10 MG tablet Commonly known as:  CLARITIN OTC Take by mouth one a day for 10 days, then once a day as needed -allergies.   metFORMIN 500 MG tablet Commonly  known as:  GLUCOPHAGE Take 500 mg by mouth daily with breakfast.   potassium chloride 10 MEQ tablet Commonly known as:  K-DUR,KLOR-CON Take 10 mEq by mouth daily.   raloxifene 60 MG tablet Commonly known as:  EVISTA Take 60 mg by mouth daily.   tiZANidine 4 MG capsule Commonly known as:  ZANAFLEX Take 4 mg by mouth at bedtime.   Vitamin D (Ergocalciferol) 50000 units Caps capsule Commonly known as:  DRISDOL TAKE ONE CAPSULE BY MOUTH 3 DAYS WEEK WITH A MEAL FOR LOW VITAMIN D   ergocalciferol 50000 units capsule Commonly known as:  VITAMIN D2 Take 1 capsule (50,000 Units total) by mouth once a week. 3 month supply       Allergies: No Known Allergies  Past Medical History, Surgical history, Social history, and Family History were reviewed and updated.  Review of Systems: All other 10 point review of systems is negative.   Physical Exam:  weight is 179 lb 12.8 oz (81.6 kg). Her oral temperature is 97.4 F (36.3 C). Her blood pressure is 155/75 (abnormal) and her pulse is 69. Her respiration is 20 and oxygen saturation is 99%.   Wt Readings from Last 3 Encounters:  09/25/15 179 lb 12.8 oz (81.6 kg)  04/10/15 175 lb (79.4 kg)  12/08/14 172 lb (78 kg)    Ocular: Sclerae unicteric,  pupils equal, round and reactive to light Ear-nose-throat: Oropharynx clear, dentition fair Lymphatic: No cervical supraclavicular or axillary adenopathy Lungs no rales or rhonchi, good excursion bilaterally Heart regular rate and rhythm, no murmur appreciated Abd soft, nontender, positive bowel sounds, no liver or spleen tip palpated on exam MSK no focal spinal tenderness, no joint edema Neuro: non-focal, well-oriented, appropriate affect Breasts: Deferred  Lab Results  Component Value Date   WBC 4.3 09/25/2015   HGB 12.2 09/25/2015   HCT 37.6 09/25/2015   MCV 83 09/25/2015   PLT 138 (L) 09/25/2015   Lab Results  Component Value Date   FERRITIN 234 09/16/2011   IRON 72 09/16/2011    TIBC 528 (H) 09/16/2011   UIBC 456 (H) 09/16/2011   IRONPCTSAT 14 (L) 09/16/2011   Lab Results  Component Value Date   RETICCTPCT 1.2 11/13/2013   RBC 4.56 09/25/2015   RETICCTABS 59.2 11/13/2013   No results found for: Nils Pyle, [facility] Lab Results  Component Value Date   IGGSERUM 557 (L) 06/19/2013   IGA 100 06/19/2013   IGMSERUM 95 06/19/2013   Lab Results  Component Value Date   TOTALPROTELP 6.3 09/16/2011   ALBUMINELP 59.9 09/16/2011   A1GS 7.3 (H) 09/16/2011   A2GS 9.9 09/16/2011   BETS 8.4 (H) 09/16/2011   BETA2SER 4.8 09/16/2011   GAMS 9.7 (L) 09/16/2011   MSPIKE NOT DET 09/16/2011   SPEI * 09/16/2011     Chemistry      Component Value Date/Time   NA 141 04/10/2015 0827   K 4.3 04/10/2015 0827   CL 101 12/08/2014 1138   CL 99 05/28/2012 1036   CO2 27 04/10/2015 0827   BUN 15.1 04/10/2015 0827   CREATININE 0.8 04/10/2015 0827      Component Value Date/Time   CALCIUM 8.9 04/10/2015 0827   ALKPHOS 144 04/10/2015 0827   AST 21 04/10/2015 0827   ALT 18 04/10/2015 0827   BILITOT 0.48 04/10/2015 0827     Impression and Plan: Kristine Sullivan is a very pleasant 82 yo African American female with a history of marginal zone lymphoma. She was treated with systemic chemotherapy for this and has remained in remission. She continues to do well but has had some recent issues with neuropathy in her feet and legs. We will have her try taking Neurontin 300 mg QHS and see if this gives her any relief. She will let us know if this progresses and is the Neurontin is not effective.  We will continue to flush her port a cath every 8 weeks.  We will plan to see her back in 6 months for repeat labs and follow-up.  She knows to call with any questions or concerns. We can certainly see her sooner if need be.   Eliezer Bottom, NP 8/4/20179:11 AM

## 2015-11-09 ENCOUNTER — Other Ambulatory Visit: Payer: Self-pay | Admitting: Nurse Practitioner

## 2015-11-09 DIAGNOSIS — C8307 Small cell B-cell lymphoma, spleen: Secondary | ICD-10-CM

## 2015-11-09 DIAGNOSIS — G629 Polyneuropathy, unspecified: Secondary | ICD-10-CM

## 2015-11-09 MED ORDER — GABAPENTIN 300 MG PO CAPS: 300.0000 mg | ORAL_CAPSULE | Freq: Every day | ORAL | 3 refills | Status: DC

## 2015-11-16 ENCOUNTER — Other Ambulatory Visit: Payer: Self-pay | Admitting: *Deleted

## 2015-11-16 DIAGNOSIS — G629 Polyneuropathy, unspecified: Secondary | ICD-10-CM

## 2015-11-16 DIAGNOSIS — C8307 Small cell B-cell lymphoma, spleen: Secondary | ICD-10-CM

## 2015-11-16 MED ORDER — GABAPENTIN 300 MG PO CAPS: 300.0000 mg | ORAL_CAPSULE | Freq: Every day | ORAL | 3 refills | Status: DC

## 2015-11-18 ENCOUNTER — Other Ambulatory Visit: Payer: Self-pay | Admitting: *Deleted

## 2015-11-18 DIAGNOSIS — G629 Polyneuropathy, unspecified: Secondary | ICD-10-CM

## 2015-11-18 DIAGNOSIS — C8307 Small cell B-cell lymphoma, spleen: Secondary | ICD-10-CM

## 2015-11-18 MED ORDER — GABAPENTIN 300 MG PO CAPS: 300.0000 mg | ORAL_CAPSULE | Freq: Every day | ORAL | 3 refills | Status: DC

## 2015-11-20 ENCOUNTER — Ambulatory Visit (HOSPITAL_BASED_OUTPATIENT_CLINIC_OR_DEPARTMENT_OTHER): Payer: Medicare Other

## 2015-11-20 ENCOUNTER — Other Ambulatory Visit: Payer: Self-pay | Admitting: *Deleted

## 2015-11-20 VITALS — BP 145/65 | HR 65 | Temp 97.5°F | Resp 18

## 2015-11-20 DIAGNOSIS — Z452 Encounter for adjustment and management of vascular access device: Secondary | ICD-10-CM | POA: Diagnosis not present

## 2015-11-20 DIAGNOSIS — Z8579 Personal history of other malignant neoplasms of lymphoid, hematopoietic and related tissues: Secondary | ICD-10-CM | POA: Diagnosis not present

## 2015-11-20 DIAGNOSIS — C8580 Other specified types of non-Hodgkin lymphoma, unspecified site: Secondary | ICD-10-CM

## 2015-11-20 MED ORDER — LIDOCAINE-PRILOCAINE 2.5-2.5 % EX CREA: 1.0000 "application " | TOPICAL_CREAM | CUTANEOUS | 0 refills | Status: DC | PRN

## 2015-11-20 MED ORDER — SODIUM CHLORIDE 0.9% FLUSH
10.0000 mL | INTRAVENOUS | Status: DC | PRN
Start: 2015-11-20 — End: 2015-11-20
  Administered 2015-11-20: 10 mL via INTRAVENOUS
  Filled 2015-11-20: qty 10

## 2015-11-20 MED ORDER — HEPARIN SOD (PORK) LOCK FLUSH 100 UNIT/ML IV SOLN
500.0000 [IU] | Freq: Once | INTRAVENOUS | Status: AC
  Administered 2015-11-20: 500 [IU] via INTRAVENOUS
  Filled 2015-11-20: qty 5

## 2015-11-20 NOTE — Progress Notes (Signed)
Cathleen Corti presented for Portacath access and flush. Proper placement of portacath confirmed by CXR. Portacath located in the right chest wall accessed with  H 20 needle. Clean, Dry and Intact Good blood return present. Portacath flushed with 43ml NS and 500U/48ml Heparin per protocol and needle removed intact. Procedure without incident. Patient tolerated procedure well.

## 2015-11-20 NOTE — Patient Instructions (Signed)

## 2015-11-27 DIAGNOSIS — R29898 Other symptoms and signs involving the musculoskeletal system: Secondary | ICD-10-CM | POA: Insufficient documentation

## 2015-11-27 DIAGNOSIS — S99921A Unspecified injury of right foot, initial encounter: Secondary | ICD-10-CM | POA: Insufficient documentation

## 2015-12-21 DIAGNOSIS — Z9981 Dependence on supplemental oxygen: Secondary | ICD-10-CM | POA: Insufficient documentation

## 2016-01-15 ENCOUNTER — Ambulatory Visit (HOSPITAL_BASED_OUTPATIENT_CLINIC_OR_DEPARTMENT_OTHER): Payer: Medicare Other

## 2016-01-15 VITALS — BP 160/65 | HR 67 | Temp 98.1°F | Resp 18

## 2016-01-15 DIAGNOSIS — Z452 Encounter for adjustment and management of vascular access device: Secondary | ICD-10-CM | POA: Diagnosis not present

## 2016-01-15 DIAGNOSIS — Z8579 Personal history of other malignant neoplasms of lymphoid, hematopoietic and related tissues: Secondary | ICD-10-CM

## 2016-01-15 DIAGNOSIS — C8307 Small cell B-cell lymphoma, spleen: Secondary | ICD-10-CM

## 2016-01-15 DIAGNOSIS — C8580 Other specified types of non-Hodgkin lymphoma, unspecified site: Secondary | ICD-10-CM

## 2016-01-15 MED ORDER — HEPARIN SOD (PORK) LOCK FLUSH 100 UNIT/ML IV SOLN
500.0000 [IU] | Freq: Once | INTRAVENOUS | Status: AC
  Administered 2016-01-15: 500 [IU] via INTRAVENOUS
  Filled 2016-01-15: qty 5

## 2016-01-15 MED ORDER — SODIUM CHLORIDE 0.9% FLUSH
10.0000 mL | INTRAVENOUS | Status: DC | PRN
  Administered 2016-01-15: 10 mL via INTRAVENOUS
  Filled 2016-01-15: qty 10

## 2016-01-15 NOTE — Patient Instructions (Signed)

## 2016-04-01 ENCOUNTER — Ambulatory Visit (HOSPITAL_BASED_OUTPATIENT_CLINIC_OR_DEPARTMENT_OTHER): Payer: Medicare Other | Admitting: Family

## 2016-04-01 ENCOUNTER — Other Ambulatory Visit (HOSPITAL_BASED_OUTPATIENT_CLINIC_OR_DEPARTMENT_OTHER): Payer: Medicare Other

## 2016-04-01 ENCOUNTER — Ambulatory Visit: Payer: Medicare Other

## 2016-04-01 DIAGNOSIS — D599 Acquired hemolytic anemia, unspecified: Secondary | ICD-10-CM

## 2016-04-01 DIAGNOSIS — Z8572 Personal history of non-Hodgkin lymphomas: Secondary | ICD-10-CM | POA: Diagnosis not present

## 2016-04-01 DIAGNOSIS — G629 Polyneuropathy, unspecified: Secondary | ICD-10-CM

## 2016-04-01 DIAGNOSIS — C8307 Small cell B-cell lymphoma, spleen: Secondary | ICD-10-CM

## 2016-04-01 DIAGNOSIS — D591 Other autoimmune hemolytic anemias: Secondary | ICD-10-CM | POA: Diagnosis not present

## 2016-04-01 LAB — COMPREHENSIVE METABOLIC PANEL
ALT: 16 U/L (ref 0–55)
ANION GAP: 9 meq/L (ref 3–11)
AST: 26 U/L (ref 5–34)
Albumin: 3.5 g/dL (ref 3.5–5.0)
Alkaline Phosphatase: 138 U/L (ref 40–150)
BUN: 15.2 mg/dL (ref 7.0–26.0)
CALCIUM: 8.8 mg/dL (ref 8.4–10.4)
CHLORIDE: 104 meq/L (ref 98–109)
CO2: 27 meq/L (ref 22–29)
CREATININE: 0.7 mg/dL (ref 0.6–1.1)
EGFR: 90 mL/min/{1.73_m2} — ABNORMAL LOW (ref >90)
Glucose: 122 mg/dl (ref 70–140)
Potassium: 3.6 mEq/L (ref 3.5–5.1)
Sodium: 140 mEq/L (ref 136–145)
Total Bilirubin: 0.57 mg/dL (ref 0.20–1.20)
Total Protein: 6.1 g/dL — ABNORMAL LOW (ref 6.4–8.3)

## 2016-04-01 LAB — CBC WITH DIFFERENTIAL (CANCER CENTER ONLY)
BASO#: 0 10*3/uL (ref 0.0–0.2)
BASO%: 0.3 % (ref 0.0–2.0)
EOS%: 4.3 % (ref 0.0–7.0)
Eosinophils Absolute: 0.2 10*3/uL (ref 0.0–0.5)
HEMATOCRIT: 38.9 % (ref 34.8–46.6)
HGB: 12.3 g/dL (ref 11.6–15.9)
LYMPH#: 0.5 10*3/uL — AB (ref 0.9–3.3)
LYMPH%: 12.3 % — AB (ref 14.0–48.0)
MCH: 25.7 pg — AB (ref 26.0–34.0)
MCHC: 31.6 g/dL — AB (ref 32.0–36.0)
MCV: 81 fL (ref 81–101)
MONO#: 0.3 10*3/uL (ref 0.1–0.9)
MONO%: 7.3 % (ref 0.0–13.0)
NEUT#: 3 10*3/uL (ref 1.5–6.5)
NEUT%: 75.8 % (ref 39.6–80.0)
PLATELETS: 141 10*3/uL — AB (ref 145–400)
RBC: 4.78 10*6/uL (ref 3.70–5.32)
RDW: 14.9 % (ref 11.1–15.7)
WBC: 4 10*3/uL (ref 3.9–10.0)

## 2016-04-01 LAB — LACTATE DEHYDROGENASE: LDH: 232 U/L (ref 125–245)

## 2016-04-01 NOTE — Progress Notes (Signed)
Hematology and Oncology Follow Up Visit  Parisa Mcgown [id number] 06-Dec-1931 83 y.o. 04/01/2016   Principle Diagnosis:  Marginal zone lymphoma  Current Therapy:   Maintenance Rituxan - 2 years completed in June 2016    Interim History:  Ms. Peavler is here today for follow-up. She continues to do well and her CBC counts today are stable.  She has had no problem with frequent infections. No fever, chills, n/v, cough, rash, dizziness, chest pain, palpitations, abdominal pain or changes in bowel or bladder habits.  She has occasional SOB with exertion and will take time to rest until this resolves as needed. She uses 2L [place] supplemental O2 at night and is resting nicely.  No lymphadenopathy found on assessment. No episodes of bleeding or bruising.  The neuropathy in her feet and legs has improved on Neurontin. She still has some numbness and tingling in her fingers that is unchanged. No swelling or tenderness in her extremities at this time.  She has maintained a good appetite and is staying well hydrated. Her weight is stable.  Her blood sugars have been well controlled on Metformin.  She states that she is due for her annual mammogram and will schedule this appointment herself.   Medications:  Allergies as of 04/01/2016   No Known Allergies     Medication List       Accurate as of 04/01/16  9:07 AM. Always use your most recent med list.          apixaban 5 MG Tabs tablet Commonly known as:  ELIQUIS Take 5 mg by mouth.   aspirin 81 MG tablet Take 81 mg by mouth daily.   Aspirin-Calcium Carbonate 81-777 MG Tabs Take by mouth.   atorvastatin 80 MG tablet Commonly known as:  LIPITOR Take 40 mg by mouth daily.   CALCIUM 600-D 600-400 MG-UNIT Tabs Generic drug:  Calcium Carbonate-Vitamin D3 Take by mouth.   diphenhydrAMINE 25 MG tablet Commonly known as:  BENADRYL Take 25 mg by mouth.   ferrous sulfate 325 (65 FE) MG tablet Take 325 mg by mouth.   ferrous sulfate 325  (65 FE) MG tablet Take 325 mg by mouth daily.   furosemide 40 MG tablet Commonly known as:  LASIX Take 40 mg by mouth daily.   gabapentin 300 MG capsule Commonly known as:  NEURONTIN Take 1 capsule (300 mg total) by mouth at bedtime. 90 day supply   GLUCOCOM BLOOD GLUCOSE MONITOR Devi USE TO CHECK BLOOD SUGAR EVERY MORNING FASTING - E11.49   latanoprost 0.005 % ophthalmic solution Commonly known as:  XALATAN   lidocaine-prilocaine cream Commonly known as:  EMLA Apply 1 application topically as needed.   loratadine 10 MG tablet Commonly known as:  CLARITIN OTC Take by mouth one a day for 10 days, then once a day as needed -allergies.   metFORMIN 500 MG tablet Commonly known as:  GLUCOPHAGE Take 500 mg by mouth daily with breakfast.   potassium chloride 10 MEQ tablet Commonly known as:  K-DUR,KLOR-CON Take 10 mEq by mouth daily.   Propylene Glycol 0.6 % Soln 1 drop.   raloxifene 60 MG tablet Commonly known as:  EVISTA Take 60 mg by mouth daily.   ranitidine 150 MG tablet Commonly known as:  ZANTAC 1 tablet by mouth twice a day for month and then take twice a day for needed - GERD   tiZANidine 4 MG capsule Commonly known as:  ZANAFLEX Take 4 mg by mouth at bedtime.   Vitamin  D (Ergocalciferol) 50000 units Caps capsule Commonly known as:  DRISDOL TAKE ONE CAPSULE BY MOUTH 3 DAYS WEEK WITH A MEAL FOR LOW VITAMIN D   ergocalciferol 50000 units capsule Commonly known as:  VITAMIN D2 Take 1 capsule (50,000 Units total) by mouth once a week. 3 month supply       Allergies: No Known Allergies  Past Medical History, Surgical history, Social history, and Family History were reviewed and updated.  Review of Systems: All other 10 point review of systems is negative.   Physical Exam:  vitals were not taken for this visit.  Wt Readings from Last 3 Encounters:  04/01/16 179 lb (81.2 kg)  09/25/15 179 lb 12.8 oz (81.6 kg)  04/10/15 175 lb (79.4 kg)    Ocular:  Sclerae unicteric, pupils equal, round and reactive to light Ear-nose-throat: Oropharynx clear, dentition fair Lymphatic: No cervical supraclavicular or axillary adenopathy Lungs no rales or rhonchi, good excursion bilaterally Heart regular rate and rhythm, no murmur appreciated Abd soft, nontender, positive bowel sounds, no liver or spleen tip palpated on exam MSK no focal spinal tenderness, no joint edema Neuro: non-focal, well-oriented, appropriate affect Breasts: Deferred  Lab Results  Component Value Date   WBC 4.3 09/25/2015   HGB 12.2 09/25/2015   HCT 37.6 09/25/2015   MCV 83 09/25/2015   PLT 138 (L) 09/25/2015   Lab Results  Component Value Date   FERRITIN 234 09/16/2011   IRON 72 09/16/2011   TIBC 528 (H) 09/16/2011   UIBC 456 (H) 09/16/2011   IRONPCTSAT 14 (L) 09/16/2011   Lab Results  Component Value Date   RETICCTPCT 1.2 11/13/2013   RBC 4.56 09/25/2015   RETICCTABS 59.2 11/13/2013   No results found for: Nils Pyle, [facility] Lab Results  Component Value Date   IGGSERUM 557 (L) 06/19/2013   IGA 100 06/19/2013   IGMSERUM 95 06/19/2013   Lab Results  Component Value Date   TOTALPROTELP 6.3 09/16/2011   ALBUMINELP 59.9 09/16/2011   A1GS 7.3 (H) 09/16/2011   A2GS 9.9 09/16/2011   BETS 8.4 (H) 09/16/2011   BETA2SER 4.8 09/16/2011   GAMS 9.7 (L) 09/16/2011   MSPIKE NOT DET 09/16/2011   SPEI * 09/16/2011     Chemistry      Component Value Date/Time   NA 139 09/25/2015 0838   K 4.3 09/25/2015 0838   CL 101 12/08/2014 1138   CL 99 05/28/2012 1036   CO2 25 09/25/2015 0838   BUN 10.7 09/25/2015 0838   CREATININE 0.7 09/25/2015 0838      Component Value Date/Time   CALCIUM 9.1 09/25/2015 0838   ALKPHOS 120 09/25/2015 0838   AST 27 09/25/2015 0838   ALT 17 09/25/2015 0838   BILITOT 0.53 09/25/2015 0838     Impression and Plan: Ms. Bensel is a very pleasant 83 yo African American female with a history of marginal zone lymphoma and  autoimmune hemolytic anemia. She was treated with systemic chemotherapy and has remained in remission. So far, there has been no evidence of recurrence.  She continues to do quite well and is asymptomatic at this time with no complaints.  CBC is stable, no anemia.  She still has her port a cath in place and will have this flushed every 8 weeks.  We will plan to see her back in 6 months for repeat lab work and follow-up.  She will contact our office with any questions or concerns. We can certainly see her sooner if need be.  Eliezer Bottom, NP 2/9/20189:07 AM

## 2016-04-01 NOTE — Patient Instructions (Signed)

## 2016-04-04 DIAGNOSIS — C8290 Follicular lymphoma, unspecified, unspecified site: Secondary | ICD-10-CM | POA: Insufficient documentation

## 2016-04-15 DIAGNOSIS — Z87898 Personal history of other specified conditions: Secondary | ICD-10-CM | POA: Insufficient documentation

## 2016-05-27 ENCOUNTER — Ambulatory Visit (HOSPITAL_BASED_OUTPATIENT_CLINIC_OR_DEPARTMENT_OTHER): Payer: Medicare Other

## 2016-05-27 VITALS — BP 166/69 | HR 73 | Temp 97.4°F | Resp 20

## 2016-05-27 DIAGNOSIS — Z452 Encounter for adjustment and management of vascular access device: Secondary | ICD-10-CM

## 2016-05-27 DIAGNOSIS — Z8572 Personal history of non-Hodgkin lymphomas: Secondary | ICD-10-CM

## 2016-05-27 DIAGNOSIS — Z95828 Presence of other vascular implants and grafts: Secondary | ICD-10-CM

## 2016-05-27 MED ORDER — HEPARIN SOD (PORK) LOCK FLUSH 100 UNIT/ML IV SOLN
500.0000 [IU] | Freq: Once | INTRAVENOUS | Status: AC
  Administered 2016-05-27: 500 [IU] via INTRAVENOUS
  Filled 2016-05-27: qty 5

## 2016-05-27 MED ORDER — SODIUM CHLORIDE 0.9% FLUSH
10.0000 mL | INTRAVENOUS | Status: DC | PRN
Start: 2016-05-27 — End: 2016-05-27
  Administered 2016-05-27: 10 mL via INTRAVENOUS
  Filled 2016-05-27: qty 10

## 2016-06-24 DIAGNOSIS — Z9181 History of falling: Secondary | ICD-10-CM | POA: Insufficient documentation

## 2016-07-22 ENCOUNTER — Ambulatory Visit (HOSPITAL_BASED_OUTPATIENT_CLINIC_OR_DEPARTMENT_OTHER): Payer: Medicare Other

## 2016-07-22 DIAGNOSIS — Z8572 Personal history of non-Hodgkin lymphomas: Secondary | ICD-10-CM

## 2016-07-22 DIAGNOSIS — Z452 Encounter for adjustment and management of vascular access device: Secondary | ICD-10-CM

## 2016-08-17 DIAGNOSIS — M818 Other osteoporosis without current pathological fracture: Secondary | ICD-10-CM | POA: Insufficient documentation

## 2016-09-14 ENCOUNTER — Other Ambulatory Visit: Payer: Self-pay | Admitting: Family

## 2016-09-16 ENCOUNTER — Ambulatory Visit (HOSPITAL_BASED_OUTPATIENT_CLINIC_OR_DEPARTMENT_OTHER): Payer: Medicare Other

## 2016-09-16 ENCOUNTER — Ambulatory Visit (HOSPITAL_BASED_OUTPATIENT_CLINIC_OR_DEPARTMENT_OTHER): Payer: Medicare Other | Admitting: Hematology & Oncology

## 2016-09-16 ENCOUNTER — Other Ambulatory Visit (HOSPITAL_BASED_OUTPATIENT_CLINIC_OR_DEPARTMENT_OTHER): Payer: Medicare Other

## 2016-09-16 VITALS — BP 149/65 | HR 73 | Temp 98.0°F | Resp 19 | Wt 170.0 lb

## 2016-09-16 DIAGNOSIS — Z95828 Presence of other vascular implants and grafts: Secondary | ICD-10-CM

## 2016-09-16 DIAGNOSIS — Z8572 Personal history of non-Hodgkin lymphomas: Secondary | ICD-10-CM

## 2016-09-16 DIAGNOSIS — D599 Acquired hemolytic anemia, unspecified: Secondary | ICD-10-CM

## 2016-09-16 DIAGNOSIS — C8307 Small cell B-cell lymphoma, spleen: Secondary | ICD-10-CM

## 2016-09-16 LAB — CBC WITH DIFFERENTIAL (CANCER CENTER ONLY)
BASO#: 0 10*3/uL (ref 0.0–0.2)
BASO%: 0.2 % (ref 0.0–2.0)
EOS ABS: 0.1 10*3/uL (ref 0.0–0.5)
EOS%: 2.5 % (ref 0.0–7.0)
HEMATOCRIT: 39.1 % (ref 34.8–46.6)
HGB: 12.7 g/dL (ref 11.6–15.9)
LYMPH#: 0.7 10*3/uL — AB (ref 0.9–3.3)
LYMPH%: 18.4 % (ref 14.0–48.0)
MCH: 26.1 pg (ref 26.0–34.0)
MCHC: 32.5 g/dL (ref 32.0–36.0)
MCV: 80 fL — AB (ref 81–101)
MONO#: 0.4 10*3/uL (ref 0.1–0.9)
MONO%: 10.9 % (ref 0.0–13.0)
NEUT#: 2.7 10*3/uL (ref 1.5–6.5)
NEUT%: 68 % (ref 39.6–80.0)
PLATELETS: 140 10*3/uL — AB (ref 145–400)
RBC: 4.87 10*6/uL (ref 3.70–5.32)
RDW: 14.6 % (ref 11.1–15.7)
WBC: 4 10*3/uL (ref 3.9–10.0)

## 2016-09-16 LAB — COMPREHENSIVE METABOLIC PANEL
ALT: 15 U/L (ref 0–55)
AST: 25 U/L (ref 5–34)
Albumin: 3.6 g/dL (ref 3.5–5.0)
Alkaline Phosphatase: 122 U/L (ref 40–150)
Anion Gap: 9 mEq/L (ref 3–11)
BILIRUBIN TOTAL: 0.9 mg/dL (ref 0.20–1.20)
BUN: 9 mg/dL (ref 7.0–26.0)
CALCIUM: 9 mg/dL (ref 8.4–10.4)
CHLORIDE: 104 meq/L (ref 98–109)
CO2: 26 meq/L (ref 22–29)
CREATININE: 0.7 mg/dL (ref 0.6–1.1)
EGFR: 90 mL/min/{1.73_m2} — AB (ref >90)
Glucose: 109 mg/dl (ref 70–140)
Potassium: 3.2 mEq/L — ABNORMAL LOW (ref 3.5–5.1)
Sodium: 140 mEq/L (ref 136–145)
Total Protein: 6.3 g/dL — ABNORMAL LOW (ref 6.4–8.3)

## 2016-09-16 LAB — LACTATE DEHYDROGENASE: LDH: 250 U/L — AB (ref 125–245)

## 2016-09-16 MED ORDER — HEPARIN SOD (PORK) LOCK FLUSH 100 UNIT/ML IV SOLN
500.0000 [IU] | Freq: Once | INTRAVENOUS | Status: AC
  Administered 2016-09-16: 500 [IU] via INTRAVENOUS
  Filled 2016-09-16: qty 5

## 2016-09-16 MED ORDER — SODIUM CHLORIDE 0.9% FLUSH
10.0000 mL | INTRAVENOUS | Status: DC | PRN
  Administered 2016-09-16: 10 mL via INTRAVENOUS
  Filled 2016-09-16: qty 10

## 2016-09-16 NOTE — Progress Notes (Signed)
Hematology and Oncology Follow Up Visit  Kristine Sullivan [id number] 1931/11/01 83 y.o. 09/16/2016   Principle Diagnosis:  Marginal zone lymphoma  Current Therapy:  CVP I think completed 8 cycles on 04/09/2012  Maintenance Rituxan - 2 years completed in June 2016    Interim History:  Kristine Sullivan is here today for follow-up. She continues to do well and her CBC counts today are stable.  She has had no problem with frequent infections. No fever, chills, n/v, cough, rash, dizziness, chest pain, palpitations, abdominal pain or changes in bowel or bladder habits.  She has occasional SOB with exertion and will take time to rest until this resolves as needed. She uses 2L [place] supplemental O2 at night and is resting nicely.  No lymphadenopathy found on assessment. No episodes of bleeding or bruising.  The neuropathy in her feet and legs has improved on Neurontin. She still has some numbness and tingling in her fingers that is unchanged. No swelling or tenderness in her extremities at this time.  She has maintained a good appetite and is staying well hydrated. Her weight is stable.  Her blood sugars have been well controlled on Metformin.  She states that she is due for her annual mammogram and will schedule this appointment herself.   Medications:  Allergies as of 09/16/2016   No Known Allergies     Medication List       Accurate as of 09/16/16 10:37 AM. Always use your most recent med list.          apixaban 5 MG Tabs tablet Commonly known as:  ELIQUIS Take 5 mg by mouth.   aspirin 81 MG tablet Take 81 mg by mouth daily.   Aspirin-Calcium Carbonate 81-777 MG Tabs Take by mouth.   atorvastatin 80 MG tablet Commonly known as:  LIPITOR Take 40 mg by mouth daily.   CALCIUM 600-D 600-400 MG-UNIT Tabs Generic drug:  Calcium Carbonate-Vitamin D3 Take by mouth.   diphenhydrAMINE 25 MG tablet Commonly known as:  BENADRYL Take 25 mg by mouth.   ferrous sulfate 325 (65 FE) MG  tablet Take 325 mg by mouth daily.   furosemide 40 MG tablet Commonly known as:  LASIX Take 40 mg by mouth daily.   gabapentin 300 MG capsule Commonly known as:  NEURONTIN Take 1 capsule (300 mg total) by mouth at bedtime. 90 day supply   GLUCOCOM BLOOD GLUCOSE MONITOR Devi USE TO CHECK BLOOD SUGAR EVERY MORNING FASTING - E11.49   latanoprost 0.005 % ophthalmic solution Commonly known as:  XALATAN   lidocaine-prilocaine cream Commonly known as:  EMLA Apply 1 application topically as needed.   loratadine 10 MG tablet Commonly known as:  CLARITIN OTC Take by mouth one a day for 10 days, then once a day as needed -allergies.   metFORMIN 500 MG tablet Commonly known as:  GLUCOPHAGE Take 500 mg by mouth daily with breakfast.   potassium chloride 10 MEQ tablet Commonly known as:  K-DUR,KLOR-CON Take 10 mEq by mouth daily.   Propylene Glycol 0.6 % Soln 1 drop.   raloxifene 60 MG tablet Commonly known as:  EVISTA Take 60 mg by mouth daily.   ranitidine 150 MG tablet Commonly known as:  ZANTAC 1 tablet by mouth twice a day for month and then take twice a day for needed - GERD   tiZANidine 4 MG capsule Commonly known as:  ZANAFLEX Take 4 mg by mouth at bedtime.   Vitamin D (Ergocalciferol) 50000 units Caps capsule Commonly  known as:  DRISDOL TAKE ONE CAPSULE BY MOUTH 3 DAYS WEEK WITH A MEAL FOR LOW VITAMIN D   ergocalciferol 50000 units capsule Commonly known as:  VITAMIN D2 Take 1 capsule (50,000 Units total) by mouth once a week. 3 month supply       Allergies: No Known Allergies  Past Medical History, Surgical history, Social history, and Family History were reviewed and updated.  Review of Systems: All other 10 point review of systems is negative.   Physical Exam:  weight is 170 lb (77.1 kg). Her oral temperature is 98 F (36.7 C). Her blood pressure is 149/65 (abnormal) and her pulse is 73. Her respiration is 19 and oxygen saturation is 93%.   Wt  Readings from Last 3 Encounters:  09/16/16 170 lb (77.1 kg)  04/01/16 179 lb (81.2 kg)  09/25/15 179 lb 12.8 oz (81.6 kg)    Well-developed well-nourished African-American female. Head and neck exam shows no ocular or oral lesions. She has no adenopathy in the neck.. Lungs are with scattered wheezes bilaterally. She has some crackles bilaterally. She has decent air movement. Cardiac exam regular rate and rhythm. There are no murmurs, rubs or bruits. Abdomen is soft. She has good bowel sounds. There is no fluid wave. There is no palpable liver or spleen tip. Extremities shows no clubbing, cyanosis or edema. She has good range of motion  of her joints. Neurological exam shows no focal neurological deficits.skin exam shows no rashes, ecchymoses or petechia  Lab Results  Component Value Date   WBC 4.0 09/16/2016   HGB 12.7 09/16/2016   HCT 39.1 09/16/2016   MCV 80 (L) 09/16/2016   PLT 140 (L) 09/16/2016   Lab Results  Component Value Date   FERRITIN 234 09/16/2011   IRON 72 09/16/2011   TIBC 528 (H) 09/16/2011   UIBC 456 (H) 09/16/2011   IRONPCTSAT 14 (L) 09/16/2011   Lab Results  Component Value Date   RETICCTPCT 1.2 11/13/2013   RBC 4.87 09/16/2016   RETICCTABS 59.2 11/13/2013   No results found for: Nils Pyle, [facility] Lab Results  Component Value Date   IGGSERUM 557 (L) 06/19/2013   IGA 100 06/19/2013   IGMSERUM 95 06/19/2013   Lab Results  Component Value Date   TOTALPROTELP 6.3 09/16/2011   ALBUMINELP 59.9 09/16/2011   A1GS 7.3 (H) 09/16/2011   A2GS 9.9 09/16/2011   BETS 8.4 (H) 09/16/2011   BETA2SER 4.8 09/16/2011   GAMS 9.7 (L) 09/16/2011   MSPIKE NOT DET 09/16/2011   SPEI * 09/16/2011     Chemistry      Component Value Date/Time   NA 140 04/01/2016 0836   K 3.6 04/01/2016 0836   CL 101 12/08/2014 1138   CL 99 05/28/2012 1036   CO2 27 04/01/2016 0836   BUN 15.2 04/01/2016 0836   CREATININE 0.7 04/01/2016 0836      Component Value  Date/Time   CALCIUM 8.8 04/01/2016 0836   ALKPHOS 138 04/01/2016 0836   AST 26 04/01/2016 0836   ALT 16 04/01/2016 0836   BILITOT 0.57 04/01/2016 0836     Impression and Plan: Kristine Sullivan is a very pleasant 83 yo African American female with a history of marginal zone lymphoma and autoimmune hemolytic anemia. She was treated with systemic chemotherapy and has remained in remission. So far, there has been no evidence of recurrence.   She continues to do quite well and is asymptomatic at this time with no complaints.  She still has her port a cath in place and will have this flushed every 8 weeks.   We will plan to see her back in 6 months for repeat lab work and follow-up.   She will contact our office with any questions or concerns. We can certainly see her sooner if need be.   [name], MD 7/27/201810:37 AM

## 2016-11-18 ENCOUNTER — Ambulatory Visit (HOSPITAL_BASED_OUTPATIENT_CLINIC_OR_DEPARTMENT_OTHER): Payer: Medicare Other

## 2016-11-18 DIAGNOSIS — C8307 Small cell B-cell lymphoma, spleen: Secondary | ICD-10-CM

## 2016-11-18 DIAGNOSIS — Z452 Encounter for adjustment and management of vascular access device: Secondary | ICD-10-CM | POA: Diagnosis not present

## 2016-11-18 MED ORDER — HEPARIN SOD (PORK) LOCK FLUSH 100 UNIT/ML IV SOLN
500.0000 [IU] | Freq: Once | INTRAVENOUS | Status: AC
  Administered 2016-11-18: 500 [IU] via INTRAVENOUS
  Filled 2016-11-18: qty 5

## 2016-11-18 MED ORDER — SODIUM CHLORIDE 0.9% FLUSH
10.0000 mL | INTRAVENOUS | Status: DC | PRN
  Administered 2016-11-18: 10 mL via INTRAVENOUS
  Filled 2016-11-18: qty 10

## 2016-11-18 NOTE — Patient Instructions (Signed)
Implanted Port Insertion, Care After °This sheet gives you information about how to care for yourself after your procedure. Your health care provider may also give you more specific instructions. If you have problems or questions, contact your health care provider. °What can I expect after the procedure? °After your procedure, it is common to have: °· Discomfort at the port insertion site. °· Bruising on the skin over the port. This should improve over 3-4 days. ° °Follow these instructions at home: °Port care °· After your port is placed, you will get a manufacturer's information card. The card has information about your port. Keep this card with you at all times. °· Take care of the port as told by your health care provider. Ask your health care provider if you or a family member can get training for taking care of the port at home. A home health care nurse may also take care of the port. °· Make sure to remember what type of port you have. °Incision care °· Follow instructions from your health care provider about how to take care of your port insertion site. Make sure you: °? Wash your hands with soap and water before you change your bandage (dressing). If soap and water are not available, use hand sanitizer. °? Change your dressing as told by your health care provider. °? Leave stitches (sutures), skin glue, or adhesive strips in place. These skin closures may need to stay in place for 2 weeks or longer. If adhesive strip edges start to loosen and curl up, you may trim the loose edges. Do not remove adhesive strips completely unless your health care provider tells you to do that. °· Check your port insertion site every day for signs of infection. Check for: °? More redness, swelling, or pain. °? More fluid or blood. °? Warmth. °? Pus or a bad smell. °General instructions °· Do not take baths, swim, or use a hot tub until your health care provider approves. °· Do not lift anything that is heavier than 10 lb (4.5  kg) for a week, or as told by your health care provider. °· Ask your health care provider when it is okay to: °? Return to work or school. °? Resume usual physical activities or sports. °· Do not drive for 24 hours if you were given a medicine to help you relax (sedative). °· Take over-the-counter and prescription medicines only as told by your health care provider. °· Wear a medical alert bracelet in case of an emergency. This will tell any health care providers that you have a port. °· Keep all follow-up visits as told by your health care provider. This is important. °Contact a health care provider if: °· You cannot flush your port with saline as directed, or you cannot draw blood from the port. °· You have a fever or chills. °· You have more redness, swelling, or pain around your port insertion site. °· You have more fluid or blood coming from your port insertion site. °· Your port insertion site feels warm to the touch. °· You have pus or a bad smell coming from the port insertion site. °Get help right away if: °· You have chest pain or shortness of breath. °· You have bleeding from your port that you cannot control. °Summary °· Take care of the port as told by your health care provider. °· Change your dressing as told by your health care provider. °· Keep all follow-up visits as told by your health care provider. °  This information is not intended to replace advice given to you by your health care provider. Make sure you discuss any questions you have with your health care provider. °Document Released: 11/28/2012 Document Revised: 12/30/2015 Document Reviewed: 12/30/2015 °Elsevier Interactive Patient Education © 2017 Elsevier Inc. ° °

## 2017-01-20 ENCOUNTER — Ambulatory Visit (HOSPITAL_BASED_OUTPATIENT_CLINIC_OR_DEPARTMENT_OTHER): Payer: Medicare Other

## 2017-01-20 VITALS — BP 156/63 | HR 68 | Temp 97.9°F | Resp 20

## 2017-01-20 DIAGNOSIS — Z8572 Personal history of non-Hodgkin lymphomas: Secondary | ICD-10-CM

## 2017-01-20 DIAGNOSIS — D508 Other iron deficiency anemias: Secondary | ICD-10-CM

## 2017-01-20 DIAGNOSIS — Z452 Encounter for adjustment and management of vascular access device: Secondary | ICD-10-CM

## 2017-01-20 DIAGNOSIS — D589 Hereditary hemolytic anemia, unspecified: Secondary | ICD-10-CM

## 2017-01-20 MED ORDER — HEPARIN SOD (PORK) LOCK FLUSH 100 UNIT/ML IV SOLN
500.0000 [IU] | Freq: Once | INTRAVENOUS | Status: AC | PRN
  Administered 2017-01-20: 500 [IU] via INTRAVENOUS
  Filled 2017-01-20: qty 5

## 2017-01-20 MED ORDER — SODIUM CHLORIDE 0.9% FLUSH
10.0000 mL | INTRAVENOUS | Status: DC | PRN
  Administered 2017-01-20: 10 mL via INTRAVENOUS
  Filled 2017-01-20: qty 10

## 2017-03-17 ENCOUNTER — Inpatient Hospital Stay: Payer: Medicare Other

## 2017-03-17 ENCOUNTER — Inpatient Hospital Stay: Payer: Medicare Other | Attending: Hematology & Oncology

## 2017-03-17 ENCOUNTER — Inpatient Hospital Stay (HOSPITAL_BASED_OUTPATIENT_CLINIC_OR_DEPARTMENT_OTHER): Payer: Medicare Other | Admitting: Hematology & Oncology

## 2017-03-17 ENCOUNTER — Other Ambulatory Visit: Payer: Self-pay

## 2017-03-17 VITALS — BP 155/70 | HR 79 | Temp 98.3°F | Resp 18 | Wt 177.0 lb

## 2017-03-17 DIAGNOSIS — C8307 Small cell B-cell lymphoma, spleen: Secondary | ICD-10-CM

## 2017-03-17 DIAGNOSIS — Z95828 Presence of other vascular implants and grafts: Secondary | ICD-10-CM

## 2017-03-17 DIAGNOSIS — D591 Other autoimmune hemolytic anemias: Secondary | ICD-10-CM | POA: Diagnosis not present

## 2017-03-17 DIAGNOSIS — C8300 Small cell B-cell lymphoma, unspecified site: Secondary | ICD-10-CM | POA: Diagnosis present

## 2017-03-17 DIAGNOSIS — Z9221 Personal history of antineoplastic chemotherapy: Secondary | ICD-10-CM

## 2017-03-17 DIAGNOSIS — D696 Thrombocytopenia, unspecified: Secondary | ICD-10-CM

## 2017-03-17 DIAGNOSIS — D500 Iron deficiency anemia secondary to blood loss (chronic): Secondary | ICD-10-CM

## 2017-03-17 LAB — CBC WITH DIFFERENTIAL (CANCER CENTER ONLY)
BASOS ABS: 0 10*3/uL (ref 0.0–0.1)
BASOS PCT: 0 %
Eosinophils Absolute: 0.2 10*3/uL (ref 0.0–0.5)
Eosinophils Relative: 4 %
HEMATOCRIT: 36.7 % (ref 34.8–46.6)
HEMOGLOBIN: 11.4 g/dL — AB (ref 11.6–15.9)
LYMPHS PCT: 11 %
Lymphs Abs: 0.5 10*3/uL — ABNORMAL LOW (ref 0.9–3.3)
MCH: 26.3 pg (ref 26.0–34.0)
MCHC: 31.1 g/dL — ABNORMAL LOW (ref 32.0–36.0)
MCV: 84.6 fL (ref 81.0–101.0)
Monocytes Absolute: 0.3 10*3/uL (ref 0.1–0.9)
Monocytes Relative: 7 %
NEUTROS ABS: 3.6 10*3/uL (ref 1.5–6.5)
NEUTROS PCT: 78 %
Platelet Count: 152 10*3/uL (ref 145–400)
RBC: 4.34 MIL/uL (ref 3.70–5.32)
RDW: 15.1 % (ref 11.1–15.7)
WBC: 4.6 10*3/uL (ref 3.9–10.3)

## 2017-03-17 LAB — CMP (CANCER CENTER ONLY)
ALBUMIN: 3.8 g/dL (ref 3.5–5.0)
ALT: 15 U/L (ref 0–55)
AST: 24 U/L (ref 5–34)
Alkaline Phosphatase: 148 U/L (ref 40–150)
Anion gap: 9 (ref 3–11)
BILIRUBIN TOTAL: 0.7 mg/dL (ref 0.2–1.2)
BUN: 10 mg/dL (ref 7–26)
CO2: 25 mmol/L (ref 22–29)
CREATININE: 0.69 mg/dL (ref 0.60–1.10)
Calcium: 8.9 mg/dL (ref 8.4–10.4)
Chloride: 105 mmol/L (ref 98–109)
GFR, Est AFR Am: >60 mL/min (ref >60)
GLUCOSE: 105 mg/dL (ref 70–140)
POTASSIUM: 4.4 mmol/L (ref 3.3–4.7)
Sodium: 139 mmol/L (ref 136–145)
TOTAL PROTEIN: 6.4 g/dL (ref 6.4–8.3)

## 2017-03-17 LAB — LACTATE DEHYDROGENASE: LDH: 284 U/L — AB (ref 125–245)

## 2017-03-17 MED ORDER — HEPARIN SOD (PORK) LOCK FLUSH 100 UNIT/ML IV SOLN
500.0000 [IU] | Freq: Once | INTRAVENOUS | Status: AC
  Administered 2017-03-17: 500 [IU] via INTRAVENOUS
  Filled 2017-03-17: qty 5

## 2017-03-17 MED ORDER — SODIUM CHLORIDE 0.9% FLUSH
10.0000 mL | INTRAVENOUS | Status: AC | PRN
  Administered 2017-03-17: 10 mL via INTRAVENOUS
  Filled 2017-03-17: qty 10

## 2017-03-17 NOTE — Addendum Note (Signed)
Addended by: San Morelle on: 03/17/2017 10:06 AM   Modules accepted: Orders, SmartSet

## 2017-03-17 NOTE — Progress Notes (Signed)
Hematology and Oncology Follow Up Visit  Kristine Sullivan [id number] [birth year] 84 y.o. 03/17/2017   Principle Diagnosis:  Marginal zone lymphoma  Current Therapy:  CVP I think completed 8 cycles on 04/09/2012  Maintenance Rituxan - 2 years completed in June 2016    Interim History:  Kristine Sullivan is here today for follow-up.  We see her every 6 months.  In between, she comes in for her Port-A-Cath flush.  Since we last saw her, she has been doing okay.  She is trying to manage her blood sugars.  She is having no problems with infections.  She is having no problems with nausea or vomiting.  There is been no change in bowel or bladder habits.  She has had no leg swelling.  She did take her flu shot and pneumonia shot.  Overall, her performance status is ECOG 2.  Medications:  Allergies as of 03/17/2017   No Known Allergies     Medication List        Accurate as of 03/17/17 10:41 AM. Always use your most recent med list.          apixaban 5 MG Tabs tablet Commonly known as:  ELIQUIS Take 5 mg by mouth.   aspirin 81 MG tablet Take 81 mg by mouth daily.   atorvastatin 80 MG tablet Commonly known as:  LIPITOR Take 40 mg by mouth daily.   CALCIUM 600-D 600-400 MG-UNIT Tabs Generic drug:  Calcium Carbonate-Vitamin D3 Take by mouth.   diphenhydrAMINE 25 MG tablet Commonly known as:  BENADRYL Take 25 mg by mouth.   ferrous sulfate 325 (65 FE) MG tablet Take 325 mg by mouth daily.   furosemide 40 MG tablet Commonly known as:  LASIX Take 40 mg by mouth daily.   gabapentin 300 MG capsule Commonly known as:  NEURONTIN Take 1 capsule (300 mg total) by mouth at bedtime. 90 day supply   GLUCOCOM BLOOD GLUCOSE MONITOR Devi USE TO CHECK BLOOD SUGAR EVERY MORNING FASTING - E11.49   latanoprost 0.005 % ophthalmic solution Commonly known as:  XALATAN   lidocaine-prilocaine cream Commonly known as:  EMLA Apply 1 application topically as needed.   loratadine 10 MG  tablet Commonly known as:  CLARITIN OTC Take by mouth one a day for 10 days, then once a day as needed -allergies.   metFORMIN 500 MG tablet Commonly known as:  GLUCOPHAGE Take 500 mg by mouth daily with breakfast.   potassium chloride 10 MEQ tablet Commonly known as:  K-DUR,KLOR-CON Take 10 mEq by mouth daily.   Propylene Glycol 0.6 % Soln 1 drop.   raloxifene 60 MG tablet Commonly known as:  EVISTA Take 60 mg by mouth daily.   ranitidine 150 MG tablet Commonly known as:  ZANTAC 1 tablet by mouth twice a day for month and then take twice a day for needed - GERD   tiZANidine 4 MG capsule Commonly known as:  ZANAFLEX Take 4 mg by mouth at bedtime.   Vitamin D (Ergocalciferol) 50000 units Caps capsule Commonly known as:  DRISDOL TAKE ONE CAPSULE BY MOUTH 3 DAYS WEEK WITH A MEAL FOR LOW VITAMIN D       Allergies: No Known Allergies  Past Medical History, Surgical history, Social history, and Family History were reviewed and updated.  Review of Systems: Review of Systems  Constitutional: Negative.   HENT: Negative.   Eyes: Negative.   Respiratory: Negative.   Cardiovascular: Negative.   Gastrointestinal: Negative.   Genitourinary: Negative.  Musculoskeletal: Negative.   Skin: Negative.   Neurological: Negative.   Endo/Heme/Allergies: Negative.   Psychiatric/Behavioral: Negative.      Physical Exam:  weight is 177 lb (80.3 kg). Her oral temperature is 98.3 F (36.8 C). Her blood pressure is 155/70 (abnormal) and her pulse is 79. Her respiration is 18 and oxygen saturation is 94%.   Wt Readings from Last 3 Encounters:  03/17/17 177 lb (80.3 kg)  09/16/16 170 lb (77.1 kg)  04/01/16 179 lb (81.2 kg)    Physical Exam  Constitutional: She is oriented to person, place, and time.  HENT:  Head: Normocephalic and atraumatic.  Mouth/Throat: Oropharynx is clear and moist.  Eyes: EOM are normal. Pupils are equal, round, and reactive to light.  Neck: Normal range  of motion.  Cardiovascular: Normal rate, regular rhythm and normal heart sounds.  Pulmonary/Chest: Effort normal and breath sounds normal.  Abdominal: Soft. Bowel sounds are normal.  Musculoskeletal: Normal range of motion. She exhibits no edema, tenderness or deformity.  Lymphadenopathy:    She has no cervical adenopathy.  Neurological: She is alert and oriented to person, place, and time.  Skin: Skin is warm and dry. No rash noted. No erythema.  Psychiatric: She has a normal mood and affect. Her behavior is normal. Judgment and thought content normal.  Vitals reviewed.   Lab Results  Component Value Date   WBC 4.6 03/17/2017   HGB 12.7 09/16/2016   HCT 36.7 03/17/2017   MCV 84.6 03/17/2017   PLT 152 03/17/2017   Lab Results  Component Value Date   FERRITIN 234 09/16/2011   IRON 72 09/16/2011   TIBC 528 (H) 09/16/2011   UIBC 456 (H) 09/16/2011   IRONPCTSAT 14 (L) 09/16/2011   Lab Results  Component Value Date   RETICCTPCT 1.2 11/13/2013   RBC 4.34 03/17/2017   RETICCTABS 59.2 11/13/2013   No results found for: Nils Pyle, [facility] Lab Results  Component Value Date   IGGSERUM 557 (L) 06/19/2013   IGA 100 06/19/2013   IGMSERUM 95 06/19/2013   Lab Results  Component Value Date   TOTALPROTELP 6.3 09/16/2011   ALBUMINELP 59.9 09/16/2011   A1GS 7.3 (H) 09/16/2011   A2GS 9.9 09/16/2011   BETS 8.4 (H) 09/16/2011   BETA2SER 4.8 09/16/2011   GAMS 9.7 (L) 09/16/2011   MSPIKE NOT DET 09/16/2011   SPEI * 09/16/2011     Chemistry      Component Value Date/Time   NA 140 09/16/2016 0945   K 3.2 (L) 09/16/2016 0945   CL 101 12/08/2014 1138   CL 99 05/28/2012 1036   CO2 26 09/16/2016 0945   BUN 9.0 09/16/2016 0945   CREATININE 0.7 09/16/2016 0945      Component Value Date/Time   CALCIUM 9.0 09/16/2016 0945   ALKPHOS 122 09/16/2016 0945   AST 25 09/16/2016 0945   ALT 15 09/16/2016 0945   BILITOT 0.90 09/16/2016 0945     Impression and Plan: Ms.  Sullivan is a very pleasant 84 yo African American female with a history of marginal zone lymphoma and autoimmune hemolytic anemia. She was treated with systemic chemotherapy and has remained in remission.   She received chemotherapy with R-CVP for 8 cycles.  She completed this in February 2014.  We then gave her 2 years of maintenance Rituxan.  She completed this in June 2016.  Everything looks fantastic.  I would think that if she relapsed, we would see this with her blood counts and she did  have another episode of hemolytic anemia.  We will get her back in 6 more months.  She will have her Port-A-Cath flushed every 2 months.    [name], MD 1/25/201910:41 AM

## 2017-05-11 ENCOUNTER — Inpatient Hospital Stay: Payer: Medicare Other | Attending: Hematology & Oncology

## 2017-05-11 ENCOUNTER — Other Ambulatory Visit: Payer: Self-pay

## 2017-05-11 VITALS — BP 149/65 | HR 85 | Temp 98.1°F | Resp 18

## 2017-05-11 DIAGNOSIS — D508 Other iron deficiency anemias: Secondary | ICD-10-CM

## 2017-05-11 DIAGNOSIS — C8300 Small cell B-cell lymphoma, unspecified site: Secondary | ICD-10-CM | POA: Insufficient documentation

## 2017-05-11 DIAGNOSIS — Z452 Encounter for adjustment and management of vascular access device: Secondary | ICD-10-CM | POA: Insufficient documentation

## 2017-05-11 DIAGNOSIS — D589 Hereditary hemolytic anemia, unspecified: Secondary | ICD-10-CM

## 2017-05-11 MED ORDER — HEPARIN SOD (PORK) LOCK FLUSH 100 UNIT/ML IV SOLN
500.0000 [IU] | Freq: Once | INTRAVENOUS | Status: AC | PRN
  Administered 2017-05-11: 500 [IU] via INTRAVENOUS
  Filled 2017-05-11: qty 5

## 2017-05-11 MED ORDER — SODIUM CHLORIDE 0.9% FLUSH
10.0000 mL | INTRAVENOUS | Status: DC | PRN
  Administered 2017-05-11: 10 mL via INTRAVENOUS
  Filled 2017-05-11: qty 10

## 2017-05-11 NOTE — Patient Instructions (Signed)
Implanted Port Home Guide An implanted port is a type of central line that is placed under the skin. Central lines are used to provide IV access when treatment or nutrition needs to be given through a person's veins. Implanted ports are used for long-term IV access. An implanted port may be placed because:  You need IV medicine that would be irritating to the small veins in your hands or arms.  You need long-term IV medicines, such as antibiotics.  You need IV nutrition for a long period.  You need frequent blood draws for lab tests.  You need dialysis.  Implanted ports are usually placed in the chest area, but they can also be placed in the upper arm, the abdomen, or the leg. An implanted port has two main parts:  Reservoir. The reservoir is round and will appear as a small, raised area under your skin. The reservoir is the part where a needle is inserted to give medicines or draw blood.  Catheter. The catheter is a thin, flexible tube that extends from the reservoir. The catheter is placed into a large vein. Medicine that is inserted into the reservoir goes into the catheter and then into the vein.  How will I care for my incision site? Do not get the incision site wet. Bathe or shower as directed by your health care provider. How is my port accessed? Special steps must be taken to access the port:  Before the port is accessed, a numbing cream can be placed on the skin. This helps numb the skin over the port site.  Your health care provider uses a sterile technique to access the port. ? Your health care provider must put on a mask and sterile gloves. ? The skin over your port is cleaned carefully with an antiseptic and allowed to dry. ? The port is gently pinched between sterile gloves, and a needle is inserted into the port.  Only "non-coring" port needles should be used to access the port. Once the port is accessed, a blood return should be checked. This helps ensure that the port  is in the vein and is not clogged.  If your port needs to remain accessed for a constant infusion, a clear (transparent) bandage will be placed over the needle site. The bandage and needle will need to be changed every week, or as directed by your health care provider.  Keep the bandage covering the needle clean and dry. Do not get it wet. Follow your health care provider's instructions on how to take a shower or bath while the port is accessed.  If your port does not need to stay accessed, no bandage is needed over the port.  What is flushing? Flushing helps keep the port from getting clogged. Follow your health care provider's instructions on how and when to flush the port. Ports are usually flushed with saline solution or a medicine called heparin. The need for flushing will depend on how the port is used.  If the port is used for intermittent medicines or blood draws, the port will need to be flushed: ? After medicines have been given. ? After blood has been drawn. ? As part of routine maintenance.  If a constant infusion is running, the port may not need to be flushed.  How long will my port stay implanted? The port can stay in for as long as your health care provider thinks it is needed. When it is time for the port to come out, surgery will be   done to remove it. The procedure is similar to the one performed when the port was put in. When should I seek immediate medical care? When you have an implanted port, you should seek immediate medical care if:  You notice a bad smell coming from the incision site.  You have swelling, redness, or drainage at the incision site.  You have more swelling or pain at the port site or the surrounding area.  You have a fever that is not controlled with medicine.  This information is not intended to replace advice given to you by your health care provider. Make sure you discuss any questions you have with your health care provider. Document  Released: 02/07/2005 Document Revised: 07/16/2015 Document Reviewed: 10/15/2012 Elsevier Interactive Patient Education  2017 [employer]vier Inc.  

## 2017-06-01 DIAGNOSIS — D841 Defects in the complement system: Secondary | ICD-10-CM | POA: Insufficient documentation

## 2017-06-02 ENCOUNTER — Telehealth: Payer: Self-pay | Admitting: *Deleted

## 2017-06-02 NOTE — Telephone Encounter (Signed)
Patient's son states patient has been admitted to [facility] for swelling of her lips (angioedema) and mouth. He believes this to be related to a new medication - duloxetine. He wants to know if Dr Marin Olp wants her to continue taking this medication.  Upon review of the chart, we don't have a record of patient taking this medication. Dr Marin Olp is not the prescriber.   Explained to the son that the patient's angioedema will be managed by her admitting physician. He and the patient need to work with those physician to determine cause and follow instructions regarding what medications to stop or continue. He understood the instructions.

## 2017-07-13 ENCOUNTER — Inpatient Hospital Stay: Payer: Medicare Other | Attending: Hematology & Oncology

## 2017-07-13 VITALS — BP 147/63 | HR 66 | Temp 97.5°F | Resp 18

## 2017-07-13 DIAGNOSIS — C8300 Small cell B-cell lymphoma, unspecified site: Secondary | ICD-10-CM | POA: Diagnosis not present

## 2017-07-13 DIAGNOSIS — Z452 Encounter for adjustment and management of vascular access device: Secondary | ICD-10-CM | POA: Insufficient documentation

## 2017-07-13 DIAGNOSIS — Z95828 Presence of other vascular implants and grafts: Secondary | ICD-10-CM

## 2017-07-13 MED ORDER — SODIUM CHLORIDE 0.9% FLUSH
10.0000 mL | INTRAVENOUS | Status: DC | PRN
  Administered 2017-07-13: 10 mL via INTRAVENOUS
  Filled 2017-07-13: qty 10

## 2017-07-13 MED ORDER — HEPARIN SOD (PORK) LOCK FLUSH 100 UNIT/ML IV SOLN
500.0000 [IU] | Freq: Once | INTRAVENOUS | Status: AC
  Administered 2017-07-13: 500 [IU] via INTRAVENOUS
  Filled 2017-07-13: qty 5

## 2017-08-28 ENCOUNTER — Other Ambulatory Visit: Payer: Self-pay | Admitting: Hematology & Oncology

## 2017-08-28 DIAGNOSIS — C8580 Other specified types of non-Hodgkin lymphoma, unspecified site: Secondary | ICD-10-CM

## 2017-09-14 ENCOUNTER — Other Ambulatory Visit: Payer: Self-pay

## 2017-09-14 ENCOUNTER — Encounter: Payer: Self-pay | Admitting: Hematology & Oncology

## 2017-09-14 ENCOUNTER — Inpatient Hospital Stay: Payer: Medicare Other

## 2017-09-14 ENCOUNTER — Inpatient Hospital Stay: Payer: Medicare Other | Attending: Hematology & Oncology | Admitting: Hematology & Oncology

## 2017-09-14 VITALS — BP 146/60 | HR 66 | Temp 98.4°F | Resp 18 | Wt 166.0 lb

## 2017-09-14 DIAGNOSIS — D509 Iron deficiency anemia, unspecified: Secondary | ICD-10-CM | POA: Diagnosis not present

## 2017-09-14 DIAGNOSIS — D591 Other autoimmune hemolytic anemias: Secondary | ICD-10-CM | POA: Insufficient documentation

## 2017-09-14 DIAGNOSIS — C8307 Small cell B-cell lymphoma, spleen: Secondary | ICD-10-CM

## 2017-09-14 DIAGNOSIS — C8583 Other specified types of non-Hodgkin lymphoma, intra-abdominal lymph nodes: Secondary | ICD-10-CM

## 2017-09-14 DIAGNOSIS — C884 Extranodal marginal zone B-cell lymphoma of mucosa-associated lymphoid tissue [MALT-lymphoma]: Secondary | ICD-10-CM

## 2017-09-14 DIAGNOSIS — K909 Intestinal malabsorption, unspecified: Secondary | ICD-10-CM

## 2017-09-14 DIAGNOSIS — Z79899 Other long term (current) drug therapy: Secondary | ICD-10-CM | POA: Diagnosis not present

## 2017-09-14 DIAGNOSIS — D500 Iron deficiency anemia secondary to blood loss (chronic): Secondary | ICD-10-CM

## 2017-09-14 DIAGNOSIS — D696 Thrombocytopenia, unspecified: Secondary | ICD-10-CM

## 2017-09-14 LAB — FERRITIN: FERRITIN: 51 ng/mL (ref 11–307)

## 2017-09-14 LAB — CBC WITH DIFFERENTIAL (CANCER CENTER ONLY)
Basophils Absolute: 0 10*3/uL (ref 0.0–0.1)
Basophils Relative: 1 %
EOS ABS: 0.1 10*3/uL (ref 0.0–0.5)
EOS PCT: 2 %
HCT: 37.4 % (ref 34.8–46.6)
Hemoglobin: 11.9 g/dL (ref 11.6–15.9)
LYMPHS ABS: 0.5 10*3/uL — AB (ref 0.9–3.3)
Lymphocytes Relative: 16 %
MCH: 25.4 pg — ABNORMAL LOW (ref 26.0–34.0)
MCHC: 31.8 g/dL — AB (ref 32.0–36.0)
MCV: 79.9 fL — ABNORMAL LOW (ref 81.0–101.0)
MONO ABS: 0.3 10*3/uL (ref 0.1–0.9)
MONOS PCT: 9 %
Neutro Abs: 2.4 10*3/uL (ref 1.5–6.5)
Neutrophils Relative %: 72 %
PLATELETS: 136 10*3/uL — AB (ref 145–400)
RBC: 4.68 MIL/uL (ref 3.70–5.32)
RDW: 15.2 % (ref 11.1–15.7)
WBC Count: 3.3 10*3/uL — ABNORMAL LOW (ref 3.9–10.0)

## 2017-09-14 LAB — CMP (CANCER CENTER ONLY)
ALBUMIN: 3.7 g/dL (ref 3.5–5.0)
ALT: 13 U/L (ref 0–44)
ANION GAP: 7 (ref 5–15)
AST: 23 U/L (ref 15–41)
Alkaline Phosphatase: 139 U/L — ABNORMAL HIGH (ref 38–126)
BILIRUBIN TOTAL: 0.6 mg/dL (ref 0.3–1.2)
BUN: 12 mg/dL (ref 8–23)
CO2: 27 mmol/L (ref 22–32)
Calcium: 8.9 mg/dL (ref 8.9–10.3)
Chloride: 106 mmol/L (ref 98–111)
Creatinine: 0.71 mg/dL (ref 0.44–1.00)
GFR, Est AFR Am: >60 mL/min (ref >60)
GFR, Estimated: >60 mL/min (ref >60)
GLUCOSE: 105 mg/dL — AB (ref 70–99)
Potassium: 3.9 mmol/L (ref 3.5–5.1)
SODIUM: 140 mmol/L (ref 135–145)
TOTAL PROTEIN: 6.2 g/dL — AB (ref 6.5–8.1)

## 2017-09-14 LAB — LACTATE DEHYDROGENASE: LDH: 285 U/L — AB (ref 98–192)

## 2017-09-14 LAB — RETICULOCYTES
RBC.: 4.65 MIL/uL (ref 3.70–5.45)
RETIC COUNT ABSOLUTE: 79.1 10*3/uL (ref 33.7–90.7)
Retic Ct Pct: 1.7 % (ref 0.7–2.1)

## 2017-09-14 LAB — IRON AND TIBC
Iron: 38 ug/dL — ABNORMAL LOW (ref 41–142)
SATURATION RATIOS: 9 % — AB (ref 21–57)
TIBC: 411 ug/dL (ref 236–444)
UIBC: 373 ug/dL

## 2017-09-14 MED ORDER — HEPARIN SOD (PORK) LOCK FLUSH 100 UNIT/ML IV SOLN
500.0000 [IU] | Freq: Once | INTRAVENOUS | Status: AC
  Administered 2017-09-14: 500 [IU] via INTRAVENOUS
  Filled 2017-09-14: qty 5

## 2017-09-14 MED ORDER — SODIUM CHLORIDE 0.9% FLUSH
10.0000 mL | INTRAVENOUS | Status: DC | PRN
  Administered 2017-09-14: 10 mL via INTRAVENOUS
  Filled 2017-09-14: qty 10

## 2017-09-15 LAB — BETA 2 MICROGLOBULIN, SERUM: Beta-2 Microglobulin: 2.5 mg/L — ABNORMAL HIGH (ref 0.6–2.4)

## 2017-09-16 ENCOUNTER — Encounter: Payer: Self-pay | Admitting: Hematology & Oncology

## 2017-09-16 DIAGNOSIS — C8583 Other specified types of non-Hodgkin lymphoma, intra-abdominal lymph nodes: Secondary | ICD-10-CM

## 2017-09-16 DIAGNOSIS — K909 Intestinal malabsorption, unspecified: Secondary | ICD-10-CM

## 2017-09-16 HISTORY — DX: Intestinal malabsorption, unspecified: K90.9

## 2017-09-16 HISTORY — DX: Other specified types of non-hodgkin lymphoma, intra-abdominal lymph nodes: C85.83

## 2017-09-16 NOTE — Progress Notes (Signed)
Hematology and Oncology Follow Up Visit  Janine Reller [id number] [birth year] 84 y.o. 09/16/2017   Principle Diagnosis:  Marginal zone lymphoma Iron deficiency anemia  Current Therapy:  CVP I think completed 8 cycles on 04/09/2012  Maintenance Rituxan - 2 years completed in June 2016 IV iron - last dose on 09/2017    Interim History:  Ms. Maya is here today for an early visit.  I was called by Dr. taking care of her in the hospital.  Apparently, she is been having episodes of recurrent angioedema.  This is sort of how she presented with her marginal zone lymphoma.  Because this, I thought we had to see her back to see what is going on.  She has been out of therapy now for 3 years.  She has had no problems with weight loss.  She has had no fever.  She has had no rashes.  She is had no swollen lymph nodes.  Her appetite has been good.  Her blood sugars have been under decent control.  Of note, her iron studies have been quite low.  Her ferritin is 51 with an iron saturation of 9%.  I think we may have to consider giving her some IV iron.  Her beta-2 microglobulin is a little elevated at 2.5.  I will had to set up with a PET scan and see if we can see anything with a PET scan that might suggest recurrent disease.  She has a decent performance status.  Her performance status is ECOG 1.  Medications:  Allergies as of 09/14/2017      Reactions   Lisinopril Swelling   Facial swelling  Facial swelling    Timolol Other (See Comments)   Pregabalin Other (See Comments)   constipation constipation      Medication List        Accurate as of 09/14/17 11:59 PM. Always use your most recent med list.          apixaban 5 MG Tabs tablet Commonly known as:  ELIQUIS Take 5 mg by mouth.   aspirin 81 MG tablet Take 81 mg by mouth daily.   atorvastatin 80 MG tablet Commonly known as:  LIPITOR Take 40 mg by mouth daily.   CALCIUM 600-D 600-400 MG-UNIT Tabs Generic drug:   Calcium Carbonate-Vitamin D3 Take by mouth.   diphenhydrAMINE 25 MG tablet Commonly known as:  BENADRYL Take 25 mg by mouth.   ferrous sulfate 325 (65 FE) MG tablet Take 325 mg by mouth daily.   furosemide 40 MG tablet Commonly known as:  LASIX Take 40 mg by mouth daily.   gabapentin 300 MG capsule Commonly known as:  NEURONTIN Take 1 capsule (300 mg total) by mouth at bedtime. 90 day supply   GLUCOCOM BLOOD GLUCOSE MONITOR Devi USE TO CHECK BLOOD SUGAR EVERY MORNING FASTING - E11.49   latanoprost 0.005 % ophthalmic solution Commonly known as:  XALATAN   lidocaine-prilocaine cream Commonly known as:  EMLA Apply 1 application topically as needed.   loratadine 10 MG tablet Commonly known as:  CLARITIN OTC Take by mouth one a day for 10 days, then once a day as needed -allergies.   metFORMIN 500 MG tablet Commonly known as:  GLUCOPHAGE Take 500 mg by mouth daily with breakfast.   potassium chloride 10 MEQ tablet Commonly known as:  K-DUR,KLOR-CON Take 10 mEq by mouth daily.   Propylene Glycol 0.6 % Soln 1 drop.   raloxifene 60 MG tablet Commonly known as:  EVISTA Take 60 mg by mouth daily.   ranitidine 150 MG tablet Commonly known as:  ZANTAC 1 tablet by mouth twice a day for month and then take twice a day for needed - GERD   tiZANidine 4 MG capsule Commonly known as:  ZANAFLEX Take 4 mg by mouth at bedtime.   Vitamin D (Ergocalciferol) 50000 units Caps capsule Commonly known as:  DRISDOL TAKE ONE CAPSULE BY MOUTH 3 DAYS WEEK WITH A MEAL FOR LOW VITAMIN D       Allergies:  Allergies  Allergen Reactions  . Lisinopril Swelling    Facial swelling  Facial swelling    . Timolol Other (See Comments)  . Pregabalin Other (See Comments)    constipation constipation     Past Medical History, Surgical history, Social history, and Family History were reviewed and updated.  Review of Systems: Review of Systems  Constitutional: Negative.   HENT:  Negative.   Eyes: Negative.   Respiratory: Negative.   Cardiovascular: Negative.   Gastrointestinal: Negative.   Genitourinary: Negative.   Musculoskeletal: Negative.   Skin: Negative.   Neurological: Negative.   Endo/Heme/Allergies: Negative.   Psychiatric/Behavioral: Negative.      Physical Exam:  weight is 166 lb (75.3 kg). Her oral temperature is 98.4 F (36.9 C). Her blood pressure is 146/60 (abnormal) and her pulse is 66. Her respiration is 18 and oxygen saturation is 94%.   Wt Readings from Last 3 Encounters:  09/14/17 166 lb (75.3 kg)  03/17/17 177 lb (80.3 kg)  09/16/16 170 lb (77.1 kg)    Physical Exam  Constitutional: She is oriented to person, place, and time.  HENT:  Head: Normocephalic and atraumatic.  Mouth/Throat: Oropharynx is clear and moist.  Eyes: Pupils are equal, round, and reactive to light. EOM are normal.  Neck: Normal range of motion.  Cardiovascular: Normal rate, regular rhythm and normal heart sounds.  Pulmonary/Chest: Effort normal and breath sounds normal.  Abdominal: Soft. Bowel sounds are normal.  Musculoskeletal: Normal range of motion. She exhibits no edema, tenderness or deformity.  Lymphadenopathy:    She has no cervical adenopathy.  Neurological: She is alert and oriented to person, place, and time.  Skin: Skin is warm and dry. No rash noted. No erythema.  Psychiatric: She has a normal mood and affect. Her behavior is normal. Judgment and thought content normal.  Vitals reviewed.   Lab Results  Component Value Date   WBC 3.3 (L) 09/14/2017   HGB 11.9 09/14/2017   HCT 37.4 09/14/2017   MCV 79.9 (L) 09/14/2017   PLT 136 (L) 09/14/2017   Lab Results  Component Value Date   FERRITIN 51 09/14/2017   IRON 38 (L) 09/14/2017   TIBC 411 09/14/2017   UIBC 373 09/14/2017   IRONPCTSAT 9 (L) 09/14/2017   Lab Results  Component Value Date   RETICCTPCT 1.7 09/14/2017   RBC 4.68 09/14/2017   RBC 4.65 09/14/2017   RETICCTABS 59.2  11/13/2013   No results found for: KPAFRELGTCHN, LAMBDASER, KAPLAMBRATIO Lab Results  Component Value Date   IGGSERUM 557 (L) 06/19/2013   IGA 100 06/19/2013   IGMSERUM 95 06/19/2013   Lab Results  Component Value Date   TOTALPROTELP 6.3 09/16/2011   ALBUMINELP 59.9 09/16/2011   A1GS 7.3 (H) 09/16/2011   A2GS 9.9 09/16/2011   BETS 8.4 (H) 09/16/2011   BETA2SER 4.8 09/16/2011   GAMS 9.7 (L) 09/16/2011   MSPIKE NOT DET 09/16/2011   SPEI * 09/16/2011  Chemistry      Component Value Date/Time   NA 140 09/14/2017 0932   NA 140 09/16/2016 0945   K 3.9 09/14/2017 0932   K 3.2 (L) 09/16/2016 0945   CL 106 09/14/2017 0932   CL 101 12/08/2014 1138   CL 99 05/28/2012 1036   CO2 27 09/14/2017 0932   CO2 26 09/16/2016 0945   BUN 12 09/14/2017 0932   BUN 9.0 09/16/2016 0945   CREATININE 0.71 09/14/2017 0932   CREATININE 0.7 09/16/2016 0945      Component Value Date/Time   CALCIUM 8.9 09/14/2017 0932   CALCIUM 9.0 09/16/2016 0945   ALKPHOS 139 (H) 09/14/2017 0932   ALKPHOS 122 09/16/2016 0945   AST 23 09/14/2017 0932   AST 25 09/16/2016 0945   ALT 13 09/14/2017 0932   ALT 15 09/16/2016 0945   BILITOT 0.6 09/14/2017 0932   BILITOT 0.90 09/16/2016 0945     Impression and Plan: Ms. Ackerley is a very pleasant 84 yo African American female with a history of marginal zone lymphoma and autoimmune hemolytic anemia. She was treated with systemic chemotherapy and has remained in remission.   I would be surprised if she has recurrent disease.  Again, we will see what a PET scan shows.  She is iron deficient.  We will go ahead and give her a dose of IV iron.  Hydrate this will be helpful.  We will have to follow her closely for right now.  I would like to see her back in another 2 months. [name], MD 7/27/201911:36 AM

## 2017-09-18 ENCOUNTER — Ambulatory Visit (HOSPITAL_BASED_OUTPATIENT_CLINIC_OR_DEPARTMENT_OTHER)
Admission: RE | Admit: 2017-09-18 | Discharge: 2017-09-18 | Disposition: A | Discharge status: Home / Self Care | Payer: Medicare Other | Source: Ambulatory Visit | Attending: Hematology & Oncology | Admitting: Hematology & Oncology

## 2017-09-18 ENCOUNTER — Encounter (HOSPITAL_BASED_OUTPATIENT_CLINIC_OR_DEPARTMENT_OTHER): Payer: Self-pay

## 2017-09-18 ENCOUNTER — Other Ambulatory Visit: Payer: Self-pay | Admitting: Hematology & Oncology

## 2017-09-18 DIAGNOSIS — I700 Atherosclerosis of aorta: Secondary | ICD-10-CM | POA: Diagnosis not present

## 2017-09-18 DIAGNOSIS — C8580 Other specified types of non-Hodgkin lymphoma, unspecified site: Secondary | ICD-10-CM

## 2017-09-18 DIAGNOSIS — R918 Other nonspecific abnormal finding of lung field: Secondary | ICD-10-CM

## 2017-09-18 DIAGNOSIS — M47814 Spondylosis without myelopathy or radiculopathy, thoracic region: Secondary | ICD-10-CM | POA: Insufficient documentation

## 2017-09-18 DIAGNOSIS — D259 Leiomyoma of uterus, unspecified: Secondary | ICD-10-CM | POA: Insufficient documentation

## 2017-09-18 MED ORDER — IOPAMIDOL (ISOVUE-300) INJECTION 61%
100.0000 mL | Freq: Once | INTRAVENOUS | Status: AC | PRN
  Administered 2017-09-18: 100 mL via INTRAVENOUS

## 2017-09-21 ENCOUNTER — Telehealth: Payer: Self-pay | Admitting: *Deleted

## 2017-09-21 NOTE — Telephone Encounter (Addendum)
-----   Message from [name], MD sent at 09/18/2017  6:04 PM EDT ----- Called patient to let her know she has tiny spots in her lungs.  Not sure what they are.  Need f/u CT scan in 3 months per Dr. Marin Olp

## 2017-09-29 ENCOUNTER — Inpatient Hospital Stay: Payer: Medicare Other | Attending: Hematology & Oncology

## 2017-09-29 VITALS — BP 141/52 | HR 66 | Temp 97.8°F

## 2017-09-29 DIAGNOSIS — D589 Hereditary hemolytic anemia, unspecified: Secondary | ICD-10-CM

## 2017-09-29 DIAGNOSIS — D509 Iron deficiency anemia, unspecified: Secondary | ICD-10-CM | POA: Diagnosis present

## 2017-09-29 DIAGNOSIS — D500 Iron deficiency anemia secondary to blood loss (chronic): Secondary | ICD-10-CM

## 2017-09-29 DIAGNOSIS — C884 Extranodal marginal zone B-cell lymphoma of mucosa-associated lymphoid tissue [MALT-lymphoma]: Secondary | ICD-10-CM | POA: Insufficient documentation

## 2017-09-29 MED ORDER — SODIUM CHLORIDE 0.9 % IV SOLN
510.0000 mg | Freq: Once | INTRAVENOUS | Status: AC
  Administered 2017-09-29: 510 mg via INTRAVENOUS
  Filled 2017-09-29: qty 17

## 2017-09-29 MED ORDER — HEPARIN SOD (PORK) LOCK FLUSH 100 UNIT/ML IV SOLN
500.0000 [IU] | Freq: Once | INTRAVENOUS | Status: AC | PRN
  Administered 2017-09-29: 500 [IU] via INTRAVENOUS
  Filled 2017-09-29: qty 5

## 2017-09-29 MED ORDER — SODIUM CHLORIDE 0.9% FLUSH
10.0000 mL | INTRAVENOUS | Status: DC | PRN
  Administered 2017-09-29: 10 mL via INTRAVENOUS
  Filled 2017-09-29: qty 10

## 2017-09-29 MED ORDER — SODIUM CHLORIDE 0.9 % IV SOLN
Freq: Once | INTRAVENOUS | Status: AC
  Administered 2017-09-29: 10:00:00 via INTRAVENOUS
  Filled 2017-09-29: qty 250

## 2017-09-29 NOTE — Patient Instructions (Signed)

## 2017-10-03 ENCOUNTER — Other Ambulatory Visit (HOSPITAL_COMMUNITY): Payer: Medicare Other

## 2017-10-03 ENCOUNTER — Ambulatory Visit (HOSPITAL_COMMUNITY)
Admission: RE | Admit: 2017-10-03 | Discharge: 2017-10-03 | Disposition: A | Discharge status: Home / Self Care | Payer: Medicare Other | Source: Ambulatory Visit | Attending: Hematology & Oncology | Admitting: Hematology & Oncology

## 2017-10-03 ENCOUNTER — Telehealth: Payer: Self-pay | Admitting: *Deleted

## 2017-10-03 DIAGNOSIS — I700 Atherosclerosis of aorta: Secondary | ICD-10-CM | POA: Insufficient documentation

## 2017-10-03 DIAGNOSIS — I517 Cardiomegaly: Secondary | ICD-10-CM | POA: Insufficient documentation

## 2017-10-03 DIAGNOSIS — K5730 Diverticulosis of large intestine without perforation or abscess without bleeding: Secondary | ICD-10-CM | POA: Insufficient documentation

## 2017-10-03 DIAGNOSIS — D259 Leiomyoma of uterus, unspecified: Secondary | ICD-10-CM | POA: Insufficient documentation

## 2017-10-03 DIAGNOSIS — R918 Other nonspecific abnormal finding of lung field: Secondary | ICD-10-CM | POA: Diagnosis not present

## 2017-10-03 DIAGNOSIS — I2510 Atherosclerotic heart disease of native coronary artery without angina pectoris: Secondary | ICD-10-CM | POA: Diagnosis not present

## 2017-10-03 DIAGNOSIS — C8307 Small cell B-cell lymphoma, spleen: Secondary | ICD-10-CM

## 2017-10-03 DIAGNOSIS — R9389 Abnormal findings on diagnostic imaging of other specified body structures: Secondary | ICD-10-CM | POA: Insufficient documentation

## 2017-10-03 DIAGNOSIS — M47815 Spondylosis without myelopathy or radiculopathy, thoracolumbar region: Secondary | ICD-10-CM | POA: Diagnosis not present

## 2017-10-03 LAB — GLUCOSE, CAPILLARY: Glucose-Capillary: 113 mg/dL — ABNORMAL HIGH (ref 70–99)

## 2017-10-03 MED ORDER — FLUDEOXYGLUCOSE F - 18 (FDG) INJECTION
8.3000 | Freq: Once | INTRAVENOUS | Status: AC | PRN
  Administered 2017-10-03: 8.3 via INTRAVENOUS

## 2017-10-03 NOTE — Telephone Encounter (Addendum)
Patient is aware of results.   ----- Message from [name], MD sent at 10/03/2017 12:32 PM EDT ----- Call - PET scan does not show any obvious lymphoma!!  Kristine Sullivan

## 2017-12-20 ENCOUNTER — Other Ambulatory Visit: Payer: Self-pay | Admitting: Family

## 2017-12-20 ENCOUNTER — Other Ambulatory Visit: Payer: Self-pay | Admitting: Hematology & Oncology

## 2017-12-20 DIAGNOSIS — C8307 Small cell B-cell lymphoma, spleen: Secondary | ICD-10-CM

## 2017-12-21 ENCOUNTER — Other Ambulatory Visit: Payer: Medicare Other

## 2017-12-21 ENCOUNTER — Ambulatory Visit (HOSPITAL_BASED_OUTPATIENT_CLINIC_OR_DEPARTMENT_OTHER)
Admission: RE | Admit: 2017-12-21 | Discharge: 2017-12-21 | Disposition: A | Discharge status: Home / Self Care | Payer: Medicare Other | Source: Ambulatory Visit | Attending: Hematology & Oncology | Admitting: Hematology & Oncology

## 2017-12-21 ENCOUNTER — Encounter (HOSPITAL_BASED_OUTPATIENT_CLINIC_OR_DEPARTMENT_OTHER): Payer: Self-pay

## 2017-12-21 ENCOUNTER — Encounter: Payer: Self-pay | Admitting: Hematology & Oncology

## 2017-12-21 ENCOUNTER — Inpatient Hospital Stay: Payer: Medicare Other

## 2017-12-21 ENCOUNTER — Ambulatory Visit: Payer: Medicare Other | Admitting: Hematology & Oncology

## 2017-12-21 ENCOUNTER — Other Ambulatory Visit: Payer: Self-pay

## 2017-12-21 ENCOUNTER — Inpatient Hospital Stay: Payer: Medicare Other | Attending: Hematology & Oncology | Admitting: Hematology & Oncology

## 2017-12-21 VITALS — BP 160/79 | HR 60 | Temp 97.8°F | Resp 18 | Wt 170.0 lb

## 2017-12-21 DIAGNOSIS — Z7901 Long term (current) use of anticoagulants: Secondary | ICD-10-CM

## 2017-12-21 DIAGNOSIS — D591 Other autoimmune hemolytic anemias: Secondary | ICD-10-CM | POA: Diagnosis present

## 2017-12-21 DIAGNOSIS — D500 Iron deficiency anemia secondary to blood loss (chronic): Secondary | ICD-10-CM

## 2017-12-21 DIAGNOSIS — Z7984 Long term (current) use of oral hypoglycemic drugs: Secondary | ICD-10-CM | POA: Diagnosis not present

## 2017-12-21 DIAGNOSIS — Z79899 Other long term (current) drug therapy: Secondary | ICD-10-CM

## 2017-12-21 DIAGNOSIS — C884 Extranodal marginal zone B-cell lymphoma of mucosa-associated lymphoid tissue [MALT-lymphoma]: Secondary | ICD-10-CM | POA: Diagnosis not present

## 2017-12-21 DIAGNOSIS — R918 Other nonspecific abnormal finding of lung field: Secondary | ICD-10-CM | POA: Diagnosis not present

## 2017-12-21 DIAGNOSIS — Z9221 Personal history of antineoplastic chemotherapy: Secondary | ICD-10-CM

## 2017-12-21 DIAGNOSIS — D589 Hereditary hemolytic anemia, unspecified: Secondary | ICD-10-CM | POA: Diagnosis not present

## 2017-12-21 DIAGNOSIS — I700 Atherosclerosis of aorta: Secondary | ICD-10-CM | POA: Insufficient documentation

## 2017-12-21 DIAGNOSIS — Z7982 Long term (current) use of aspirin: Secondary | ICD-10-CM | POA: Diagnosis not present

## 2017-12-21 DIAGNOSIS — C8307 Small cell B-cell lymphoma, spleen: Secondary | ICD-10-CM

## 2017-12-21 DIAGNOSIS — I2510 Atherosclerotic heart disease of native coronary artery without angina pectoris: Secondary | ICD-10-CM | POA: Diagnosis not present

## 2017-12-21 LAB — CMP (CANCER CENTER ONLY)
ALBUMIN: 3.8 g/dL (ref 3.5–5.0)
ALK PHOS: 128 U/L — AB (ref 26–84)
ALT: 25 U/L (ref 10–47)
AST: 28 U/L (ref 11–38)
Anion gap: 7 (ref 5–15)
BUN: 17 mg/dL (ref 7–22)
CALCIUM: 8.8 mg/dL (ref 8.0–10.3)
CHLORIDE: 107 mmol/L (ref 98–108)
CO2: 30 mmol/L (ref 18–33)
CREATININE: 0.7 mg/dL (ref 0.60–1.20)
Glucose, Bld: 112 mg/dL (ref 73–118)
Potassium: 4.8 mmol/L — ABNORMAL HIGH (ref 3.3–4.7)
SODIUM: 144 mmol/L (ref 128–145)
TOTAL PROTEIN: 6.4 g/dL (ref 6.4–8.1)
Total Bilirubin: 0.9 mg/dL (ref 0.2–1.6)

## 2017-12-21 LAB — CBC WITH DIFFERENTIAL (CANCER CENTER ONLY)
ABS IMMATURE GRANULOCYTES: 0.02 10*3/uL (ref 0.00–0.07)
BASOS ABS: 0 10*3/uL (ref 0.0–0.1)
Basophils Relative: 1 %
Eosinophils Absolute: 0.1 10*3/uL (ref 0.0–0.5)
Eosinophils Relative: 2 %
HEMATOCRIT: 43 % (ref 36.0–46.0)
HEMOGLOBIN: 12.9 g/dL (ref 12.0–15.0)
Immature Granulocytes: 1 %
Lymphocytes Relative: 17 %
Lymphs Abs: 0.7 10*3/uL (ref 0.7–4.0)
MCH: 25.5 pg — ABNORMAL LOW (ref 26.0–34.0)
MCHC: 30 g/dL (ref 30.0–36.0)
MCV: 85 fL (ref 80.0–100.0)
Monocytes Absolute: 0.4 10*3/uL (ref 0.1–1.0)
Monocytes Relative: 8 %
Neutro Abs: 3 10*3/uL (ref 1.7–7.7)
Neutrophils Relative %: 71 %
Platelet Count: 142 10*3/uL — ABNORMAL LOW (ref 150–400)
RBC: 5.06 MIL/uL (ref 3.87–5.11)
RDW: 14.8 % (ref 11.5–15.5)
WBC: 4.2 10*3/uL (ref 4.0–10.5)
nRBC: 0 % (ref 0.0–0.2)

## 2017-12-21 MED ORDER — HEPARIN SOD (PORK) LOCK FLUSH 100 UNIT/ML IV SOLN
500.0000 [IU] | Freq: Once | INTRAVENOUS | Status: DC | PRN
  Administered 2017-12-21: 500 [IU] via INTRAVENOUS
  Filled 2017-12-21: qty 5

## 2017-12-21 MED ORDER — SODIUM CHLORIDE 0.9% FLUSH
10.0000 mL | INTRAVENOUS | Status: DC | PRN
  Filled 2017-12-21: qty 10

## 2017-12-21 MED ORDER — SODIUM CHLORIDE 0.9% FLUSH
10.0000 mL | INTRAVENOUS | Status: AC | PRN
  Administered 2017-12-21: 10 mL via INTRAVENOUS
  Filled 2017-12-21: qty 10

## 2017-12-21 MED ORDER — IOPAMIDOL (ISOVUE-300) INJECTION 61%
100.0000 mL | Freq: Once | INTRAVENOUS | Status: AC | PRN
  Administered 2017-12-21: 80 mL via INTRAVENOUS

## 2017-12-21 MED ORDER — DEXAMETHASONE SODIUM PHOSPHATE 10 MG/ML IJ SOLN: 20.0000 mg | Freq: Once | INTRAMUSCULAR | Status: DC

## 2017-12-21 NOTE — Progress Notes (Signed)
Hematology and Oncology Follow Up Visit  Kristine Sullivan [id number] Aug 18, 1931 84 y.o. 12/21/2017   Principle Diagnosis:  Marginal zone lymphoma Iron deficiency anemia  Current Therapy:  CVP I think completed 8 cycles on 04/09/2012  Maintenance Rituxan - 2 years completed in June 2016 IV iron - last dose on 09/2017    Interim History:  [name] is here today for follow-up.  She is doing okay.  She feels okay.  She did have a PET scan done back in August.  The PET scan was not conclusive, by any means, of recurrent disease.  She was scheduled for a CT scan today.  We do not have the results back.  She has had no problems with the angioedema.  I am not convinced that this was ever an issue for her.  She did get iron back in the summertime.  Back in July, her ferritin was 51 with iron saturation of 9%.  We did go and give her a dose of IV iron.  She has had no problems with nausea or vomiting.  There is been no fever.  She is had no rashes.  She is had no leg swelling.  Overall, her performance status is ECOG 1.     Medications:  Allergies as of 12/21/2017      Reactions   Lisinopril Swelling   Facial swelling  Facial swelling    Timolol Other (See Comments)   Pregabalin Other (See Comments)   constipation constipation      Medication List        Accurate as of 12/21/17  2:26 PM. Always use your most recent med list.          apixaban 5 MG Tabs tablet Commonly known as:  ELIQUIS Take 5 mg by mouth.   aspirin 81 MG tablet Take 81 mg by mouth daily.   atorvastatin 80 MG tablet Commonly known as:  LIPITOR Take 40 mg by mouth daily.   CALCIUM 600+D PLUS MINERALS 600-400 MG-UNIT Tabs Take by mouth.   CALCIUM 600-D 600-400 MG-UNIT Tabs Generic drug:  Calcium Carbonate-Vitamin D3 Take by mouth.   diphenhydrAMINE 25 MG tablet Commonly known as:  BENADRYL Take 25 mg by mouth.   ferrous sulfate 325 (65 FE) MG tablet Take 325 mg by mouth daily.     furosemide 40 MG tablet Commonly known as:  LASIX Take 40 mg by mouth daily.   gabapentin 300 MG capsule Commonly known as:  NEURONTIN Take 1 capsule (300 mg total) by mouth at bedtime. 90 day supply   GLUCOCOM BLOOD GLUCOSE MONITOR Devi USE TO CHECK BLOOD SUGAR EVERY MORNING FASTING - E11.49   ipratropium-albuterol 0.5-2.5 (3) MG/3ML Soln Commonly known as:  DUONEB INHALE 3 ML BY NEBULIZATION EVERY SIX (6) HOURS AS NEEDED.   latanoprost 0.005 % ophthalmic solution Commonly known as:  XALATAN   lidocaine-prilocaine cream Commonly known as:  EMLA Apply 1 application topically as needed.   loratadine 10 MG tablet Commonly known as:  CLARITIN OTC Take by mouth one a day for 10 days, then once a day as needed -allergies.   metFORMIN 500 MG tablet Commonly known as:  GLUCOPHAGE Take 500 mg by mouth daily with breakfast.   potassium chloride 10 MEQ tablet Commonly known as:  K-DUR,KLOR-CON Take 10 mEq by mouth daily.   Propylene Glycol 0.6 % Soln 1 drop.   raloxifene 60 MG tablet Commonly known as:  EVISTA Take 60 mg by mouth daily.   ranitidine 150 MG  tablet Commonly known as:  ZANTAC 1 tablet by mouth twice a day for month and then take twice a day for needed - GERD   tiZANidine 4 MG capsule Commonly known as:  ZANAFLEX Take 4 mg by mouth at bedtime.   Vitamin D (Ergocalciferol) 50000 units Caps capsule Commonly known as:  DRISDOL TAKE ONE CAPSULE BY MOUTH 3 DAYS WEEK WITH A MEAL FOR LOW VITAMIN D       Allergies:  Allergies  Allergen Reactions  . Lisinopril Swelling    Facial swelling  Facial swelling    . Timolol Other (See Comments)  . Pregabalin Other (See Comments)    constipation constipation     Past Medical History, Surgical history, Social history, and Family History were reviewed and updated.  Review of Systems: Review of Systems  Constitutional: Negative.   HENT: Negative.   Eyes: Negative.   Respiratory: Negative.    Cardiovascular: Negative.   Gastrointestinal: Negative.   Genitourinary: Negative.   Musculoskeletal: Negative.   Skin: Negative.   Neurological: Negative.   Endo/Heme/Allergies: Negative.   Psychiatric/Behavioral: Negative.      Physical Exam:  weight is 170 lb (77.1 kg). Her oral temperature is 97.8 F (36.6 C). Her blood pressure is 160/79 (abnormal) and her pulse is 60. Her respiration is 18 and oxygen saturation is 96%.   Wt Readings from Last 3 Encounters:  12/21/17 170 lb (77.1 kg)  09/14/17 166 lb (75.3 kg)  03/17/17 177 lb (80.3 kg)    Physical Exam  Constitutional: She is oriented to person, place, and time.  HENT:  Head: Normocephalic and atraumatic.  Mouth/Throat: Oropharynx is clear and moist.  Eyes: Pupils are equal, round, and reactive to light. EOM are normal.  Neck: Normal range of motion.  Cardiovascular: Normal rate, regular rhythm and normal heart sounds.  Pulmonary/Chest: Effort normal and breath sounds normal.  Abdominal: Soft. Bowel sounds are normal.  Musculoskeletal: Normal range of motion. She exhibits no edema, tenderness or deformity.  Lymphadenopathy:    She has no cervical adenopathy.  Neurological: She is alert and oriented to person, place, and time.  Skin: Skin is warm and dry. No rash noted. No erythema.  Psychiatric: She has a normal mood and affect. Her behavior is normal. Judgment and thought content normal.  Vitals reviewed.   Lab Results  Component Value Date   WBC 4.2 12/21/2017   HGB 12.9 12/21/2017   HCT 43.0 12/21/2017   MCV 85.0 12/21/2017   PLT 142 (L) 12/21/2017   Lab Results  Component Value Date   FERRITIN 51 09/14/2017   IRON 38 (L) 09/14/2017   TIBC 411 09/14/2017   UIBC 373 09/14/2017   IRONPCTSAT 9 (L) 09/14/2017   Lab Results  Component Value Date   RETICCTPCT 1.7 09/14/2017   RBC 5.06 12/21/2017   RETICCTABS 59.2 11/13/2013   No results found for: KPAFRELGTCHN, LAMBDASER, KAPLAMBRATIO Lab Results   Component Value Date   IGGSERUM 557 (L) 06/19/2013   IGA 100 06/19/2013   IGMSERUM 95 06/19/2013   Lab Results  Component Value Date   TOTALPROTELP 6.3 09/16/2011   ALBUMINELP 59.9 09/16/2011   A1GS 7.3 (H) 09/16/2011   A2GS 9.9 09/16/2011   BETS 8.4 (H) 09/16/2011   BETA2SER 4.8 09/16/2011   GAMS 9.7 (L) 09/16/2011   MSPIKE NOT DET 09/16/2011   SPEI * 09/16/2011     Chemistry      Component Value Date/Time   NA 144 12/21/2017 1032   NA  140 09/16/2016 0945   K 4.8 (H) 12/21/2017 1032   K 3.2 (L) 09/16/2016 0945   CL 107 12/21/2017 1032   CL 101 12/08/2014 1138   CL 99 05/28/2012 1036   CO2 30 12/21/2017 1032   CO2 26 09/16/2016 0945   BUN 17 12/21/2017 1032   BUN 9.0 09/16/2016 0945   CREATININE 0.70 12/21/2017 1032   CREATININE 0.7 09/16/2016 0945      Component Value Date/Time   CALCIUM 8.8 12/21/2017 1032   CALCIUM 9.0 09/16/2016 0945   ALKPHOS 128 (H) 12/21/2017 1032   ALKPHOS 122 09/16/2016 0945   AST 28 12/21/2017 1032   AST 25 09/16/2016 0945   ALT 25 12/21/2017 1032   ALT 15 09/16/2016 0945   BILITOT 0.9 12/21/2017 1032   BILITOT 0.90 09/16/2016 0945     Impression and Plan: Ms. Guettler is a very pleasant 84 yo African American female with a history of marginal zone lymphoma and autoimmune hemolytic anemia. She was treated with systemic chemotherapy and has remained in remission.   We will have to await the results of her CT scan.  Again, I just do not think that there is any problems with recurrent lymphoma.  I would think if she had recurrent lymphoma, then she would have her hemolytic anemia.  Because she is doing so well, I think we can probably wait until the spring time that we see her back.  I think it would be reasonable to get her through the wintertime so she does not have to come here in bad weather.[name], MD 10/31/20192:26 PM

## 2017-12-22 ENCOUNTER — Telehealth: Payer: Self-pay | Admitting: *Deleted

## 2017-12-22 NOTE — Telephone Encounter (Signed)
Notified pt of CT results.  Pt thanked me for the call.

## 2017-12-22 NOTE — Telephone Encounter (Signed)
-----   Message from [name], MD sent at 12/21/2017  4:35 PM EDT ----- Call - the CT scan still shows very small lung nodules in the lungs.  They do not look like cancer!!!  Laurey Arrow

## 2018-02-22 ENCOUNTER — Inpatient Hospital Stay: Payer: Medicare Other | Attending: Hematology & Oncology

## 2018-02-22 DIAGNOSIS — Z452 Encounter for adjustment and management of vascular access device: Secondary | ICD-10-CM | POA: Insufficient documentation

## 2018-02-22 DIAGNOSIS — C884 Extranodal marginal zone B-cell lymphoma of mucosa-associated lymphoid tissue [MALT-lymphoma]: Secondary | ICD-10-CM | POA: Diagnosis not present

## 2018-02-22 DIAGNOSIS — D589 Hereditary hemolytic anemia, unspecified: Secondary | ICD-10-CM

## 2018-02-22 DIAGNOSIS — D500 Iron deficiency anemia secondary to blood loss (chronic): Secondary | ICD-10-CM

## 2018-02-22 MED ORDER — HEPARIN SOD (PORK) LOCK FLUSH 100 UNIT/ML IV SOLN
500.0000 [IU] | Freq: Once | INTRAVENOUS | Status: AC | PRN
  Administered 2018-02-22: 500 [IU] via INTRAVENOUS
  Filled 2018-02-22: qty 5

## 2018-02-22 MED ORDER — SODIUM CHLORIDE 0.9% FLUSH
10.0000 mL | INTRAVENOUS | Status: DC | PRN
  Administered 2018-02-22: 10 mL via INTRAVENOUS
  Filled 2018-02-22: qty 10

## 2018-04-26 ENCOUNTER — Inpatient Hospital Stay: Payer: Medicare Other | Attending: Hematology & Oncology

## 2018-04-26 VITALS — BP 148/47 | HR 65 | Temp 97.8°F | Resp 20

## 2018-04-26 DIAGNOSIS — C884 Extranodal marginal zone B-cell lymphoma of mucosa-associated lymphoid tissue [MALT-lymphoma]: Secondary | ICD-10-CM | POA: Diagnosis not present

## 2018-04-26 DIAGNOSIS — Z95828 Presence of other vascular implants and grafts: Secondary | ICD-10-CM

## 2018-04-26 DIAGNOSIS — Z452 Encounter for adjustment and management of vascular access device: Secondary | ICD-10-CM | POA: Diagnosis present

## 2018-04-26 MED ORDER — CHOLECALCIFEROL 25 MCG (1000 UT) PO TABS
1000.00 | ORAL_TABLET | ORAL | Status: DC
Start: 2018-04-26 — End: 2018-04-26

## 2018-04-26 MED ORDER — GENERIC EXTERNAL MEDICATION
50.00 | Status: DC
Start: ? — End: 2018-04-26

## 2018-04-26 MED ORDER — APIXABAN 5 MG PO TABS
5.00 | ORAL_TABLET | ORAL | Status: DC
Start: 2018-04-25 — End: 2018-04-26

## 2018-04-26 MED ORDER — GENERIC EXTERNAL MEDICATION
2.00 | Status: DC
Start: 2018-04-26 — End: 2018-04-26

## 2018-04-26 MED ORDER — ATORVASTATIN CALCIUM 40 MG PO TABS
80.00 | ORAL_TABLET | ORAL | Status: DC
Start: 2018-04-25 — End: 2018-04-26

## 2018-04-26 MED ORDER — INSULIN LISPRO 100 UNIT/ML ~~LOC~~ SOLN
2.00 | SUBCUTANEOUS | Status: DC
Start: 2018-04-25 — End: 2018-04-26

## 2018-04-26 MED ORDER — FERROUS SULFATE 325 (65 FE) MG PO TABS
325.00 | ORAL_TABLET | ORAL | Status: DC
Start: 2018-04-26 — End: 2018-04-26

## 2018-04-26 MED ORDER — TIZANIDINE HCL 2 MG PO TABS
4.00 | ORAL_TABLET | ORAL | Status: DC
Start: ? — End: 2018-04-26

## 2018-04-26 MED ORDER — DEXTROSE 10 % IV SOLN
125.00 | INTRAVENOUS | Status: DC
Start: ? — End: 2018-04-26

## 2018-04-26 MED ORDER — IPRATROPIUM-ALBUTEROL 0.5-2.5 (3) MG/3ML IN SOLN
3.00 | RESPIRATORY_TRACT | Status: DC
Start: ? — End: 2018-04-26

## 2018-04-26 MED ORDER — GENERIC EXTERNAL MEDICATION
17.00 | Status: DC
Start: ? — End: 2018-04-26

## 2018-04-26 MED ORDER — SODIUM CHLORIDE 0.9% FLUSH
10.0000 mL | INTRAVENOUS | Status: DC | PRN
  Administered 2018-04-26: 10 mL via INTRAVENOUS
  Filled 2018-04-26: qty 10

## 2018-04-26 MED ORDER — FUROSEMIDE 40 MG PO TABS
40.00 | ORAL_TABLET | ORAL | Status: DC
Start: 2018-04-26 — End: 2018-04-26

## 2018-04-26 MED ORDER — ACETAMINOPHEN 325 MG PO TABS
650.00 | ORAL_TABLET | ORAL | Status: DC
Start: ? — End: 2018-04-26

## 2018-04-26 MED ORDER — HEPARIN SOD (PORK) LOCK FLUSH 100 UNIT/ML IV SOLN
500.0000 [IU] | Freq: Once | INTRAVENOUS | Status: AC
  Administered 2018-04-26: 500 [IU] via INTRAVENOUS
  Filled 2018-04-26: qty 5

## 2018-04-26 NOTE — Patient Instructions (Signed)
Implanted Port Insertion, Care After  This sheet gives you information about how to care for yourself after your procedure. Your health care provider may also give you more specific instructions. If you have problems or questions, contact your health care provider.  What can I expect after the procedure?  After the procedure, it is common to have:  · Discomfort at the port insertion site.  · Bruising on the skin over the port. This should improve over 3-4 days.  Follow these instructions at home:  Port care  · After your port is placed, you will get a manufacturer's information card. The card has information about your port. Keep this card with you at all times.  · Take care of the port as told by your health care provider. Ask your health care provider if you or a family member can get training for taking care of the port at home. A home health care nurse may also take care of the port.  · Make sure to remember what type of port you have.  Incision care         · Follow instructions from your health care provider about how to take care of your port insertion site. Make sure you:  ? Wash your hands with soap and water before and after you change your bandage (dressing). If soap and water are not available, use hand sanitizer.  ? Change your dressing as told by your health care provider.  ? Leave stitches (sutures), skin glue, or adhesive strips in place. These skin closures may need to stay in place for 2 weeks or longer. If adhesive strip edges start to loosen and curl up, you may trim the loose edges. Do not remove adhesive strips completely unless your health care provider tells you to do that.  · Check your port insertion site every day for signs of infection. Check for:  ? Redness, swelling, or pain.  ? Fluid or blood.  ? Warmth.  ? Pus or a bad smell.  Activity  · Return to your normal activities as told by your health care provider. Ask your health care provider what activities are safe for you.  · Do not  lift anything that is heavier than 10 lb (4.5 kg), or the limit that you are told, until your health care provider says that it is safe.  General instructions  · Take over-the-counter and prescription medicines only as told by your health care provider.  · Do not take baths, swim, or use a hot tub until your health care provider approves. Ask your health care provider if you may take showers. You may only be allowed to take sponge baths.  · Do not drive for 24 hours if you were given a sedative during your procedure.  · Wear a medical alert bracelet in case of an emergency. This will tell any health care providers that you have a port.  · Keep all follow-up visits as told by your health care provider. This is important.  Contact a health care provider if:  · You cannot flush your port with saline as directed, or you cannot draw blood from the port.  · You have a fever or chills.  · You have redness, swelling, or pain around your port insertion site.  · You have fluid or blood coming from your port insertion site.  · Your port insertion site feels warm to the touch.  · You have pus or a bad smell coming from the port   insertion site.  Get help right away if:  · You have chest pain or shortness of breath.  · You have bleeding from your port that you cannot control.  Summary  · Take care of the port as told by your health care provider. Keep the manufacturer's information card with you at all times.  · Change your dressing as told by your health care provider.  · Contact a health care provider if you have a fever or chills or if you have redness, swelling, or pain around your port insertion site.  · Keep all follow-up visits as told by your health care provider.  This information is not intended to replace advice given to you by your health care provider. Make sure you discuss any questions you have with your health care provider.  Document Released: 11/28/2012 Document Revised: 09/05/2017 Document Reviewed:  09/05/2017  [employer] Interactive Patient Education © 2019 [employer]

## 2018-06-28 ENCOUNTER — Other Ambulatory Visit: Payer: Self-pay

## 2018-06-28 ENCOUNTER — Encounter: Payer: Self-pay | Admitting: Hematology & Oncology

## 2018-06-28 ENCOUNTER — Inpatient Hospital Stay: Payer: Medicare Other

## 2018-06-28 ENCOUNTER — Inpatient Hospital Stay: Payer: Medicare Other | Attending: Hematology & Oncology | Admitting: Hematology & Oncology

## 2018-06-28 ENCOUNTER — Telehealth: Payer: Self-pay | Admitting: Hematology & Oncology

## 2018-06-28 VITALS — BP 160/75 | HR 78 | Temp 98.6°F | Resp 18 | Wt 171.0 lb

## 2018-06-28 DIAGNOSIS — D589 Hereditary hemolytic anemia, unspecified: Secondary | ICD-10-CM

## 2018-06-28 DIAGNOSIS — C884 Extranodal marginal zone B-cell lymphoma of mucosa-associated lymphoid tissue [MALT-lymphoma]: Secondary | ICD-10-CM

## 2018-06-28 DIAGNOSIS — D509 Iron deficiency anemia, unspecified: Secondary | ICD-10-CM | POA: Insufficient documentation

## 2018-06-28 DIAGNOSIS — D500 Iron deficiency anemia secondary to blood loss (chronic): Secondary | ICD-10-CM

## 2018-06-28 DIAGNOSIS — R911 Solitary pulmonary nodule: Secondary | ICD-10-CM | POA: Diagnosis not present

## 2018-06-28 DIAGNOSIS — D591 Other autoimmune hemolytic anemias: Secondary | ICD-10-CM | POA: Diagnosis not present

## 2018-06-28 DIAGNOSIS — Z9221 Personal history of antineoplastic chemotherapy: Secondary | ICD-10-CM | POA: Diagnosis not present

## 2018-06-28 LAB — CBC WITH DIFFERENTIAL (CANCER CENTER ONLY)
Abs Immature Granulocytes: 0.03 10*3/uL (ref 0.00–0.07)
Basophils Absolute: 0 10*3/uL (ref 0.0–0.1)
Basophils Relative: 1 %
Eosinophils Absolute: 0.1 10*3/uL (ref 0.0–0.5)
Eosinophils Relative: 2 %
HCT: 38.9 % (ref 36.0–46.0)
Hemoglobin: 11.9 g/dL — ABNORMAL LOW (ref 12.0–15.0)
Immature Granulocytes: 1 %
Lymphocytes Relative: 13 %
Lymphs Abs: 0.5 10*3/uL — ABNORMAL LOW (ref 0.7–4.0)
MCH: 25.6 pg — ABNORMAL LOW (ref 26.0–34.0)
MCHC: 30.6 g/dL (ref 30.0–36.0)
MCV: 83.8 fL (ref 80.0–100.0)
Monocytes Absolute: 0.3 10*3/uL (ref 0.1–1.0)
Monocytes Relative: 7 %
Neutro Abs: 3.2 10*3/uL (ref 1.7–7.7)
Neutrophils Relative %: 76 %
Platelet Count: 146 10*3/uL — ABNORMAL LOW (ref 150–400)
RBC: 4.64 MIL/uL (ref 3.87–5.11)
RDW: 14.1 % (ref 11.5–15.5)
WBC Count: 4.2 10*3/uL (ref 4.0–10.5)
nRBC: 0 % (ref 0.0–0.2)

## 2018-06-28 LAB — CMP (CANCER CENTER ONLY)
ALT: 10 U/L (ref 0–44)
AST: 18 U/L (ref 15–41)
Albumin: 4 g/dL (ref 3.5–5.0)
Alkaline Phosphatase: 114 U/L (ref 38–126)
Anion gap: 7 (ref 5–15)
BUN: 13 mg/dL (ref 8–23)
CO2: 28 mmol/L (ref 22–32)
Calcium: 9.1 mg/dL (ref 8.9–10.3)
Chloride: 106 mmol/L (ref 98–111)
Creatinine: 0.7 mg/dL (ref 0.44–1.00)
GFR, Est AFR Am: >60 mL/min (ref >60)
GFR, Estimated: >60 mL/min (ref >60)
Glucose, Bld: 110 mg/dL — ABNORMAL HIGH (ref 70–99)
Potassium: 4 mmol/L (ref 3.5–5.1)
Sodium: 141 mmol/L (ref 135–145)
Total Bilirubin: 0.9 mg/dL (ref 0.3–1.2)
Total Protein: 6.3 g/dL — ABNORMAL LOW (ref 6.5–8.1)

## 2018-06-28 LAB — LACTATE DEHYDROGENASE: LDH: 285 U/L — ABNORMAL HIGH (ref 98–192)

## 2018-06-28 LAB — RETICULOCYTES
Immature Retic Fract: 15.5 % (ref 2.3–15.9)
RBC.: 4.62 MIL/uL (ref 3.87–5.11)
Retic Count, Absolute: 73.5 10*3/uL (ref 19.0–186.0)
Retic Ct Pct: 1.6 % (ref 0.4–3.1)

## 2018-06-28 NOTE — Progress Notes (Signed)
Hematology and Oncology Follow Up Visit  Kristine Sullivan [id number] [birth year] 85 y.o. 06/28/2018   Principle Diagnosis:  Marginal zone lymphoma Iron deficiency anemia  Current Therapy:  CVP I think completed 8 cycles on 04/09/2012  Maintenance Rituxan - 2 years completed in June 2016 IV iron - last dose on 09/2017    Interim History:  [name] is here today for follow-up.  She is doing okay.  She really has no specific complaints.  The thing that we have to watch out for is that she had her last CT scan done back in October 2019.  There was some small pulmonary nodules.  I am not sure exactly what they are but we need to follow-up with this.  She is had no cough.  There is been no fever.  She has had no nausea or vomiting.  She is watching her blood sugars.  She has had no leg swelling.  There is been no bleeding.  Overall, her performance status is ECOG 1.  Medications:  Allergies as of 06/28/2018      Reactions   Lisinopril Swelling   Facial swelling  Facial swelling    Timolol Other (See Comments), Swelling   Pregabalin Other (See Comments)   constipation constipation constipation      Medication List       Accurate as of Jun 28, 2018 12:30 PM. If you have any questions, ask your nurse or doctor.        STOP taking these medications   Calcium 600+D Plus Minerals 600-400 MG-UNIT Tabs Stopped by:  [name], MD   GlucoCom Blood Glucose Monitor Devi Stopped by:  [name], MD     TAKE these medications   amLODipine 5 MG tablet Commonly known as:  NORVASC Take 5 mg by mouth daily.   apixaban 5 MG Tabs tablet Commonly known as:  ELIQUIS Take 5 mg by mouth.   aspirin 81 MG tablet Take 81 mg by mouth daily.   atorvastatin 80 MG tablet Commonly known as:  LIPITOR Take 40 mg by mouth daily.   Calcium 600-D 600-400 MG-UNIT Tabs Generic drug:  Calcium Carbonate-Vitamin D3 Take by mouth.   diphenhydrAMINE 25 MG tablet Commonly known as:   BENADRYL Take 25 mg by mouth.   dorzolamide 2 % ophthalmic solution Commonly known as:  TRUSOPT Place 1 drop into both eyes 3 (three) times daily.   ferrous sulfate 325 (65 FE) MG tablet Take 325 mg by mouth daily.   furosemide 40 MG tablet Commonly known as:  LASIX Take 40 mg by mouth daily.   gabapentin 300 MG capsule Commonly known as:  NEURONTIN Take 1 capsule (300 mg total) by mouth at bedtime. 90 day supply   ipratropium-albuterol 0.5-2.5 (3) MG/3ML Soln Commonly known as:  DUONEB INHALE 3 ML BY NEBULIZATION EVERY SIX (6) HOURS AS NEEDED.   latanoprost 0.005 % ophthalmic solution Commonly known as:  XALATAN   lidocaine-prilocaine cream Commonly known as:  EMLA Apply 1 application topically as needed.   loratadine 10 MG tablet Commonly known as:  CLARITIN OTC Take by mouth one a day for 10 days, then once a day as needed -allergies.   metFORMIN 500 MG tablet Commonly known as:  GLUCOPHAGE Take 500 mg by mouth daily with breakfast.   potassium chloride 10 MEQ tablet Commonly known as:  K-DUR Take 10 mEq by mouth daily.   Propylene Glycol 0.6 % Soln 1 drop.   raloxifene 60 MG tablet Commonly  known as:  EVISTA Take 60 mg by mouth daily.   ranitidine 150 MG tablet Commonly known as:  ZANTAC 1 tablet by mouth twice a day for month and then take twice a day for needed - GERD   tiZANidine 4 MG capsule Commonly known as:  ZANAFLEX Take 4 mg by mouth at bedtime.   Vitamin D (Ergocalciferol) 1.25 MG (50000 UT) Caps capsule Commonly known as:  DRISDOL TAKE ONE CAPSULE BY MOUTH 3 DAYS WEEK WITH A MEAL FOR LOW VITAMIN D       Allergies:  Allergies  Allergen Reactions  . Lisinopril Swelling    Facial swelling  Facial swelling    . Timolol Other (See Comments) and Swelling  . Pregabalin Other (See Comments)    constipation constipation  constipation    Past Medical History, Surgical history, Social history, and Family History were reviewed and  updated.  Review of Systems: Review of Systems  Constitutional: Negative.   HENT: Negative.   Eyes: Negative.   Respiratory: Negative.   Cardiovascular: Negative.   Gastrointestinal: Negative.   Genitourinary: Negative.   Musculoskeletal: Negative.   Skin: Negative.   Neurological: Negative.   Endo/Heme/Allergies: Negative.   Psychiatric/Behavioral: Negative.      Physical Exam:  weight is 171 lb (77.6 kg). Her oral temperature is 98.6 F (37 C). Her blood pressure is 160/75 (abnormal) and her pulse is 78. Her respiration is 18 and oxygen saturation is 96%.   Wt Readings from Last 3 Encounters:  06/28/18 171 lb (77.6 kg)  12/21/17 170 lb (77.1 kg)  09/14/17 166 lb (75.3 kg)    Physical Exam Vitals signs reviewed.  HENT:     Head: Normocephalic and atraumatic.  Eyes:     Pupils: Pupils are equal, round, and reactive to light.  Neck:     Musculoskeletal: Normal range of motion.  Cardiovascular:     Rate and Rhythm: Normal rate and regular rhythm.     Heart sounds: Normal heart sounds.  Pulmonary:     Effort: Pulmonary effort is normal.     Breath sounds: Normal breath sounds.  Abdominal:     General: Bowel sounds are normal.     Palpations: Abdomen is soft.  Musculoskeletal: Normal range of motion.        General: No tenderness or deformity.  Lymphadenopathy:     Cervical: No cervical adenopathy.  Skin:    General: Skin is warm and dry.     Findings: No erythema or rash.  Neurological:     Mental Status: She is alert and oriented to person, place, and time.  Psychiatric:        Behavior: Behavior normal.        Thought Content: Thought content normal.        Judgment: Judgment normal.     Lab Results  Component Value Date   WBC 4.2 06/28/2018   HGB 11.9 (L) 06/28/2018   HCT 38.9 06/28/2018   MCV 83.8 06/28/2018   PLT 146 (L) 06/28/2018   Lab Results  Component Value Date   FERRITIN 51 09/14/2017   IRON 38 (L) 09/14/2017   TIBC 411 09/14/2017    UIBC 373 09/14/2017   IRONPCTSAT 9 (L) 09/14/2017   Lab Results  Component Value Date   RETICCTPCT 1.6 06/28/2018   RBC 4.62 06/28/2018   RBC 4.64 06/28/2018   RETICCTABS 59.2 11/13/2013   No results found for: KPAFRELGTCHN, LAMBDASER, KAPLAMBRATIO Lab Results  Component Value Date  IGGSERUM 557 (L) 06/19/2013   IGA 100 06/19/2013   IGMSERUM 95 06/19/2013   Lab Results  Component Value Date   TOTALPROTELP 6.3 09/16/2011   ALBUMINELP 59.9 09/16/2011   A1GS 7.3 (H) 09/16/2011   A2GS 9.9 09/16/2011   BETS 8.4 (H) 09/16/2011   BETA2SER 4.8 09/16/2011   GAMS 9.7 (L) 09/16/2011   MSPIKE NOT DET 09/16/2011   SPEI * 09/16/2011     Chemistry      Component Value Date/Time   NA 141 06/28/2018 1130   NA 140 09/16/2016 0945   K 4.0 06/28/2018 1130   K 3.2 (L) 09/16/2016 0945   CL 106 06/28/2018 1130   CL 101 12/08/2014 1138   CL 99 05/28/2012 1036   CO2 28 06/28/2018 1130   CO2 26 09/16/2016 0945   BUN 13 06/28/2018 1130   BUN 9.0 09/16/2016 0945   CREATININE 0.70 06/28/2018 1130   CREATININE 0.7 09/16/2016 0945      Component Value Date/Time   CALCIUM 9.1 06/28/2018 1130   CALCIUM 9.0 09/16/2016 0945   ALKPHOS 114 06/28/2018 1130   ALKPHOS 122 09/16/2016 0945   AST 18 06/28/2018 1130   AST 25 09/16/2016 0945   ALT 10 06/28/2018 1130   ALT 15 09/16/2016 0945   BILITOT 0.9 06/28/2018 1130   BILITOT 0.90 09/16/2016 0945     Impression and Plan: Ms. Mathisen is a very pleasant 85 yo African American female with a history of marginal zone lymphoma and autoimmune hemolytic anemia. She was treated with systemic chemotherapy and has remained in remission.   We will have to get another CT scan on her.  I will set this up in about 4 weeks.  I will get her back in 6 weeks for follow-up.  [name], MD 5/7/202012:30 PM

## 2018-06-28 NOTE — Telephone Encounter (Signed)
lmom to inform  pt of 6/18 appt at 12 pm per 5/7 LOS

## 2018-06-28 NOTE — Patient Instructions (Signed)

## 2018-06-29 LAB — IRON AND TIBC
Iron: 54 ug/dL (ref 41–142)
Saturation Ratios: 15 % — ABNORMAL LOW (ref 21–57)
TIBC: 364 ug/dL (ref 236–444)
UIBC: 309 ug/dL (ref 120–384)

## 2018-06-29 LAB — FERRITIN: Ferritin: 95 ng/mL (ref 11–307)

## 2018-06-29 LAB — BETA 2 MICROGLOBULIN, SERUM: Beta-2 Microglobulin: 2.4 mg/L (ref 0.6–2.4)

## 2018-07-04 ENCOUNTER — Ambulatory Visit (HOSPITAL_BASED_OUTPATIENT_CLINIC_OR_DEPARTMENT_OTHER): Admission: RE | Admit: 2018-07-04 | Payer: Medicare Other | Source: Ambulatory Visit

## 2018-07-09 ENCOUNTER — Ambulatory Visit (HOSPITAL_BASED_OUTPATIENT_CLINIC_OR_DEPARTMENT_OTHER)
Admission: RE | Admit: 2018-07-09 | Discharge: 2018-07-09 | Disposition: A | Discharge status: Home / Self Care | Payer: Medicare Other | Source: Ambulatory Visit | Attending: Hematology & Oncology | Admitting: Hematology & Oncology

## 2018-07-09 ENCOUNTER — Other Ambulatory Visit: Payer: Self-pay

## 2018-07-09 DIAGNOSIS — R911 Solitary pulmonary nodule: Secondary | ICD-10-CM | POA: Insufficient documentation

## 2018-07-10 ENCOUNTER — Telehealth: Payer: Self-pay | Admitting: *Deleted

## 2018-07-10 NOTE — Telephone Encounter (Signed)
Patient notified per order of Dr. Marin Olp that the CT scan looks stable and that she will not need another one for 1 year.  Pt appreciative of call and has no questions or concerns at this time.

## 2018-07-10 NOTE — Telephone Encounter (Signed)
-----   Message from [name], MD sent at 07/10/2018  2:03 PM EDT ----- Call - the CT scan looks stable. Do not need another one for 1 year.  pete

## 2018-08-09 ENCOUNTER — Inpatient Hospital Stay: Payer: Medicare Other | Attending: Hematology & Oncology | Admitting: Hematology & Oncology

## 2018-08-09 ENCOUNTER — Inpatient Hospital Stay: Payer: Medicare Other

## 2018-09-03 ENCOUNTER — Other Ambulatory Visit: Payer: Self-pay | Admitting: Family

## 2018-09-03 ENCOUNTER — Telehealth: Payer: Self-pay | Admitting: *Deleted

## 2018-09-03 NOTE — Telephone Encounter (Signed)
Message received from patient to reschedule missed appts from 08/09/18.  Message sent to scheduling to call pt.

## 2018-09-11 ENCOUNTER — Other Ambulatory Visit: Payer: Self-pay

## 2018-09-11 ENCOUNTER — Inpatient Hospital Stay (HOSPITAL_BASED_OUTPATIENT_CLINIC_OR_DEPARTMENT_OTHER): Payer: Medicare Other | Admitting: Hematology & Oncology

## 2018-09-11 ENCOUNTER — Encounter: Payer: Self-pay | Admitting: Hematology & Oncology

## 2018-09-11 ENCOUNTER — Inpatient Hospital Stay: Payer: Medicare Other

## 2018-09-11 ENCOUNTER — Inpatient Hospital Stay: Payer: Medicare Other | Attending: Hematology & Oncology

## 2018-09-11 DIAGNOSIS — D591 Other autoimmune hemolytic anemias: Secondary | ICD-10-CM | POA: Diagnosis not present

## 2018-09-11 DIAGNOSIS — C8580 Other specified types of non-Hodgkin lymphoma, unspecified site: Secondary | ICD-10-CM | POA: Insufficient documentation

## 2018-09-11 DIAGNOSIS — Z79899 Other long term (current) drug therapy: Secondary | ICD-10-CM | POA: Insufficient documentation

## 2018-09-11 DIAGNOSIS — R911 Solitary pulmonary nodule: Secondary | ICD-10-CM

## 2018-09-11 DIAGNOSIS — D500 Iron deficiency anemia secondary to blood loss (chronic): Secondary | ICD-10-CM

## 2018-09-11 DIAGNOSIS — C8307 Small cell B-cell lymphoma, spleen: Secondary | ICD-10-CM

## 2018-09-11 LAB — CBC WITH DIFFERENTIAL (CANCER CENTER ONLY)
Abs Immature Granulocytes: 0.03 10*3/uL (ref 0.00–0.07)
Basophils Absolute: 0 10*3/uL (ref 0.0–0.1)
Basophils Relative: 1 %
Eosinophils Absolute: 0.1 10*3/uL (ref 0.0–0.5)
Eosinophils Relative: 2 %
HCT: 39 % (ref 36.0–46.0)
Hemoglobin: 11.9 g/dL — ABNORMAL LOW (ref 12.0–15.0)
Immature Granulocytes: 1 %
Lymphocytes Relative: 12 %
Lymphs Abs: 0.5 10*3/uL — ABNORMAL LOW (ref 0.7–4.0)
MCH: 25.1 pg — ABNORMAL LOW (ref 26.0–34.0)
MCHC: 30.5 g/dL (ref 30.0–36.0)
MCV: 82.3 fL (ref 80.0–100.0)
Monocytes Absolute: 0.3 10*3/uL (ref 0.1–1.0)
Monocytes Relative: 7 %
Neutro Abs: 3.1 10*3/uL (ref 1.7–7.7)
Neutrophils Relative %: 77 %
Platelet Count: 147 10*3/uL — ABNORMAL LOW (ref 150–400)
RBC: 4.74 MIL/uL (ref 3.87–5.11)
RDW: 14.9 % (ref 11.5–15.5)
WBC Count: 4 10*3/uL (ref 4.0–10.5)
nRBC: 0 % (ref 0.0–0.2)

## 2018-09-11 LAB — CMP (CANCER CENTER ONLY)
ALT: 13 U/L (ref 0–44)
AST: 21 U/L (ref 15–41)
Albumin: 4.1 g/dL (ref 3.5–5.0)
Alkaline Phosphatase: 114 U/L (ref 38–126)
Anion gap: 7 (ref 5–15)
BUN: 14 mg/dL (ref 8–23)
CO2: 28 mmol/L (ref 22–32)
Calcium: 8.1 mg/dL — ABNORMAL LOW (ref 8.9–10.3)
Chloride: 106 mmol/L (ref 98–111)
Creatinine: 0.72 mg/dL (ref 0.44–1.00)
GFR, Est AFR Am: >60 mL/min (ref >60)
GFR, Estimated: >60 mL/min (ref >60)
Glucose, Bld: 121 mg/dL — ABNORMAL HIGH (ref 70–99)
Potassium: 4 mmol/L (ref 3.5–5.1)
Sodium: 141 mmol/L (ref 135–145)
Total Bilirubin: 0.6 mg/dL (ref 0.3–1.2)
Total Protein: 6.1 g/dL — ABNORMAL LOW (ref 6.5–8.1)

## 2018-09-11 NOTE — Patient Instructions (Signed)

## 2018-09-11 NOTE — Progress Notes (Signed)
Hematology and Oncology Follow Up Visit  Kristine Sullivan [id number] 09/18/1931 85 y.o. 09/11/2018   Principle Diagnosis:  Marginal zone lymphoma Iron deficiency anemia  Current Therapy:  CVP I think completed 8 cycles on 04/09/2012  Maintenance Rituxan - 2 years completed in June 2016 IV iron - last dose on 06/2018    Interim History:  Kristine Sullivan is here today for follow-up.  Kristine Sullivan is doing okay.  Kristine Sullivan has been very cautious.  Kristine Sullivan basically is staying home.  Kristine Sullivan has had no problems with nausea or vomiting.  There is been no bleeding.  There is been no change in bowel or bladder habits.  Kristine Sullivan has had no rashes.  There is been no leg swelling.  Her iron studies back in May showed a ferritin of 95 with an iron saturation of 15%.  I think Kristine Sullivan got a dose of IV iron back then.    The last CT scan was done back in May 2020.  This showed fluctuating nodularity in both lungs.  Was felt that this was inflammatory or post inflammatory.  Of course, radiology findings recommended a follow-up in 6-12 months.  Kristine Sullivan is had no cough or shortness of breath.  Kristine Sullivan has had no bleeding.  Kristine Sullivan has had no headache.    Overall, her performance status is ECOG 1.  Medications:  Allergies as of 09/11/2018      Reactions   Lisinopril Swelling   Facial swelling  Facial swelling    Timolol Other (See Comments), Swelling   Pregabalin Other (See Comments)   constipation constipation constipation      Medication List       Accurate as of September 11, 2018 10:33 AM. If you have any questions, ask your nurse or doctor.        amLODipine 5 MG tablet Commonly known as: NORVASC Take 5 mg by mouth daily.   apixaban 5 MG Tabs tablet Commonly known as: ELIQUIS Take 5 mg by mouth.   aspirin 81 MG tablet Take 81 mg by mouth daily.   atorvastatin 80 MG tablet Commonly known as: LIPITOR Take 40 mg by mouth daily.   Calcium 600-D 600-400 MG-UNIT Tabs Generic drug: Calcium Carbonate-Vitamin D3 Take by mouth.  diphenhydrAMINE 25 MG tablet Commonly known as: BENADRYL Take 25 mg by mouth.   dorzolamide 2 % ophthalmic solution Commonly known as: TRUSOPT Place 1 drop into both eyes 3 (three) times daily.   ferrous sulfate 325 (65 FE) MG tablet Take 325 mg by mouth daily.   furosemide 40 MG tablet Commonly known as: LASIX Take 40 mg by mouth daily.   gabapentin 300 MG capsule Commonly known as: NEURONTIN Take 1 capsule (300 mg total) by mouth at bedtime. 90 day supply   ipratropium-albuterol 0.5-2.5 (3) MG/3ML Soln Commonly known as: DUONEB INHALE 3 ML BY NEBULIZATION EVERY SIX (6) HOURS AS NEEDED.   latanoprost 0.005 % ophthalmic solution Commonly known as: XALATAN   lidocaine-prilocaine cream Commonly known as: EMLA Apply 1 application topically as needed.   loratadine 10 MG tablet Commonly known as: CLARITIN OTC Take by mouth one a day for 10 days, then once a day as needed -allergies.   metFORMIN 500 MG tablet Commonly known as: GLUCOPHAGE Take 500 mg by mouth daily with breakfast.   potassium chloride 10 MEQ tablet Commonly known as: K-DUR Take 10 mEq by mouth daily.   Propylene Glycol 0.6 % Soln 1 drop.   raloxifene 60 MG tablet Commonly known as: EVISTA Take  60 mg by mouth daily.   ranitidine 150 MG tablet Commonly known as: ZANTAC 1 tablet by mouth twice a day for month and then take twice a day for needed - GERD   tiZANidine 4 MG capsule Commonly known as: ZANAFLEX Take 4 mg by mouth at bedtime.   Vitamin D (Ergocalciferol) 1.25 MG (50000 UT) Caps capsule Commonly known as: DRISDOL TAKE ONE CAPSULE BY MOUTH 3 DAYS WEEK WITH A MEAL FOR LOW VITAMIN D       Allergies:  Allergies  Allergen Reactions  . Lisinopril Swelling    Facial swelling  Facial swelling    . Timolol Other (See Comments) and Swelling  . Pregabalin Other (See Comments)    constipation constipation  constipation    Past Medical History, Surgical history, Social history, and  Family History were reviewed and updated.  Review of Systems: Review of Systems  Constitutional: Negative.   HENT: Negative.   Eyes: Negative.   Respiratory: Negative.   Cardiovascular: Negative.   Gastrointestinal: Negative.   Genitourinary: Negative.   Musculoskeletal: Negative.   Skin: Negative.   Neurological: Negative.   Endo/Heme/Allergies: Negative.   Psychiatric/Behavioral: Negative.      Physical Exam:  vitals were not taken for this visit.   Wt Readings from Last 3 Encounters:  06/28/18 171 lb (77.6 kg)  12/21/17 170 lb (77.1 kg)  09/14/17 166 lb (75.3 kg)    Physical Exam Vitals signs reviewed.  HENT:     Head: Normocephalic and atraumatic.  Eyes:     Pupils: Pupils are equal, round, and reactive to light.  Neck:     Musculoskeletal: Normal range of motion.  Cardiovascular:     Rate and Rhythm: Normal rate and regular rhythm.     Heart sounds: Normal heart sounds.  Pulmonary:     Effort: Pulmonary effort is normal.     Breath sounds: Normal breath sounds.  Abdominal:     General: Bowel sounds are normal.     Palpations: Abdomen is soft.  Musculoskeletal: Normal range of motion.        General: No tenderness or deformity.  Lymphadenopathy:     Cervical: No cervical adenopathy.  Skin:    General: Skin is warm and dry.     Findings: No erythema or rash.  Neurological:     Mental Status: Kristine Sullivan is alert and oriented to person, place, and time.  Psychiatric:        Behavior: Behavior normal.        Thought Content: Thought content normal.        Judgment: Judgment normal.     Lab Results  Component Value Date   WBC 4.0 09/11/2018   HGB 11.9 (L) 09/11/2018   HCT 39.0 09/11/2018   MCV 82.3 09/11/2018   PLT 147 (L) 09/11/2018   Lab Results  Component Value Date   FERRITIN 95 06/28/2018   IRON 54 06/28/2018   TIBC 364 06/28/2018   UIBC 309 06/28/2018   IRONPCTSAT 15 (L) 06/28/2018   Lab Results  Component Value Date   RETICCTPCT 1.6  06/28/2018   RBC 4.74 09/11/2018   RETICCTABS 59.2 11/13/2013   No results found for: KPAFRELGTCHN, LAMBDASER, KAPLAMBRATIO Lab Results  Component Value Date   IGGSERUM 557 (L) 06/19/2013   IGA 100 06/19/2013   IGMSERUM 95 06/19/2013   Lab Results  Component Value Date   TOTALPROTELP 6.3 09/16/2011   ALBUMINELP 59.9 09/16/2011   A1GS 7.3 (H) 09/16/2011   A2GS  9.9 09/16/2011   BETS 8.4 (H) 09/16/2011   BETA2SER 4.8 09/16/2011   GAMS 9.7 (L) 09/16/2011   MSPIKE NOT DET 09/16/2011   SPEI * 09/16/2011     Chemistry      Component Value Date/Time   NA 141 09/11/2018 0930   NA 140 09/16/2016 0945   K 4.0 09/11/2018 0930   K 3.2 (L) 09/16/2016 0945   CL 106 09/11/2018 0930   CL 101 12/08/2014 1138   CL 99 05/28/2012 1036   CO2 28 09/11/2018 0930   CO2 26 09/16/2016 0945   BUN 14 09/11/2018 0930   BUN 9.0 09/16/2016 0945   CREATININE 0.72 09/11/2018 0930   CREATININE 0.7 09/16/2016 0945      Component Value Date/Time   CALCIUM 8.1 (L) 09/11/2018 0930   CALCIUM 9.0 09/16/2016 0945   ALKPHOS 114 09/11/2018 0930   ALKPHOS 122 09/16/2016 0945   AST 21 09/11/2018 0930   AST 25 09/16/2016 0945   ALT 13 09/11/2018 0930   ALT 15 09/16/2016 0945   BILITOT 0.6 09/11/2018 0930   BILITOT 0.90 09/16/2016 0945     Impression and Plan: Kristine Sullivan is a very pleasant 85 yo African American female with a history of marginal zone lymphoma and autoimmune hemolytic anemia. Kristine Sullivan was treated with systemic chemotherapy and has remained in remission.   We will plan to get her back in 4 months.  Probably, next year, we will see about doing another CT scan on her for these pulmonary nodules.  I just cannot imagine that they are anything malignant.  [name], MD 7/21/202010:33 AM

## 2018-09-12 ENCOUNTER — Telehealth: Payer: Self-pay | Admitting: *Deleted

## 2018-09-12 ENCOUNTER — Telehealth: Payer: Self-pay | Admitting: Hematology & Oncology

## 2018-09-12 LAB — IRON AND TIBC
Iron: 64 ug/dL (ref 41–142)
Saturation Ratios: 17 % — ABNORMAL LOW (ref 21–57)
TIBC: 378 ug/dL (ref 236–444)
UIBC: 314 ug/dL (ref 120–384)

## 2018-09-12 LAB — FERRITIN: Ferritin: 77 ng/mL (ref 11–307)

## 2018-09-12 NOTE — Telephone Encounter (Signed)
Appointment scheduled and I called patient .  She was ok with date/time per 7/22 sch msg

## 2018-09-12 NOTE — Telephone Encounter (Signed)
-----   Message from [name], MD sent at 09/12/2018 11:06 AM EDT ----- Call - the iron is low.. please set up 1 dose of IV iron.[name]  Laurey Arrow

## 2018-09-12 NOTE — Telephone Encounter (Signed)
LMVOM for pt with results and to expect a  Call from scheduling with appt information.

## 2018-09-17 ENCOUNTER — Encounter: Payer: Self-pay | Admitting: *Deleted

## 2018-09-17 MED ORDER — MUPIROCIN 2 % EX OINT
TOPICAL_OINTMENT | CUTANEOUS | Status: DC
Start: 2018-09-17 — End: 2018-09-17

## 2018-09-17 MED ORDER — IPRATROPIUM-ALBUTEROL 0.5-2.5 (3) MG/3ML IN SOLN
3.00 | RESPIRATORY_TRACT | Status: DC
Start: 2018-09-17 — End: 2018-09-17

## 2018-09-17 MED ORDER — ATORVASTATIN CALCIUM 40 MG PO TABS
80.00 | ORAL_TABLET | ORAL | Status: DC
Start: 2018-09-17 — End: 2018-09-17

## 2018-09-17 MED ORDER — DEXTROSE 10 % IV SOLN
125.00 | INTRAVENOUS | Status: DC
Start: ? — End: 2018-09-17

## 2018-09-17 MED ORDER — ASPIRIN EC 81 MG PO TBEC
81.00 | DELAYED_RELEASE_TABLET | ORAL | Status: DC
Start: 2018-09-18 — End: 2018-09-17

## 2018-09-17 MED ORDER — FUROSEMIDE 40 MG PO TABS
40.00 | ORAL_TABLET | ORAL | Status: DC
Start: 2018-09-18 — End: 2018-09-17

## 2018-09-17 MED ORDER — INSULIN LISPRO 100 UNIT/ML ~~LOC~~ SOLN
2.00 | SUBCUTANEOUS | Status: DC
Start: 2018-09-17 — End: 2018-09-17

## 2018-09-17 MED ORDER — IPRATROPIUM-ALBUTEROL 0.5-2.5 (3) MG/3ML IN SOLN
3.00 | RESPIRATORY_TRACT | Status: DC
Start: ? — End: 2018-09-17

## 2018-09-17 MED ORDER — APIXABAN 5 MG PO TABS
5.00 | ORAL_TABLET | ORAL | Status: DC
Start: 2018-09-17 — End: 2018-09-17

## 2018-09-17 MED ORDER — FERROUS SULFATE 325 (65 FE) MG PO TABS
325.00 | ORAL_TABLET | ORAL | Status: DC
Start: 2018-09-18 — End: 2018-09-17

## 2018-09-17 MED ORDER — GLUCOSE 40 % PO GEL
15.00 | ORAL | Status: DC
Start: ? — End: 2018-09-17

## 2018-09-17 MED ORDER — AMLODIPINE BESYLATE 5 MG PO TABS
5.00 | ORAL_TABLET | ORAL | Status: DC
Start: 2018-09-18 — End: 2018-09-17

## 2018-09-17 NOTE — Progress Notes (Signed)
Call received from patient's daughter to scheduling to notify Dr. Marin Olp that pt has been admitted to [facility].  Dr. Marin Olp notified.

## 2018-09-18 ENCOUNTER — Inpatient Hospital Stay: Payer: Medicare Other

## 2018-10-23 ENCOUNTER — Telehealth: Payer: Self-pay | Admitting: *Deleted

## 2018-10-23 NOTE — Telephone Encounter (Signed)
Message received from scheduler for RN to call patient's daughter in law regarding pt.'s current admission to [facility].  Call placed to [facility] and she states that pt will be discharged this evening.  Dr. Marin Olp notified and order received for pt to come in next week for labs and Dr. Marin Olp.  Call placed back to [facility] to inform her of Dr. Antonieta Pert orders.  Pt.'s daughter-in-law requests for scheduling to call her tomorrow.

## 2018-10-24 ENCOUNTER — Telehealth: Payer: Self-pay | Admitting: Hematology & Oncology

## 2018-10-24 NOTE — Telephone Encounter (Signed)
lmom for 9/11 appts at 1230 pm per 9/2sch msg

## 2018-10-31 ENCOUNTER — Telehealth: Payer: Self-pay | Admitting: *Deleted

## 2018-10-31 NOTE — Telephone Encounter (Signed)
Call received from patient requesting to know when her next appt is.  Appointments reviewed with patient for this Friday, 11/02/18.  Pt states that she will be here on 11/02/18 as scheduled.  Pt appreciative of assistance.

## 2018-11-02 ENCOUNTER — Other Ambulatory Visit: Payer: Self-pay

## 2018-11-02 ENCOUNTER — Inpatient Hospital Stay: Payer: Medicare Other

## 2018-11-02 ENCOUNTER — Inpatient Hospital Stay: Payer: Medicare Other | Attending: Hematology & Oncology | Admitting: Hematology & Oncology

## 2018-11-02 ENCOUNTER — Encounter: Payer: Self-pay | Admitting: Hematology & Oncology

## 2018-11-02 VITALS — BP 127/71 | HR 84 | Temp 97.7°F | Resp 18 | Wt 171.8 lb

## 2018-11-02 DIAGNOSIS — D509 Iron deficiency anemia, unspecified: Secondary | ICD-10-CM | POA: Diagnosis not present

## 2018-11-02 DIAGNOSIS — Z5111 Encounter for antineoplastic chemotherapy: Secondary | ICD-10-CM | POA: Diagnosis not present

## 2018-11-02 DIAGNOSIS — Z7984 Long term (current) use of oral hypoglycemic drugs: Secondary | ICD-10-CM | POA: Insufficient documentation

## 2018-11-02 DIAGNOSIS — Z7901 Long term (current) use of anticoagulants: Secondary | ICD-10-CM | POA: Diagnosis not present

## 2018-11-02 DIAGNOSIS — Z7189 Other specified counseling: Secondary | ICD-10-CM | POA: Diagnosis not present

## 2018-11-02 DIAGNOSIS — Z7982 Long term (current) use of aspirin: Secondary | ICD-10-CM | POA: Insufficient documentation

## 2018-11-02 DIAGNOSIS — Z5112 Encounter for antineoplastic immunotherapy: Secondary | ICD-10-CM | POA: Insufficient documentation

## 2018-11-02 DIAGNOSIS — C8307 Small cell B-cell lymphoma, spleen: Secondary | ICD-10-CM

## 2018-11-02 DIAGNOSIS — C8580 Other specified types of non-Hodgkin lymphoma, unspecified site: Secondary | ICD-10-CM | POA: Insufficient documentation

## 2018-11-02 DIAGNOSIS — D500 Iron deficiency anemia secondary to blood loss (chronic): Secondary | ICD-10-CM

## 2018-11-02 DIAGNOSIS — Z79899 Other long term (current) drug therapy: Secondary | ICD-10-CM | POA: Insufficient documentation

## 2018-11-02 HISTORY — DX: Other specified counseling: Z71.89

## 2018-11-02 LAB — CMP (CANCER CENTER ONLY)
ALT: 13 U/L (ref 0–44)
AST: 21 U/L (ref 15–41)
Albumin: 4.1 g/dL (ref 3.5–5.0)
Alkaline Phosphatase: 109 U/L (ref 38–126)
Anion gap: 8 (ref 5–15)
BUN: 9 mg/dL (ref 8–23)
CO2: 28 mmol/L (ref 22–32)
Calcium: 9.1 mg/dL (ref 8.9–10.3)
Chloride: 106 mmol/L (ref 98–111)
Creatinine: 0.7 mg/dL (ref 0.44–1.00)
GFR, Est AFR Am: >60 mL/min (ref >60)
GFR, Estimated: >60 mL/min (ref >60)
Glucose, Bld: 118 mg/dL — ABNORMAL HIGH (ref 70–99)
Potassium: 3.9 mmol/L (ref 3.5–5.1)
Sodium: 142 mmol/L (ref 135–145)
Total Bilirubin: 1.6 mg/dL — ABNORMAL HIGH (ref 0.3–1.2)
Total Protein: 6.2 g/dL — ABNORMAL LOW (ref 6.5–8.1)

## 2018-11-02 LAB — CBC WITH DIFFERENTIAL (CANCER CENTER ONLY)
Abs Immature Granulocytes: 0.07 10*3/uL (ref 0.00–0.07)
Basophils Absolute: 0 10*3/uL (ref 0.0–0.1)
Basophils Relative: 1 %
Eosinophils Absolute: 0.1 10*3/uL (ref 0.0–0.5)
Eosinophils Relative: 2 %
HCT: 33.7 % — ABNORMAL LOW (ref 36.0–46.0)
Hemoglobin: 10.1 g/dL — ABNORMAL LOW (ref 12.0–15.0)
Immature Granulocytes: 2 %
Lymphocytes Relative: 12 %
Lymphs Abs: 0.5 10*3/uL — ABNORMAL LOW (ref 0.7–4.0)
MCH: 26.5 pg (ref 26.0–34.0)
MCHC: 30 g/dL (ref 30.0–36.0)
MCV: 88.5 fL (ref 80.0–100.0)
Monocytes Absolute: 0.2 10*3/uL (ref 0.1–1.0)
Monocytes Relative: 5 %
Neutro Abs: 3.5 10*3/uL (ref 1.7–7.7)
Neutrophils Relative %: 78 %
Platelet Count: 149 10*3/uL — ABNORMAL LOW (ref 150–400)
RBC: 3.81 MIL/uL — ABNORMAL LOW (ref 3.87–5.11)
RDW: 16.3 % — ABNORMAL HIGH (ref 11.5–15.5)
WBC Count: 4.4 10*3/uL (ref 4.0–10.5)
nRBC: 0 % (ref 0.0–0.2)

## 2018-11-02 MED ORDER — HEPARIN SOD (PORK) LOCK FLUSH 100 UNIT/ML IV SOLN
500.0000 [IU] | Freq: Once | INTRAVENOUS | Status: AC
  Administered 2018-11-02: 13:00:00 500 [IU] via INTRAVENOUS
  Filled 2018-11-02: qty 5

## 2018-11-02 MED ORDER — SODIUM CHLORIDE 0.9% FLUSH
10.0000 mL | Freq: Once | INTRAVENOUS | Status: AC
  Administered 2018-11-02: 10 mL
  Filled 2018-11-02: qty 10

## 2018-11-02 NOTE — Progress Notes (Signed)
START ON PATHWAY REGIMEN - Lymphoma and CLL     Administer weekly:     Rituximab-xxxx   **Always confirm dose/schedule in your pharmacy ordering system**  Patient Characteristics: Marginal Zone Lymphoma, Systemic, First Line, Symptomatic Disease Type: Marginal Zone Lymphoma Disease Type: Not Applicable Disease Type: Not Applicable Localized or Systemic Disease<= Systemic [place] Stage: IV Line of Therapy: First Line Asymptomatic or Symptomatic<= Symptomatic Intent of Therapy: Non-Curative / Palliative Intent, Discussed with Patient 

## 2018-11-02 NOTE — Patient Instructions (Signed)

## 2018-11-02 NOTE — Progress Notes (Signed)
Hematology and Oncology Follow Up Visit  Kristine Sullivan [id number] Dec 23, 1931 85 y.o. 11/02/2018   Principle Diagnosis:  Marginal zone lymphoma -- recurrent Iron deficiency anemia  Current Therapy:  Rituxan/Cytoxan -- start cycle #1/4 on 11/08/2018  IV Iron as needed  Past therapy: CVP -- completed 8 cycles on 04/09/2012  Maintenance Rituxan - 2 years completed in June 2016 IV iron - last dose on 06/2018    Interim History:  Kristine Sullivan is here today for follow-up.  Unfortunately, I think that we have a problem with her now.  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  Typically, this is a condition that is associated with her marginal zone lymphoma.  When she first presented with her lymphoma, the angioedema was a big problem for her.  Once we treated the lymphoma, the angioedema went away.  She was hospitalized at [facility] because of angioedema.  She is feeling okay.  Her legs are little bit weak.  She is not noted any swollen lymph nodes.  She has had no wheezing.  She has had no problems swallowing.  I think that we are going to have to get her back on treatment.  I believe that Cytoxan/Rituxan would be a good option for her.  I know that she is 85 years old.  I think this would be a reasonable protocol for her that will help with her lymphoma and also help with the angioedema.  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  She has had no diarrhea.  She has had no rashes.  Her legs do feel a little weak.  She has had occasional swelling.  Overall, I would say her performance status is ECOG 1.     Medications:  Allergies as of 11/02/2018      Reactions   Lisinopril Swelling   Facial swelling  Facial swelling    Timolol Other (See Comments), Swelling   Pregabalin Other (See Comments)   constipation constipation constipation      Medication List       Accurate as of November 02, 2018   1:18 PM. If you have any questions, ask your nurse or doctor.        alendronate 70 MG tablet Commonly known as: FOSAMAX Take by mouth.   amLODipine 5 MG tablet Commonly known as: NORVASC Take 5 mg by mouth daily.   apixaban 5 MG Tabs tablet Commonly known as: ELIQUIS Take 5 mg by mouth.   aspirin 81 MG tablet Take 81 mg by mouth daily.   atorvastatin 80 MG tablet Commonly known as: LIPITOR Take 40 mg by mouth daily.   Calcium 600-D 600-400 MG-UNIT Tabs Generic drug: Calcium Carbonate-Vitamin D3 Take by mouth.   diphenhydrAMINE 25 MG tablet Commonly known as: BENADRYL Take 25 mg by mouth.   dorzolamide 2 % ophthalmic solution Commonly known as: TRUSOPT Place 1 drop into both eyes 3 (three) times daily.   ferrous sulfate 325 (65 FE) MG tablet Take 325 mg by mouth daily.   furosemide 40 MG tablet Commonly known as: LASIX Take 40 mg by mouth daily.   gabapentin 300 MG capsule Commonly known as: NEURONTIN Take 1 capsule (300 mg total) by mouth at bedtime. 90 day supply   ipratropium-albuterol 0.5-2.5 (3) MG/3ML Soln Commonly known as: DUONEB INHALE 3 ML BY NEBULIZATION EVERY SIX (6) HOURS AS NEEDED.   latanoprost 0.005 % ophthalmic solution Commonly known as: Ivin Poot  lidocaine-prilocaine cream Commonly known as: EMLA Apply 1 application topically as needed.   loratadine 10 MG tablet Commonly known as: CLARITIN OTC Take by mouth one a day for 10 days, then once a day as needed -allergies.   metFORMIN 500 MG tablet Commonly known as: GLUCOPHAGE Take 500 mg by mouth daily with breakfast.   potassium chloride 10 MEQ tablet Commonly known as: K-DUR Take 10 mEq by mouth daily.   Propylene Glycol 0.6 % Soln 1 drop.   raloxifene 60 MG tablet Commonly known as: EVISTA Take 60 mg by mouth daily.   ranitidine 150 MG tablet Commonly known as: ZANTAC 1 tablet by mouth twice a day for month and then take twice a day for needed - GERD   tiZANidine 4 MG  capsule Commonly known as: ZANAFLEX Take 4 mg by mouth at bedtime.   Vitamin D (Ergocalciferol) 1.25 MG (50000 UT) Caps capsule Commonly known as: DRISDOL TAKE ONE CAPSULE BY MOUTH 3 DAYS WEEK WITH A MEAL FOR LOW VITAMIN D       Allergies:  Allergies  Allergen Reactions  . Lisinopril Swelling    Facial swelling  Facial swelling    . Timolol Other (See Comments) and Swelling  . Pregabalin Other (See Comments)    constipation constipation  constipation    Past Medical History, Surgical history, Social history, and Family History were reviewed and updated.  Review of Systems: Review of Systems  Constitutional: Negative.   HENT: Negative.   Eyes: Negative.   Respiratory: Negative.   Cardiovascular: Negative.   Gastrointestinal: Negative.   Genitourinary: Negative.   Musculoskeletal: Negative.   Skin: Negative.   Neurological: Negative.   Endo/Heme/Allergies: Negative.   Psychiatric/Behavioral: Negative.      Physical Exam:  weight is 171 lb 12 oz (77.9 kg). Her temporal temperature is 97.7 F (36.5 C). Her blood pressure is 127/71 and her pulse is 84. Her respiration is 18 and oxygen saturation is 95%.   Wt Readings from Last 3 Encounters:  11/02/18 171 lb 12 oz (77.9 kg)  06/28/18 171 lb (77.6 kg)  12/21/17 170 lb (77.1 kg)    Physical Exam Vitals signs reviewed.  HENT:     Head: Normocephalic and atraumatic.  Eyes:     Pupils: Pupils are equal, round, and reactive to light.  Neck:     Musculoskeletal: Normal range of motion.  Cardiovascular:     Rate and Rhythm: Normal rate and regular rhythm.     Heart sounds: Normal heart sounds.  Pulmonary:     Effort: Pulmonary effort is normal.     Breath sounds: Normal breath sounds.  Abdominal:     General: Bowel sounds are normal.     Palpations: Abdomen is soft.  Musculoskeletal: Normal range of motion.        General: No tenderness or deformity.  Lymphadenopathy:     Cervical: No cervical adenopathy.   Skin:    General: Skin is warm and dry.     Findings: No erythema or rash.  Neurological:     Mental Status: She is alert and oriented to person, place, and time.  Psychiatric:        Behavior: Behavior normal.        Thought Content: Thought content normal.        Judgment: Judgment normal.     Lab Results  Component Value Date   WBC 4.4 11/02/2018   HGB 10.1 (L) 11/02/2018   HCT 33.7 (L) 11/02/2018  MCV 88.5 11/02/2018   PLT 149 (L) 11/02/2018   Lab Results  Component Value Date   FERRITIN 77 09/11/2018   IRON 64 09/11/2018   TIBC 378 09/11/2018   UIBC 314 09/11/2018   IRONPCTSAT 17 (L) 09/11/2018   Lab Results  Component Value Date   RETICCTPCT 1.6 06/28/2018   RBC 3.81 (L) 11/02/2018   RETICCTABS 59.2 11/13/2013   No results found for: Nils Pyle, [facility] Lab Results  Component Value Date   IGGSERUM 557 (L) 06/19/2013   IGA 100 06/19/2013   IGMSERUM 95 06/19/2013   Lab Results  Component Value Date   TOTALPROTELP 6.3 09/16/2011   ALBUMINELP 59.9 09/16/2011   A1GS 7.3 (H) 09/16/2011   A2GS 9.9 09/16/2011   BETS 8.4 (H) 09/16/2011   BETA2SER 4.8 09/16/2011   GAMS 9.7 (L) 09/16/2011   MSPIKE NOT DET 09/16/2011   SPEI * 09/16/2011     Chemistry      Component Value Date/Time   NA 142 11/02/2018 1229   NA 140 09/16/2016 0945   K 3.9 11/02/2018 1229   K 3.2 (L) 09/16/2016 0945   CL 106 11/02/2018 1229   CL 101 12/08/2014 1138   CL 99 05/28/2012 1036   CO2 28 11/02/2018 1229   CO2 26 09/16/2016 0945   BUN 9 11/02/2018 1229   BUN 9.0 09/16/2016 0945   CREATININE 0.70 11/02/2018 1229   CREATININE 0.7 09/16/2016 0945      Component Value Date/Time   CALCIUM 9.1 11/02/2018 1229   CALCIUM 9.0 09/16/2016 0945   ALKPHOS 109 11/02/2018 1229   ALKPHOS 122 09/16/2016 0945   AST 21 11/02/2018 1229   AST 25 09/16/2016 0945   ALT 13 11/02/2018 1229   ALT 15 09/16/2016 0945   BILITOT 1.6 (H) 11/02/2018 1229   BILITOT 0.90 09/16/2016  0945     Impression and Plan: Kristine Sullivan is a very pleasant 85 yo African American female with a history of marginal zone lymphoma, angioedema and autoimmune hemolytic anemia.   We will go ahead and see about getting her on treatment.  I think 4 cycles of treatment would be effective and tolerable.  I know it will be hard for [place] to monitor for a response.  I think the only way we are going to monitor for her response is if she has the angioedema recurrence.  I spent about 45 minutes with her today.  I talked to her about treatment.  I explained why I thought that treatment was needed.  I just hate that she would have to be in the hospital with these episodes of angioedema.  I noted that she is little more anemic.  We will have to make sure that she is not having any hemolysis.  We will plan to start treatment next week.  I will plan to see her back when she starts cycle #2 of treatment in October.  She is very agreeable to start treatment.  Thankfully, shortly has a Port-A-Cath in place. [name]  [name], MD 9/11/20201:18 PM

## 2018-11-05 LAB — IRON AND TIBC
Iron: 76 ug/dL (ref 41–142)
Saturation Ratios: 22 % (ref 21–57)
TIBC: 339 ug/dL (ref 236–444)
UIBC: 263 ug/dL (ref 120–384)

## 2018-11-05 LAB — FERRITIN: Ferritin: 208 ng/mL (ref 11–307)

## 2018-11-07 ENCOUNTER — Encounter: Payer: Self-pay | Admitting: Pharmacist

## 2018-11-07 NOTE — Progress Notes (Unsigned)
Patient has had Rituxan previously and was tested for Hepatitis B at the time. Retesting is not needed prior to treatment on 9/17 per Dr. Marin Olp.

## 2018-11-08 ENCOUNTER — Other Ambulatory Visit: Payer: Self-pay

## 2018-11-08 ENCOUNTER — Inpatient Hospital Stay: Payer: Medicare Other

## 2018-11-08 ENCOUNTER — Other Ambulatory Visit: Payer: Self-pay | Admitting: Hematology & Oncology

## 2018-11-08 VITALS — BP 119/49 | HR 91 | Temp 97.3°F | Resp 18

## 2018-11-08 DIAGNOSIS — C8307 Small cell B-cell lymphoma, spleen: Secondary | ICD-10-CM

## 2018-11-08 DIAGNOSIS — Z5112 Encounter for antineoplastic immunotherapy: Secondary | ICD-10-CM | POA: Diagnosis not present

## 2018-11-08 DIAGNOSIS — Z7189 Other specified counseling: Secondary | ICD-10-CM

## 2018-11-08 DIAGNOSIS — D500 Iron deficiency anemia secondary to blood loss (chronic): Secondary | ICD-10-CM

## 2018-11-08 DIAGNOSIS — Z95828 Presence of other vascular implants and grafts: Secondary | ICD-10-CM

## 2018-11-08 LAB — CMP (CANCER CENTER ONLY)
ALT: 11 U/L (ref 0–44)
AST: 19 U/L (ref 15–41)
Albumin: 4 g/dL (ref 3.5–5.0)
Alkaline Phosphatase: 99 U/L (ref 38–126)
Anion gap: 10 (ref 5–15)
BUN: 14 mg/dL (ref 8–23)
CO2: 28 mmol/L (ref 22–32)
Calcium: 9.1 mg/dL (ref 8.9–10.3)
Chloride: 104 mmol/L (ref 98–111)
Creatinine: 0.77 mg/dL (ref 0.44–1.00)
GFR, Est AFR Am: >60 mL/min (ref >60)
GFR, Estimated: >60 mL/min (ref >60)
Glucose, Bld: 125 mg/dL — ABNORMAL HIGH (ref 70–99)
Potassium: 3.7 mmol/L (ref 3.5–5.1)
Sodium: 142 mmol/L (ref 135–145)
Total Bilirubin: 2.4 mg/dL — ABNORMAL HIGH (ref 0.3–1.2)
Total Protein: 6.1 g/dL — ABNORMAL LOW (ref 6.5–8.1)

## 2018-11-08 LAB — CBC WITH DIFFERENTIAL (CANCER CENTER ONLY)
Abs Immature Granulocytes: 0.03 10*3/uL (ref 0.00–0.07)
Basophils Absolute: 0 10*3/uL (ref 0.0–0.1)
Basophils Relative: 1 %
Eosinophils Absolute: 0 10*3/uL (ref 0.0–0.5)
Eosinophils Relative: 1 %
HCT: 31 % — ABNORMAL LOW (ref 36.0–46.0)
Hemoglobin: 9.4 g/dL — ABNORMAL LOW (ref 12.0–15.0)
Immature Granulocytes: 1 %
Lymphocytes Relative: 8 %
Lymphs Abs: 0.4 10*3/uL — ABNORMAL LOW (ref 0.7–4.0)
MCH: 27.3 pg (ref 26.0–34.0)
MCHC: 30.3 g/dL (ref 30.0–36.0)
MCV: 90.1 fL (ref 80.0–100.0)
Monocytes Absolute: 0.2 10*3/uL (ref 0.1–1.0)
Monocytes Relative: 5 %
Neutro Abs: 3.6 10*3/uL (ref 1.7–7.7)
Neutrophils Relative %: 84 %
Platelet Count: 147 10*3/uL — ABNORMAL LOW (ref 150–400)
RBC: 3.44 MIL/uL — ABNORMAL LOW (ref 3.87–5.11)
RDW: 16.9 % — ABNORMAL HIGH (ref 11.5–15.5)
WBC Count: 4.3 10*3/uL (ref 4.0–10.5)
nRBC: 0 % (ref 0.0–0.2)

## 2018-11-08 LAB — FERRITIN: Ferritin: 310 ng/mL — ABNORMAL HIGH (ref 11–307)

## 2018-11-08 LAB — IRON AND TIBC
Iron: 61 ug/dL (ref 41–142)
Saturation Ratios: 18 % — ABNORMAL LOW (ref 21–57)
TIBC: 335 ug/dL (ref 236–444)
UIBC: 274 ug/dL (ref 120–384)

## 2018-11-08 LAB — LACTATE DEHYDROGENASE: LDH: 294 U/L — ABNORMAL HIGH (ref 98–192)

## 2018-11-08 MED ORDER — SODIUM CHLORIDE 0.9 % IV SOLN
360.0000 mg/m2 | Freq: Once | INTRAVENOUS | Status: AC
  Administered 2018-11-08: 11:00:00 660 mg via INTRAVENOUS
  Filled 2018-11-08: qty 33

## 2018-11-08 MED ORDER — SODIUM CHLORIDE 0.9 % IV SOLN
Freq: Once | INTRAVENOUS | Status: AC
  Administered 2018-11-08: 10:00:00 via INTRAVENOUS
  Filled 2018-11-08: qty 250

## 2018-11-08 MED ORDER — SODIUM CHLORIDE 0.9 % IV SOLN
375.0000 mg/m2 | Freq: Once | INTRAVENOUS | Status: AC
  Administered 2018-11-08: 700 mg via INTRAVENOUS
  Filled 2018-11-08: qty 50

## 2018-11-08 MED ORDER — HEPARIN SOD (PORK) LOCK FLUSH 100 UNIT/ML IV SOLN
500.0000 [IU] | Freq: Once | INTRAVENOUS | Status: AC | PRN
  Administered 2018-11-08: 500 [IU]
  Filled 2018-11-08: qty 5

## 2018-11-08 MED ORDER — SODIUM CHLORIDE 0.9% FLUSH
10.0000 mL | Freq: Once | INTRAVENOUS | Status: AC
  Administered 2018-11-08: 10 mL via INTRAVENOUS
  Filled 2018-11-08: qty 10

## 2018-11-08 MED ORDER — DIPHENHYDRAMINE HCL 25 MG PO CAPS
50.0000 mg | ORAL_CAPSULE | Freq: Once | ORAL | Status: AC
  Administered 2018-11-08: 10:00:00 50 mg via ORAL

## 2018-11-08 MED ORDER — ACETAMINOPHEN 325 MG PO TABS
ORAL_TABLET | ORAL | Status: AC
  Filled 2018-11-08: qty 1

## 2018-11-08 MED ORDER — DIPHENHYDRAMINE HCL 25 MG PO CAPS
ORAL_CAPSULE | ORAL | Status: AC
  Filled 2018-11-08: qty 2

## 2018-11-08 MED ORDER — ACETAMINOPHEN 325 MG PO TABS
650.0000 mg | ORAL_TABLET | Freq: Once | ORAL | Status: AC
  Administered 2018-11-08: 650 mg via ORAL

## 2018-11-08 MED ORDER — SODIUM CHLORIDE 0.9% FLUSH
10.0000 mL | INTRAVENOUS | Status: DC | PRN
  Administered 2018-11-08: 10 mL
  Filled 2018-11-08: qty 10

## 2018-11-08 MED ORDER — ACETAMINOPHEN 325 MG PO TABS
ORAL_TABLET | ORAL | Status: AC
  Filled 2018-11-08: qty 2

## 2018-11-08 NOTE — Patient Instructions (Signed)
Tunneled Central Venous Catheter Flushing Guide  It is important to flush your tunneled central venous catheter each time you use it, both before and after you use it. Flushing your catheter will help prevent it from clogging. What are the risks? Risks may include:  Infection.  Air getting into the catheter and bloodstream. Supplies needed:  A clean pair of gloves.  A disinfecting wipe. Use an alcohol wipe, chlorhexidine wipe, or iodine wipe as told by your health care provider.  A 10 mL syringe that has been prefilled with saline solution.  An empty 10 mL syringe, if a substance called heparin was injected into your catheter. How to flush your catheter When you flush your catheter, make sure you follow any specific instructions from your health care provider or the manufacturer. These are general guidelines. Flushing your catheter before use If there is heparin in your catheter: 1. Wash your hands with soap and water. 2. Put on gloves. 3. Scrub the injection cap for a minimum of 15 seconds with a disinfecting wipe. 4. Unclamp the catheter. 5. Attach the empty syringe to the injection cap. 6. Pull the syringe plunger back and withdraw 10 mL of blood. 7. Place the syringe into an appropriate waste container. 8. Scrub the injection cap for 15 seconds with a disinfecting wipe. 9. Attach the prefilled syringe to the injection cap. 10. Flush the catheter by pushing the plunger forward until all the liquid from the syringe is in the catheter. 11. Remove the syringe from the injection cap. 12. Clamp the catheter. If there is no heparin in your catheter: 1. Wash your hands with soap and water. 2. Put on gloves. 3. Scrub the injection cap for 15 seconds with a disinfecting wipe. 4. Unclamp the catheter. 5. Attach the prefilled syringe to the injection cap. 6. Flush the catheter by pushing the plunger forward until 5 mL of the liquid from the syringe is in the catheter. 7. Pull back on  the syringe until you see blood in the catheter. 8. If you have been asked to collect any blood, follow your health care provider's instructions. Otherwise, flush the catheter with the rest of the solution from the syringe. 9. Remove the syringe from the injection cap. 10. Clamp the catheter.  Flushing your catheter after use 1. Wash your hands with soap and water. 2. Put on gloves. 3. Scrub the injection cap for 15 seconds with a disinfecting wipe. 4. Unclamp the catheter. 5. Attach the prefilled syringe to the injection cap. 6. Flush the catheter by pushing the plunger forward until all of the liquid from the syringe is in the catheter. 7. Remove the syringe from the injection cap. 8. Clamp the catheter. Problems and solutions  If blood cannot be completely cleared from the injection cap, you may need to have the injection cap replaced.  If the catheter is difficult to flush, use the pulsing method. The pulsing method involves pushing only a few milliliters of solution into the catheter at a time and pausing between pushes.  If you do not see blood in the catheter when you pull back on the syringe, change your body position, such as by raising your arms above your head. Take a deep breath and cough. Then, pull back on the syringe. If you still do not see blood, flush the catheter with a small amount of solution. Then, change positions again and take a breath or cough. Pull back on the syringe again. If you still do not see   blood, finish flushing the catheter and contact your health care provider. Do not use your catheter until your health care provider says it is okay. General tips  Have someone help you flush your catheter, if possible.  Do not force fluid through your catheter.  Do not use a syringe that is larger or smaller than 10 mL. Using a smaller syringe can make the catheter burst.  Do not use your catheter without flushing it first if it has heparin in it. Contact a health  care provider if:  You cannot see any blood in the catheter when you flush it before using it.  Your catheter is difficult to flush. Get help right away if:  You cannot flush the catheter.  The catheter leaks when you flush it or when there is fluid in it.  There are cracks or breaks in the catheter. Summary  It is important to flush your tunneled central venous catheter each time you use it, both before and after you use it.  Scrub the injection cap for 15 seconds with a disinfecting wipe before and after you flush it.  When you flush your catheter, make sure you follow any specific instructions from your health care provider or the manufacturer.  Get help right away if you cannot flush the catheter. This information is not intended to replace advice given to you by your health care provider. Make sure you discuss any questions you have with your health care provider. Document Released: 01/27/2011 Document Revised: 04/25/2018 Document Reviewed: 04/25/2018 [employer] Patient Education  2020 [employer]  

## 2018-11-08 NOTE — Patient Instructions (Signed)
Rituximab injection What is this medicine? RITUXIMAB (ri TUX i mab) is a monoclonal antibody. It is used to treat certain types of cancer like non-Hodgkin lymphoma and chronic lymphocytic leukemia. It is also used to treat rheumatoid arthritis, granulomatosis with polyangiitis (or Wegener's granulomatosis), microscopic polyangiitis, and pemphigus vulgaris. This medicine may be used for other purposes; ask your health care provider or pharmacist if you have questions. COMMON BRAND NAME(S): Rituxan, RUXIENCE What should I tell my health care provider before I take this medicine? They need to know if you have any of these conditions:  heart disease  infection (especially a virus infection such as hepatitis B, chickenpox, cold sores, or herpes)  immune system problems  irregular heartbeat  kidney disease  low blood counts, like low white cell, platelet, or red cell counts  lung or breathing disease, like asthma  recently received or scheduled to receive a vaccine  an unusual or allergic reaction to rituximab, other medicines, foods, dyes, or preservatives  pregnant or trying to get pregnant  breast-feeding How should I use this medicine? This medicine is for infusion into a vein. It is administered in a hospital or clinic by a specially trained health care professional. A special MedGuide will be given to you by the pharmacist with each prescription and refill. Be sure to read this information carefully each time. Talk to your pediatrician regarding the use of this medicine in children. This medicine is not approved for use in children. Overdosage: If you think you have taken too much of this medicine contact a poison control center or emergency room at once. NOTE: This medicine is only for you. Do not share this medicine with others. What if I miss a dose? It is important not to miss a dose. Call your doctor or health care professional if you are unable to keep an appointment. What  may interact with this medicine?  cisplatin  live virus vaccines This list may not describe all possible interactions. Give your health care provider a list of all the medicines, herbs, non-prescription drugs, or dietary supplements you use. Also tell them if you smoke, drink alcohol, or use illegal drugs. Some items may interact with your medicine. What should I watch for while using this medicine? Your condition will be monitored carefully while you are receiving this medicine. You may need blood work done while you are taking this medicine. This medicine can cause serious allergic reactions. To reduce your risk you may need to take medicine before treatment with this medicine. Take your medicine as directed. In some patients, this medicine may cause a serious brain infection that may cause death. If you have any problems seeing, thinking, speaking, walking, or standing, tell your healthcare professional right away. If you cannot reach your healthcare professional, urgently seek other source of medical care. Call your doctor or health care professional for advice if you get a fever, chills or sore throat, or other symptoms of a cold or flu. Do not treat yourself. This drug decreases your body's ability to fight infections. Try to avoid being around people who are sick. Do not become pregnant while taking this medicine or for at least 12 months after stopping it. Women should inform their doctor if they wish to become pregnant or think they might be pregnant. There is a potential for serious side effects to an unborn child. Talk to your health care professional or pharmacist for more information. Do not breast-feed an infant while taking this medicine or for at   least 6 months after stopping it. What side effects may I notice from receiving this medicine? Side effects that you should report to your doctor or health care professional as soon as possible:  allergic reactions like skin rash, itching or  hives; swelling of the face, lips, or tongue  breathing problems  chest pain  changes in vision  diarrhea  headache with fever, neck stiffness, sensitivity to light, nausea, or confusion  fast, irregular heartbeat  loss of memory  low blood counts - this medicine may decrease the number of white blood cells, red blood cells and platelets. You may be at increased risk for infections and bleeding.  mouth sores  problems with balance, talking, or walking  redness, blistering, peeling or loosening of the skin, including inside the mouth  signs of infection - fever or chills, cough, sore throat, pain or difficulty passing urine  signs and symptoms of kidney injury like trouble passing urine or change in the amount of urine  signs and symptoms of liver injury like dark yellow or brown urine; general ill feeling or flu-like symptoms; light-colored stools; loss of appetite; nausea; right upper belly pain; unusually weak or tired; yellowing of the eyes or skin  signs and symptoms of low blood pressure like dizziness; feeling faint or lightheaded, falls; unusually weak or tired  stomach pain  swelling of the ankles, feet, hands  unusual bleeding or bruising  vomiting Side effects that usually do not require medical attention (report to your doctor or health care professional if they continue or are bothersome):  headache  joint pain  muscle cramps or muscle pain  nausea  tiredness This list may not describe all possible side effects. Call your doctor for medical advice about side effects. You may report side effects to FDA at 1-800-FDA-1088. Where should I keep my medicine? This drug is given in a hospital or clinic and will not be stored at home. NOTE: This sheet is a summary. It may not cover all possible information. If you have questions about this medicine, talk to your doctor, pharmacist, or health care provider.  2020 [employer]/Gold Standard (2018-03-21  22:01:36) Cyclophosphamide injection What is this medicine? CYCLOPHOSPHAMIDE (sye kloe FOSS fa mide) is a chemotherapy drug. It slows the growth of cancer cells. This medicine is used to treat many types of cancer like lymphoma, myeloma, leukemia, breast cancer, and ovarian cancer, to name a few. This medicine may be used for other purposes; ask your health care provider or pharmacist if you have questions. COMMON BRAND NAME(S): Cytoxan, Neosar What should I tell my health care provider before I take this medicine? They need to know if you have any of these conditions:  blood disorders  history of other chemotherapy  infection  kidney disease  liver disease  recent or ongoing radiation therapy  tumors in the bone marrow  an unusual or allergic reaction to cyclophosphamide, other chemotherapy, other medicines, foods, dyes, or preservatives  pregnant or trying to get pregnant  breast-feeding How should I use this medicine? This drug is usually given as an injection into a vein or muscle or by infusion into a vein. It is administered in a hospital or clinic by a specially trained health care professional. Talk to your pediatrician regarding the use of this medicine in children. Special care may be needed. Overdosage: If you think you have taken too much of this medicine contact a poison control center or emergency room at once. NOTE: This medicine is only for you.  Do not share this medicine with others. What if I miss a dose? It is important not to miss your dose. Call your doctor or health care professional if you are unable to keep an appointment. What may interact with this medicine? This medicine may interact with the following medications:  amiodarone  amphotericin B  azathioprine  certain antiviral medicines for HIV or AIDS such as protease inhibitors (e.g., indinavir, ritonavir) and zidovudine  certain blood pressure medications such as benazepril, captopril,  enalapril, fosinopril, lisinopril, moexipril, monopril, perindopril, quinapril, ramipril, trandolapril  certain cancer medications such as anthracyclines (e.g., daunorubicin, doxorubicin), busulfan, cytarabine, paclitaxel, pentostatin, tamoxifen, trastuzumab  certain diuretics such as chlorothiazide, chlorthalidone, hydrochlorothiazide, indapamide, metolazone  certain medicines that treat or prevent blood clots like warfarin  certain muscle relaxants such as succinylcholine  cyclosporine  etanercept  indomethacin  medicines to increase blood counts like filgrastim, pegfilgrastim, sargramostim  medicines used as general anesthesia  metronidazole  natalizumab This list may not describe all possible interactions. Give your health care provider a list of all the medicines, herbs, non-prescription drugs, or dietary supplements you use. Also tell them if you smoke, drink alcohol, or use illegal drugs. Some items may interact with your medicine. What should I watch for while using this medicine? Visit your doctor for checks on your progress. This drug may make you feel generally unwell. This is not uncommon, as chemotherapy can affect healthy cells as well as cancer cells. Report any side effects. Continue your course of treatment even though you feel ill unless your doctor tells you to stop. Drink water or other fluids as directed. Urinate often, even at night. In some cases, you may be given additional medicines to help with side effects. Follow all directions for their use. Call your doctor or health care professional for advice if you get a fever, chills or sore throat, or other symptoms of a cold or flu. Do not treat yourself. This drug decreases your body's ability to fight infections. Try to avoid being around people who are sick. This medicine may increase your risk to bruise or bleed. Call your doctor or health care professional if you notice any unusual bleeding. Be careful brushing  and flossing your teeth or using a toothpick because you may get an infection or bleed more easily. If you have any dental work done, tell your dentist you are receiving this medicine. You may get drowsy or dizzy. Do not drive, use machinery, or do anything that needs mental alertness until you know how this medicine affects you. Do not become pregnant while taking this medicine or for 1 year after stopping it. Women should inform their doctor if they wish to become pregnant or think they might be pregnant. Men should not father a child while taking this medicine and for 4 months after stopping it. There is a potential for serious side effects to an unborn child. Talk to your health care professional or pharmacist for more information. Do not breast-feed an infant while taking this medicine. This medicine may interfere with the ability to have a child. This medicine has caused ovarian failure in some women. This medicine has caused reduced sperm counts in some men. You should talk with your doctor or health care professional if you are concerned about your fertility. If you are going to have surgery, tell your doctor or health care professional that you have taken this medicine. What side effects may I notice from receiving this medicine? Side effects that you should report  to your doctor or health care professional as soon as possible:  allergic reactions like skin rash, itching or hives, swelling of the face, lips, or tongue  low blood counts - this medicine may decrease the number of white blood cells, red blood cells and platelets. You may be at increased risk for infections and bleeding.  signs of infection - fever or chills, cough, sore throat, pain or difficulty passing urine  signs of decreased platelets or bleeding - bruising, pinpoint red spots on the skin, black, tarry stools, blood in the urine  signs of decreased red blood cells - unusually weak or tired, fainting spells,  lightheadedness  breathing problems  dark urine  dizziness  palpitations  swelling of the ankles, feet, hands  trouble passing urine or change in the amount of urine  weight gain  yellowing of the eyes or skin Side effects that usually do not require medical attention (report to your doctor or health care professional if they continue or are bothersome):  changes in nail or skin color  hair loss  missed menstrual periods  mouth sores  nausea, vomiting This list may not describe all possible side effects. Call your doctor for medical advice about side effects. You may report side effects to FDA at 1-800-FDA-1088. Where should I keep my medicine? This drug is given in a hospital or clinic and will not be stored at home. NOTE: This sheet is a summary. It may not cover all possible information. If you have questions about this medicine, talk to your doctor, pharmacist, or health care provider.  2020 [employer]/Gold Standard (2011-12-23 16:22:58)

## 2018-11-08 NOTE — Progress Notes (Signed)
OK to treat today with Total bili-2.4 per order of Dr. Marin Olp.

## 2018-11-09 LAB — BETA 2 MICROGLOBULIN, SERUM: Beta-2 Microglobulin: 2.4 mg/L (ref 0.6–2.4)

## 2018-11-12 ENCOUNTER — Other Ambulatory Visit: Payer: Self-pay

## 2018-11-12 ENCOUNTER — Inpatient Hospital Stay: Payer: Medicare Other

## 2018-11-12 VITALS — BP 137/68 | HR 96 | Temp 97.5°F | Resp 19

## 2018-11-12 DIAGNOSIS — Z5112 Encounter for antineoplastic immunotherapy: Secondary | ICD-10-CM | POA: Diagnosis not present

## 2018-11-12 DIAGNOSIS — D500 Iron deficiency anemia secondary to blood loss (chronic): Secondary | ICD-10-CM

## 2018-11-12 DIAGNOSIS — D589 Hereditary hemolytic anemia, unspecified: Secondary | ICD-10-CM

## 2018-11-12 MED ORDER — SODIUM CHLORIDE 0.9% FLUSH
10.0000 mL | INTRAVENOUS | Status: DC | PRN
  Administered 2018-11-12: 12:00:00 10 mL via INTRAVENOUS
  Filled 2018-11-12: qty 10

## 2018-11-12 MED ORDER — SODIUM CHLORIDE 0.9 % IV SOLN
INTRAVENOUS | Status: DC
  Administered 2018-11-12: 09:00:00 via INTRAVENOUS
  Filled 2018-11-12: qty 250

## 2018-11-12 MED ORDER — SODIUM CHLORIDE 0.9 % IV SOLN: 510.0000 mg | Freq: Once | INTRAVENOUS | Status: DC

## 2018-11-12 MED ORDER — SODIUM CHLORIDE 0.9 % IV SOLN
200.0000 mg | Freq: Once | INTRAVENOUS | Status: AC
  Administered 2018-11-12: 200 mg via INTRAVENOUS
  Filled 2018-11-12: qty 10

## 2018-11-12 MED ORDER — HEPARIN SOD (PORK) LOCK FLUSH 100 UNIT/ML IV SOLN
500.0000 [IU] | Freq: Once | INTRAVENOUS | Status: AC | PRN
  Administered 2018-11-12: 12:00:00 500 [IU] via INTRAVENOUS
  Filled 2018-11-12: qty 5

## 2018-11-12 NOTE — Patient Instructions (Signed)

## 2018-11-19 ENCOUNTER — Other Ambulatory Visit: Payer: Self-pay

## 2018-11-19 ENCOUNTER — Inpatient Hospital Stay: Payer: Medicare Other

## 2018-11-19 VITALS — BP 124/63 | HR 85 | Temp 98.7°F | Resp 18

## 2018-11-19 DIAGNOSIS — D500 Iron deficiency anemia secondary to blood loss (chronic): Secondary | ICD-10-CM

## 2018-11-19 DIAGNOSIS — D589 Hereditary hemolytic anemia, unspecified: Secondary | ICD-10-CM

## 2018-11-19 DIAGNOSIS — Z5112 Encounter for antineoplastic immunotherapy: Secondary | ICD-10-CM | POA: Diagnosis not present

## 2018-11-19 MED ORDER — SODIUM CHLORIDE 0.9% FLUSH
10.0000 mL | INTRAVENOUS | Status: DC | PRN
  Administered 2018-11-19: 10 mL via INTRAVENOUS
  Filled 2018-11-19: qty 10

## 2018-11-19 MED ORDER — HEPARIN SOD (PORK) LOCK FLUSH 100 UNIT/ML IV SOLN
500.0000 [IU] | Freq: Once | INTRAVENOUS | Status: AC
  Administered 2018-11-19: 500 [IU] via INTRAVENOUS
  Filled 2018-11-19: qty 5

## 2018-11-19 MED ORDER — SODIUM CHLORIDE 0.9 % IV SOLN
200.0000 mg | Freq: Once | INTRAVENOUS | Status: AC
  Administered 2018-11-19: 200 mg via INTRAVENOUS
  Filled 2018-11-19: qty 10

## 2018-11-19 MED ORDER — SODIUM CHLORIDE 0.9 % IV SOLN
Freq: Once | INTRAVENOUS | Status: AC
  Administered 2018-11-19: 11:00:00 via INTRAVENOUS
  Filled 2018-11-19: qty 250

## 2018-11-19 NOTE — Addendum Note (Signed)
Addended by: Rico Ala on: 11/19/2018 12:54 PM   Modules accepted: Orders, SmartSet

## 2018-11-19 NOTE — Patient Instructions (Signed)

## 2018-11-26 ENCOUNTER — Ambulatory Visit: Payer: Medicare Other

## 2018-11-28 ENCOUNTER — Inpatient Hospital Stay: Payer: Medicare Other | Attending: Hematology & Oncology

## 2018-11-28 ENCOUNTER — Inpatient Hospital Stay: Payer: Medicare Other

## 2018-11-28 ENCOUNTER — Encounter: Payer: Self-pay | Admitting: Hematology & Oncology

## 2018-11-28 ENCOUNTER — Inpatient Hospital Stay (HOSPITAL_BASED_OUTPATIENT_CLINIC_OR_DEPARTMENT_OTHER): Payer: Medicare Other | Admitting: Hematology & Oncology

## 2018-11-28 ENCOUNTER — Other Ambulatory Visit: Payer: Self-pay

## 2018-11-28 ENCOUNTER — Other Ambulatory Visit: Payer: Self-pay | Admitting: *Deleted

## 2018-11-28 VITALS — BP 114/48 | HR 70 | Temp 97.5°F | Resp 18

## 2018-11-28 VITALS — BP 124/55 | HR 81 | Temp 97.4°F | Resp 20 | Wt 166.8 lb

## 2018-11-28 DIAGNOSIS — R200 Anesthesia of skin: Secondary | ICD-10-CM | POA: Diagnosis not present

## 2018-11-28 DIAGNOSIS — C8307 Small cell B-cell lymphoma, spleen: Secondary | ICD-10-CM

## 2018-11-28 DIAGNOSIS — Z79899 Other long term (current) drug therapy: Secondary | ICD-10-CM | POA: Diagnosis not present

## 2018-11-28 DIAGNOSIS — D509 Iron deficiency anemia, unspecified: Secondary | ICD-10-CM | POA: Diagnosis not present

## 2018-11-28 DIAGNOSIS — D589 Hereditary hemolytic anemia, unspecified: Secondary | ICD-10-CM

## 2018-11-28 DIAGNOSIS — I10 Essential (primary) hypertension: Secondary | ICD-10-CM | POA: Insufficient documentation

## 2018-11-28 DIAGNOSIS — R112 Nausea with vomiting, unspecified: Secondary | ICD-10-CM | POA: Insufficient documentation

## 2018-11-28 DIAGNOSIS — Z7901 Long term (current) use of anticoagulants: Secondary | ICD-10-CM | POA: Insufficient documentation

## 2018-11-28 DIAGNOSIS — R5383 Other fatigue: Secondary | ICD-10-CM | POA: Insufficient documentation

## 2018-11-28 DIAGNOSIS — E119 Type 2 diabetes mellitus without complications: Secondary | ICD-10-CM | POA: Insufficient documentation

## 2018-11-28 DIAGNOSIS — Z7984 Long term (current) use of oral hypoglycemic drugs: Secondary | ICD-10-CM | POA: Diagnosis not present

## 2018-11-28 DIAGNOSIS — D591 Autoimmune hemolytic anemia, unspecified: Secondary | ICD-10-CM | POA: Insufficient documentation

## 2018-11-28 DIAGNOSIS — C8580 Other specified types of non-Hodgkin lymphoma, unspecified site: Secondary | ICD-10-CM | POA: Insufficient documentation

## 2018-11-28 DIAGNOSIS — Z7982 Long term (current) use of aspirin: Secondary | ICD-10-CM | POA: Diagnosis not present

## 2018-11-28 DIAGNOSIS — Z5112 Encounter for antineoplastic immunotherapy: Secondary | ICD-10-CM | POA: Diagnosis present

## 2018-11-28 DIAGNOSIS — D599 Acquired hemolytic anemia, unspecified: Secondary | ICD-10-CM

## 2018-11-28 DIAGNOSIS — E785 Hyperlipidemia, unspecified: Secondary | ICD-10-CM | POA: Insufficient documentation

## 2018-11-28 DIAGNOSIS — E860 Dehydration: Secondary | ICD-10-CM | POA: Insufficient documentation

## 2018-11-28 DIAGNOSIS — D500 Iron deficiency anemia secondary to blood loss (chronic): Secondary | ICD-10-CM

## 2018-11-28 DIAGNOSIS — R0602 Shortness of breath: Secondary | ICD-10-CM | POA: Diagnosis not present

## 2018-11-28 DIAGNOSIS — R202 Paresthesia of skin: Secondary | ICD-10-CM | POA: Insufficient documentation

## 2018-11-28 LAB — CBC WITH DIFFERENTIAL (CANCER CENTER ONLY)
Abs Immature Granulocytes: 0.05 10*3/uL (ref 0.00–0.07)
Basophils Absolute: 0 10*3/uL (ref 0.0–0.1)
Basophils Relative: 0 %
Eosinophils Absolute: 0 10*3/uL (ref 0.0–0.5)
Eosinophils Relative: 1 %
HCT: 27.9 % — ABNORMAL LOW (ref 36.0–46.0)
Hemoglobin: 8.3 g/dL — ABNORMAL LOW (ref 12.0–15.0)
Immature Granulocytes: 1 %
Lymphocytes Relative: 8 %
Lymphs Abs: 0.4 10*3/uL — ABNORMAL LOW (ref 0.7–4.0)
MCH: 28.5 pg (ref 26.0–34.0)
MCHC: 29.7 g/dL — ABNORMAL LOW (ref 30.0–36.0)
MCV: 95.9 fL (ref 80.0–100.0)
Monocytes Absolute: 0.2 10*3/uL (ref 0.1–1.0)
Monocytes Relative: 4 %
Neutro Abs: 3.8 10*3/uL (ref 1.7–7.7)
Neutrophils Relative %: 86 %
Platelet Count: 154 10*3/uL (ref 150–400)
RBC: 2.91 MIL/uL — ABNORMAL LOW (ref 3.87–5.11)
RDW: 20 % — ABNORMAL HIGH (ref 11.5–15.5)
WBC Count: 4.5 10*3/uL (ref 4.0–10.5)
nRBC: 0 % (ref 0.0–0.2)

## 2018-11-28 LAB — CMP (CANCER CENTER ONLY)
ALT: 9 U/L (ref 0–44)
AST: 17 U/L (ref 15–41)
Albumin: 3.9 g/dL (ref 3.5–5.0)
Alkaline Phosphatase: 98 U/L (ref 38–126)
Anion gap: 9 (ref 5–15)
BUN: 13 mg/dL (ref 8–23)
CO2: 26 mmol/L (ref 22–32)
Calcium: 8.8 mg/dL — ABNORMAL LOW (ref 8.9–10.3)
Chloride: 106 mmol/L (ref 98–111)
Creatinine: 0.76 mg/dL (ref 0.44–1.00)
GFR, Est AFR Am: >60 mL/min (ref >60)
GFR, Estimated: >60 mL/min (ref >60)
Glucose, Bld: 125 mg/dL — ABNORMAL HIGH (ref 70–99)
Potassium: 3.7 mmol/L (ref 3.5–5.1)
Sodium: 141 mmol/L (ref 135–145)
Total Bilirubin: 2.2 mg/dL — ABNORMAL HIGH (ref 0.3–1.2)
Total Protein: 6 g/dL — ABNORMAL LOW (ref 6.5–8.1)

## 2018-11-28 LAB — PREPARE RBC (CROSSMATCH)

## 2018-11-28 LAB — SAMPLE TO BLOOD BANK

## 2018-11-28 MED ORDER — DIPHENHYDRAMINE HCL 25 MG PO CAPS
50.0000 mg | ORAL_CAPSULE | Freq: Once | ORAL | Status: AC
  Administered 2018-11-28: 09:00:00 50 mg via ORAL

## 2018-11-28 MED ORDER — SODIUM CHLORIDE 0.9 % IV SOLN
360.0000 mg/m2 | Freq: Once | INTRAVENOUS | Status: AC
  Administered 2018-11-28: 660 mg via INTRAVENOUS
  Filled 2018-11-28: qty 33

## 2018-11-28 MED ORDER — DIPHENHYDRAMINE HCL 25 MG PO CAPS
ORAL_CAPSULE | ORAL | Status: AC
  Filled 2018-11-28: qty 2

## 2018-11-28 MED ORDER — SODIUM CHLORIDE 0.9% FLUSH
10.0000 mL | INTRAVENOUS | Status: DC | PRN
  Administered 2018-11-28: 10 mL
  Filled 2018-11-28: qty 10

## 2018-11-28 MED ORDER — SODIUM CHLORIDE 0.9 % IV SOLN
375.0000 mg/m2 | Freq: Once | INTRAVENOUS | Status: AC
  Administered 2018-11-28: 11:00:00 700 mg via INTRAVENOUS
  Filled 2018-11-28: qty 50

## 2018-11-28 MED ORDER — HEPARIN SOD (PORK) LOCK FLUSH 100 UNIT/ML IV SOLN
500.0000 [IU] | Freq: Once | INTRAVENOUS | Status: AC | PRN
  Administered 2018-11-28: 14:00:00 500 [IU]
  Filled 2018-11-28: qty 5

## 2018-11-28 MED ORDER — SODIUM CHLORIDE 0.9% FLUSH
10.0000 mL | INTRAVENOUS | Status: DC | PRN
  Administered 2018-11-28: 10 mL via INTRAVENOUS
  Filled 2018-11-28: qty 10

## 2018-11-28 MED ORDER — ACETAMINOPHEN 325 MG PO TABS
650.0000 mg | ORAL_TABLET | Freq: Once | ORAL | Status: AC
  Administered 2018-11-28: 09:00:00 650 mg via ORAL

## 2018-11-28 MED ORDER — SODIUM CHLORIDE 0.9 % IV SOLN
200.0000 mg | Freq: Once | INTRAVENOUS | Status: AC
  Administered 2018-11-28: 09:00:00 via INTRAVENOUS
  Filled 2018-11-28: qty 10

## 2018-11-28 MED ORDER — SODIUM CHLORIDE 0.9 % IV SOLN
INTRAVENOUS | Status: DC
  Administered 2018-11-28: 09:00:00 via INTRAVENOUS
  Filled 2018-11-28: qty 250

## 2018-11-28 MED ORDER — ACETAMINOPHEN 325 MG PO TABS
ORAL_TABLET | ORAL | Status: AC
  Filled 2018-11-28: qty 2

## 2018-11-28 NOTE — Patient Instructions (Signed)

## 2018-11-28 NOTE — Patient Instructions (Addendum)
[facility] Discharge Instructions for Patients Receiving Chemotherapy  Today you received the following chemotherapy agents Rituxan/Cytoxan To help prevent nausea and vomiting after your treatment, we encourage you to take your nausea medication as prescribed.  If you develop nausea and vomiting that is not controlled by your nausea medication, call the clinic.   BELOW ARE SYMPTOMS THAT SHOULD BE REPORTED IMMEDIATELY:  *FEVER GREATER THAN 100.5 F  *CHILLS WITH OR WITHOUT FEVER  NAUSEA AND VOMITING THAT IS NOT CONTROLLED WITH YOUR NAUSEA MEDICATION  *UNUSUAL SHORTNESS OF BREATH  *UNUSUAL BRUISING OR BLEEDING  TENDERNESS IN MOUTH AND THROAT WITH OR WITHOUT PRESENCE OF ULCERS  *URINARY PROBLEMS  *BOWEL PROBLEMS  UNUSUAL RASH Items with * indicate a potential emergency and should be followed up as soon as possible.  Feel free to call the clinic should you have any questions or concerns. The clinic phone number is [phone].  Please show the [place] at check-in to the Emergency Department and triage nurse.  Iron Sucrose injection (Venofer) What is this medicine? IRON SUCROSE (AHY ern SOO krohs) is an iron complex. Iron is used to make healthy red blood cells, which carry oxygen and nutrients throughout the body. This medicine is used to treat iron deficiency anemia in people with chronic kidney disease. This medicine may be used for other purposes; ask your health care provider or pharmacist if you have questions. COMMON BRAND NAME(S): Venofer What should I tell my health care provider before I take this medicine? They need to know if you have any of these conditions:  anemia not caused by low iron levels  heart disease  high levels of iron in the blood  kidney disease  liver disease  an unusual or allergic reaction to iron, other medicines, foods, dyes, or preservatives  pregnant or trying to get pregnant  breast-feeding How should I use  this medicine? This medicine is for infusion into a vein. It is given by a health care professional in a hospital or clinic setting. Talk to your pediatrician regarding the use of this medicine in children. While this drug may be prescribed for children as young as 2 years for selected conditions, precautions do apply. Overdosage: If you think you have taken too much of this medicine contact a poison control center or emergency room at once. NOTE: This medicine is only for you. Do not share this medicine with others. What if I miss a dose? It is important not to miss your dose. Call your doctor or health care professional if you are unable to keep an appointment. What may interact with this medicine? Do not take this medicine with any of the following medications:  deferoxamine  dimercaprol  other iron products This medicine may also interact with the following medications:  chloramphenicol  deferasirox This list may not describe all possible interactions. Give your health care provider a list of all the medicines, herbs, non-prescription drugs, or dietary supplements you use. Also tell them if you smoke, drink alcohol, or use illegal drugs. Some items may interact with your medicine. What should I watch for while using this medicine? Visit your doctor or healthcare professional regularly. Tell your doctor or healthcare professional if your symptoms do not start to get better or if they get worse. You may need blood work done while you are taking this medicine. You may need to follow a special diet. Talk to your doctor. Foods that contain iron include: whole grains/cereals, dried fruits, beans, or  peas, leafy green vegetables, and organ meats (liver, kidney). What side effects may I notice from receiving this medicine? Side effects that you should report to your doctor or health care professional as soon as possible:  allergic reactions like skin rash, itching or hives, swelling of the  face, lips, or tongue  breathing problems  changes in blood pressure  cough  fast, irregular heartbeat  feeling faint or lightheaded, falls  fever or chills  flushing, sweating, or hot feelings  joint or muscle aches/pains  seizures  swelling of the ankles or feet  unusually weak or tired Side effects that usually do not require medical attention (report to your doctor or health care professional if they continue or are bothersome):  diarrhea  feeling achy  headache  irritation at site where injected  nausea, vomiting  stomach upset  tiredness This list may not describe all possible side effects. Call your doctor for medical advice about side effects. You may report side effects to FDA at 1-800-FDA-1088. Where should I keep my medicine? This drug is given in a hospital or clinic and will not be stored at home. NOTE: This sheet is a summary. It may not cover all possible information. If you have questions about this medicine, talk to your doctor, pharmacist, or health care provider.  2020 Elsevier/Gold Standard (2010-11-18 17:14:35)   Iron Sucrose injection What is this medicine? IRON SUCROSE (AHY ern SOO krohs) is an iron complex. Iron is used to make healthy red blood cells, which carry oxygen and nutrients throughout the body. This medicine is used to treat iron deficiency anemia in people with chronic kidney disease. This medicine may be used for other purposes; ask your health care provider or pharmacist if you have questions. COMMON BRAND NAME(S): Venofer What should I tell my health care provider before I take this medicine? They need to know if you have any of these conditions:  anemia not caused by low iron levels  heart disease  high levels of iron in the blood  kidney disease  liver disease  an unusual or allergic reaction to iron, other medicines, foods, dyes, or preservatives  pregnant or trying to get pregnant  breast-feeding How  should I use this medicine? This medicine is for infusion into a vein. It is given by a health care professional in a hospital or clinic setting. Talk to your pediatrician regarding the use of this medicine in children. While this drug may be prescribed for children as young as 2 years for selected conditions, precautions do apply. Overdosage: If you think you have taken too much of this medicine contact a poison control center or emergency room at once. NOTE: This medicine is only for you. Do not share this medicine with others. What if I miss a dose? It is important not to miss your dose. Call your doctor or health care professional if you are unable to keep an appointment. What may interact with this medicine? Do not take this medicine with any of the following medications:  deferoxamine  dimercaprol  other iron products This medicine may also interact with the following medications:  chloramphenicol  deferasirox This list may not describe all possible interactions. Give your health care provider a list of all the medicines, herbs, non-prescription drugs, or dietary supplements you use. Also tell them if you smoke, drink alcohol, or use illegal drugs. Some items may interact with your medicine. What should I watch for while using this medicine? Visit your doctor or healthcare professional  regularly. Tell your doctor or healthcare professional if your symptoms do not start to get better or if they get worse. You may need blood work done while you are taking this medicine. You may need to follow a special diet. Talk to your doctor. Foods that contain iron include: whole grains/cereals, dried fruits, beans, or peas, leafy green vegetables, and organ meats (liver, kidney). What side effects may I notice from receiving this medicine? Side effects that you should report to your doctor or health care professional as soon as possible:  allergic reactions like skin rash, itching or hives,  swelling of the face, lips, or tongue  breathing problems  changes in blood pressure  cough  fast, irregular heartbeat  feeling faint or lightheaded, falls  fever or chills  flushing, sweating, or hot feelings  joint or muscle aches/pains  seizures  swelling of the ankles or feet  unusually weak or tired Side effects that usually do not require medical attention (report to your doctor or health care professional if they continue or are bothersome):  diarrhea  feeling achy  headache  irritation at site where injected  nausea, vomiting  stomach upset  tiredness This list may not describe all possible side effects. Call your doctor for medical advice about side effects. You may report side effects to FDA at 1-800-FDA-1088. Where should I keep my medicine? This drug is given in a hospital or clinic and will not be stored at home. NOTE: This sheet is a summary. It may not cover all possible information. If you have questions about this medicine, talk to your doctor, pharmacist, or health care provider.  2020 Elsevier/Gold Standard (2010-11-18 17:14:35)  Cyclophosphamide injection What is this medicine? CYCLOPHOSPHAMIDE (sye kloe FOSS fa mide) is a chemotherapy drug. It slows the growth of cancer cells. This medicine is used to treat many types of cancer like lymphoma, myeloma, leukemia, breast cancer, and ovarian cancer, to name a few. This medicine may be used for other purposes; ask your health care provider or pharmacist if you have questions. COMMON BRAND NAME(S): Cytoxan, Neosar What should I tell my health care provider before I take this medicine? They need to know if you have any of these conditions:  blood disorders  history of other chemotherapy  infection  kidney disease  liver disease  recent or ongoing radiation therapy  tumors in the bone marrow  an unusual or allergic reaction to cyclophosphamide, other chemotherapy, other medicines, foods,  dyes, or preservatives  pregnant or trying to get pregnant  breast-feeding How should I use this medicine? This drug is usually given as an injection into a vein or muscle or by infusion into a vein. It is administered in a hospital or clinic by a specially trained health care professional. Talk to your pediatrician regarding the use of this medicine in children. Special care may be needed. Overdosage: If you think you have taken too much of this medicine contact a poison control center or emergency room at once. NOTE: This medicine is only for you. Do not share this medicine with others. What if I miss a dose? It is important not to miss your dose. Call your doctor or health care professional if you are unable to keep an appointment. What may interact with this medicine? This medicine may interact with the following medications:  amiodarone  amphotericin B  azathioprine  certain antiviral medicines for HIV or AIDS such as protease inhibitors (e.g., indinavir, ritonavir) and zidovudine  certain blood pressure medications  such as benazepril, captopril, enalapril, fosinopril, lisinopril, moexipril, monopril, perindopril, quinapril, ramipril, trandolapril  certain cancer medications such as anthracyclines (e.g., daunorubicin, doxorubicin), busulfan, cytarabine, paclitaxel, pentostatin, tamoxifen, trastuzumab  certain diuretics such as chlorothiazide, chlorthalidone, hydrochlorothiazide, indapamide, metolazone  certain medicines that treat or prevent blood clots like warfarin  certain muscle relaxants such as succinylcholine  cyclosporine  etanercept  indomethacin  medicines to increase blood counts like filgrastim, pegfilgrastim, sargramostim  medicines used as general anesthesia  metronidazole  natalizumab This list may not describe all possible interactions. Give your health care provider a list of all the medicines, herbs, non-prescription drugs, or dietary supplements  you use. Also tell them if you smoke, drink alcohol, or use illegal drugs. Some items may interact with your medicine. What should I watch for while using this medicine? Visit your doctor for checks on your progress. This drug may make you feel generally unwell. This is not uncommon, as chemotherapy can affect healthy cells as well as cancer cells. Report any side effects. Continue your course of treatment even though you feel ill unless your doctor tells you to stop. Drink water or other fluids as directed. Urinate often, even at night. In some cases, you may be given additional medicines to help with side effects. Follow all directions for their use. Call your doctor or health care professional for advice if you get a fever, chills or sore throat, or other symptoms of a cold or flu. Do not treat yourself. This drug decreases your body's ability to fight infections. Try to avoid being around people who are sick. This medicine may increase your risk to bruise or bleed. Call your doctor or health care professional if you notice any unusual bleeding. Be careful brushing and flossing your teeth or using a toothpick because you may get an infection or bleed more easily. If you have any dental work done, tell your dentist you are receiving this medicine. You may get drowsy or dizzy. Do not drive, use machinery, or do anything that needs mental alertness until you know how this medicine affects you. Do not become pregnant while taking this medicine or for 1 year after stopping it. Women should inform their doctor if they wish to become pregnant or think they might be pregnant. Men should not father a child while taking this medicine and for 4 months after stopping it. There is a potential for serious side effects to an unborn child. Talk to your health care professional or pharmacist for more information. Do not breast-feed an infant while taking this medicine. This medicine may interfere with the ability to  have a child. This medicine has caused ovarian failure in some women. This medicine has caused reduced sperm counts in some men. You should talk with your doctor or health care professional if you are concerned about your fertility. If you are going to have surgery, tell your doctor or health care professional that you have taken this medicine. What side effects may I notice from receiving this medicine? Side effects that you should report to your doctor or health care professional as soon as possible:  allergic reactions like skin rash, itching or hives, swelling of the face, lips, or tongue  low blood counts - this medicine may decrease the number of white blood cells, red blood cells and platelets. You may be at increased risk for infections and bleeding.  signs of infection - fever or chills, cough, sore throat, pain or difficulty passing urine  signs of decreased platelets or  bleeding - bruising, pinpoint red spots on the skin, black, tarry stools, blood in the urine  signs of decreased red blood cells - unusually weak or tired, fainting spells, lightheadedness  breathing problems  dark urine  dizziness  palpitations  swelling of the ankles, feet, hands  trouble passing urine or change in the amount of urine  weight gain  yellowing of the eyes or skin Side effects that usually do not require medical attention (report to your doctor or health care professional if they continue or are bothersome):  changes in nail or skin color  hair loss  missed menstrual periods  mouth sores  nausea, vomiting This list may not describe all possible side effects. Call your doctor for medical advice about side effects. You may report side effects to FDA at 1-800-FDA-1088. Where should I keep my medicine? This drug is given in a hospital or clinic and will not be stored at home. NOTE: This sheet is a summary. It may not cover all possible information. If you have questions about this  medicine, talk to your doctor, pharmacist, or health care provider.  2020 Elsevier/Gold Standard (2011-12-23 16:22:58)  Rituximab injection What is this medicine? RITUXIMAB (ri TUX i mab) is a monoclonal antibody. It is used to treat certain types of cancer like non-Hodgkin lymphoma and chronic lymphocytic leukemia. It is also used to treat rheumatoid arthritis, granulomatosis with polyangiitis (or Wegener's granulomatosis), microscopic polyangiitis, and pemphigus vulgaris. This medicine may be used for other purposes; ask your health care provider or pharmacist if you have questions. COMMON BRAND NAME(S): Rituxan, RUXIENCE What should I tell my health care provider before I take this medicine? They need to know if you have any of these conditions:  heart disease  infection (especially a virus infection such as hepatitis B, chickenpox, cold sores, or herpes)  immune system problems  irregular heartbeat  kidney disease  low blood counts, like low white cell, platelet, or red cell counts  lung or breathing disease, like asthma  recently received or scheduled to receive a vaccine  an unusual or allergic reaction to rituximab, other medicines, foods, dyes, or preservatives  pregnant or trying to get pregnant  breast-feeding How should I use this medicine? This medicine is for infusion into a vein. It is administered in a hospital or clinic by a specially trained health care professional. A special MedGuide will be given to you by the pharmacist with each prescription and refill. Be sure to read this information carefully each time. Talk to your pediatrician regarding the use of this medicine in children. This medicine is not approved for use in children. Overdosage: If you think you have taken too much of this medicine contact a poison control center or emergency room at once. NOTE: This medicine is only for you. Do not share this medicine with others. What if I miss a dose? It is  important not to miss a dose. Call your doctor or health care professional if you are unable to keep an appointment. What may interact with this medicine?  cisplatin  live virus vaccines This list may not describe all possible interactions. Give your health care provider a list of all the medicines, herbs, non-prescription drugs, or dietary supplements you use. Also tell them if you smoke, drink alcohol, or use illegal drugs. Some items may interact with your medicine. What should I watch for while using this medicine? Your condition will be monitored carefully while you are receiving this medicine. You may need blood  work done while you are taking this medicine. This medicine can cause serious allergic reactions. To reduce your risk you may need to take medicine before treatment with this medicine. Take your medicine as directed. In some patients, this medicine may cause a serious brain infection that may cause death. If you have any problems seeing, thinking, speaking, walking, or standing, tell your healthcare professional right away. If you cannot reach your healthcare professional, urgently seek other source of medical care. Call your doctor or health care professional for advice if you get a fever, chills or sore throat, or other symptoms of a cold or flu. Do not treat yourself. This drug decreases your body's ability to fight infections. Try to avoid being around people who are sick. Do not become pregnant while taking this medicine or for at least 12 months after stopping it. Women should inform their doctor if they wish to become pregnant or think they might be pregnant. There is a potential for serious side effects to an unborn child. Talk to your health care professional or pharmacist for more information. Do not breast-feed an infant while taking this medicine or for at least 6 months after stopping it. What side effects may I notice from receiving this medicine? Side effects that you  should report to your doctor or health care professional as soon as possible:  allergic reactions like skin rash, itching or hives; swelling of the face, lips, or tongue  breathing problems  chest pain  changes in vision  diarrhea  headache with fever, neck stiffness, sensitivity to light, nausea, or confusion  fast, irregular heartbeat  loss of memory  low blood counts - this medicine may decrease the number of white blood cells, red blood cells and platelets. You may be at increased risk for infections and bleeding.  mouth sores  problems with balance, talking, or walking  redness, blistering, peeling or loosening of the skin, including inside the mouth  signs of infection - fever or chills, cough, sore throat, pain or difficulty passing urine  signs and symptoms of kidney injury like trouble passing urine or change in the amount of urine  signs and symptoms of liver injury like dark yellow or brown urine; general ill feeling or flu-like symptoms; light-colored stools; loss of appetite; nausea; right upper belly pain; unusually weak or tired; yellowing of the eyes or skin  signs and symptoms of low blood pressure like dizziness; feeling faint or lightheaded, falls; unusually weak or tired  stomach pain  swelling of the ankles, feet, hands  unusual bleeding or bruising  vomiting Side effects that usually do not require medical attention (report to your doctor or health care professional if they continue or are bothersome):  headache  joint pain  muscle cramps or muscle pain  nausea  tiredness This list may not describe all possible side effects. Call your doctor for medical advice about side effects. You may report side effects to FDA at 1-800-FDA-1088. Where should I keep my medicine? This drug is given in a hospital or clinic and will not be stored at home. NOTE: This sheet is a summary. It may not cover all possible information. If you have questions about  this medicine, talk to your doctor, pharmacist, or health care provider.  2020 Elsevier/Gold Standard (2018-03-21 22:01:36)

## 2018-11-28 NOTE — Progress Notes (Signed)
Hematology and Oncology Follow Up Visit  Stellar Penfold [id number] 06/13/1931 85 y.o. 11/28/2018   Principle Diagnosis:  Marginal zone lymphoma -- recurrent Iron deficiency anemia  Current Therapy:  Rituxan/Cytoxan -- s/p cycle #1/4 - started on 11/08/2018  IV Iron as needed  Past therapy: CVP -- completed 8 cycles on 04/09/2012  Maintenance Rituxan - 2 years completed in June 2016 IV iron - last dose on 06/2018    Interim History:  Ms. Goldson is here today for follow-up.  She had her first cycle of treatment.  She did okay with it.  She now is feeling a little bit tired.  She is having little bit of nausea.  Her hemoglobin is down to 8.3.  She is not having any melena or bright red blood per rectum.  She is on Eliquis.  There is no hematemesis or hemoptysis.  I suspect this probably is from her chemotherapy.  I think that we are probably going to have to transfuse her given her age.  I am going to check her erythropoietin level.  She is getting iron today.  She is has some abdominal upset and.  She lost her nausea medicine.  I will send back in some Zofran.  She has had no issues with the angioedema.  Overall, her performance status is ECOG 1.  swelling.  Overall, I would say her performance status is ECOG 1.     Medications:  Allergies as of 11/28/2018      Reactions   Lisinopril Swelling   Facial swelling  Facial swelling    Timolol Other (See Comments), Swelling   Pregabalin Other (See Comments)   constipation constipation constipation      Medication List       Accurate as of November 28, 2018  9:07 AM. If you have any questions, ask your nurse or doctor.        alendronate 70 MG tablet Commonly known as: FOSAMAX Take by mouth once a week.   amLODipine 5 MG tablet Commonly known as: NORVASC Take 5 mg by mouth daily.   apixaban 5 MG Tabs tablet Commonly known as: ELIQUIS Take 5 mg by mouth 2 (two) times daily.   aspirin 81 MG tablet Take 81 mg  by mouth daily.   atorvastatin 80 MG tablet Commonly known as: LIPITOR Take 40 mg by mouth daily.   Calcium 600-D 600-400 MG-UNIT Tabs Generic drug: Calcium Carbonate-Vitamin D3 Take by mouth.   diphenhydrAMINE 25 MG tablet Commonly known as: BENADRYL Take 25 mg by mouth at bedtime as needed.   dorzolamide 2 % ophthalmic solution Commonly known as: TRUSOPT Place 1 drop into both eyes 3 (three) times daily.   ferrous sulfate 325 (65 FE) MG tablet Take 325 mg by mouth daily.   furosemide 40 MG tablet Commonly known as: LASIX Take 40 mg by mouth daily.   gabapentin 300 MG capsule Commonly known as: NEURONTIN Take 1 capsule (300 mg total) by mouth at bedtime. 90 day supply   ipratropium-albuterol 0.5-2.5 (3) MG/3ML Soln Commonly known as: DUONEB INHALE 3 ML BY NEBULIZATION EVERY SIX (6) HOURS AS NEEDED.   latanoprost 0.005 % ophthalmic solution Commonly known as: XALATAN   lidocaine-prilocaine cream Commonly known as: EMLA Apply 1 application topically as needed.   loratadine 10 MG tablet Commonly known as: CLARITIN OTC Take by mouth one a day for 10 days, then once a day as needed -allergies.   metFORMIN 500 MG tablet Commonly known as: GLUCOPHAGE Take 500 mg  by mouth daily with breakfast.   potassium chloride 10 MEQ tablet Commonly known as: KLOR-CON Take 10 mEq by mouth daily.   Propylene Glycol 0.6 % Soln 1 drop.   raloxifene 60 MG tablet Commonly known as: EVISTA Take 60 mg by mouth daily.   ranitidine 150 MG tablet Commonly known as: ZANTAC 1 tablet by mouth twice a day for month and then take twice a day for needed - GERD   tiZANidine 4 MG capsule Commonly known as: ZANAFLEX Take 4 mg by mouth at bedtime.   Vitamin D (Ergocalciferol) 1.25 MG (50000 UT) Caps capsule Commonly known as: DRISDOL TAKE ONE CAPSULE BY MOUTH 3 DAYS WEEK WITH A MEAL FOR LOW VITAMIN D       Allergies:  Allergies  Allergen Reactions  . Lisinopril Swelling     Facial swelling  Facial swelling    . Timolol Other (See Comments) and Swelling  . Pregabalin Other (See Comments)    constipation constipation  constipation    Past Medical History, Surgical history, Social history, and Family History were reviewed and updated.  Review of Systems: Review of Systems  Constitutional: Negative.   HENT: Negative.   Eyes: Negative.   Respiratory: Negative.   Cardiovascular: Negative.   Gastrointestinal: Negative.   Genitourinary: Negative.   Musculoskeletal: Negative.   Skin: Negative.   Neurological: Negative.   Endo/Heme/Allergies: Negative.   Psychiatric/Behavioral: Negative.      Physical Exam:  weight is 166 lb 12.8 oz (75.7 kg). Her oral temperature is 97.4 F (36.3 C) (abnormal). Her blood pressure is 124/55 (abnormal) and her pulse is 81. Her respiration is 20 and oxygen saturation is 96%.   Wt Readings from Last 3 Encounters:  11/28/18 166 lb 12.8 oz (75.7 kg)  11/02/18 171 lb 12 oz (77.9 kg)  06/28/18 171 lb (77.6 kg)    Physical Exam Vitals signs reviewed.  HENT:     Head: Normocephalic and atraumatic.  Eyes:     Pupils: Pupils are equal, round, and reactive to light.  Neck:     Musculoskeletal: Normal range of motion.  Cardiovascular:     Rate and Rhythm: Normal rate and regular rhythm.     Heart sounds: Normal heart sounds.  Pulmonary:     Effort: Pulmonary effort is normal.     Breath sounds: Normal breath sounds.  Abdominal:     General: Bowel sounds are normal.     Palpations: Abdomen is soft.  Musculoskeletal: Normal range of motion.        General: No tenderness or deformity.  Lymphadenopathy:     Cervical: No cervical adenopathy.  Skin:    General: Skin is warm and dry.     Findings: No erythema or rash.  Neurological:     Mental Status: She is alert and oriented to person, place, and time.  Psychiatric:        Behavior: Behavior normal.        Thought Content: Thought content normal.         Judgment: Judgment normal.     Lab Results  Component Value Date   WBC 4.5 11/28/2018   HGB 8.3 (L) 11/28/2018   HCT 27.9 (L) 11/28/2018   MCV 95.9 11/28/2018   PLT 154 11/28/2018   Lab Results  Component Value Date   FERRITIN 310 (H) 11/08/2018   IRON 61 11/08/2018   TIBC 335 11/08/2018   UIBC 274 11/08/2018   IRONPCTSAT 18 (L) 11/08/2018   Lab Results  Component Value Date   RETICCTPCT 1.6 06/28/2018   RBC 2.91 (L) 11/28/2018   RETICCTABS 59.2 11/13/2013   No results found for: Nils Pyle, [facility] Lab Results  Component Value Date   IGGSERUM 557 (L) 06/19/2013   IGA 100 06/19/2013   IGMSERUM 95 06/19/2013   Lab Results  Component Value Date   TOTALPROTELP 6.3 09/16/2011   ALBUMINELP 59.9 09/16/2011   A1GS 7.3 (H) 09/16/2011   A2GS 9.9 09/16/2011   BETS 8.4 (H) 09/16/2011   BETA2SER 4.8 09/16/2011   GAMS 9.7 (L) 09/16/2011   MSPIKE NOT DET 09/16/2011   SPEI * 09/16/2011     Chemistry      Component Value Date/Time   NA 141 11/28/2018 0800   NA 140 09/16/2016 0945   K 3.7 11/28/2018 0800   K 3.2 (L) 09/16/2016 0945   CL 106 11/28/2018 0800   CL 101 12/08/2014 1138   CL 99 05/28/2012 1036   CO2 26 11/28/2018 0800   CO2 26 09/16/2016 0945   BUN 13 11/28/2018 0800   BUN 9.0 09/16/2016 0945   CREATININE 0.76 11/28/2018 0800   CREATININE 0.7 09/16/2016 0945      Component Value Date/Time   CALCIUM 8.8 (L) 11/28/2018 0800   CALCIUM 9.0 09/16/2016 0945   ALKPHOS 98 11/28/2018 0800   ALKPHOS 122 09/16/2016 0945   AST 17 11/28/2018 0800   AST 25 09/16/2016 0945   ALT 9 11/28/2018 0800   ALT 15 09/16/2016 0945   BILITOT 2.2 (H) 11/28/2018 0800   BILITOT 0.90 09/16/2016 0945     Impression and Plan: Ms. Spoonamore is a very pleasant 85 yo African American female with a history of marginal zone lymphoma, angioedema and autoimmune hemolytic anemia.    I am mostly worried about the anemia today. I think this is from the chemotherapy  itself.  We will still go ahead with her second cycle of treatment.  We will have to set her up with a transfusion.  I had to make arrangements for all this.  I talked her about the blood transfusion.  She has had 1 before.  I would like to check her erythropoietin level and see if this might be low.  We will plan to get her back in 3 weeks.  I spent about 30 minutes with her today.    [name], MD 10/7/20209:07 AM

## 2018-11-29 ENCOUNTER — Other Ambulatory Visit: Payer: Self-pay | Admitting: *Deleted

## 2018-11-29 ENCOUNTER — Inpatient Hospital Stay: Payer: Medicare Other

## 2018-11-29 VITALS — BP 122/67 | HR 72 | Temp 97.2°F | Resp 18

## 2018-11-29 DIAGNOSIS — D599 Acquired hemolytic anemia, unspecified: Secondary | ICD-10-CM

## 2018-11-29 DIAGNOSIS — Z5112 Encounter for antineoplastic immunotherapy: Secondary | ICD-10-CM | POA: Diagnosis not present

## 2018-11-29 DIAGNOSIS — E860 Dehydration: Secondary | ICD-10-CM

## 2018-11-29 DIAGNOSIS — R112 Nausea with vomiting, unspecified: Secondary | ICD-10-CM

## 2018-11-29 MED ORDER — HEPARIN SOD (PORK) LOCK FLUSH 100 UNIT/ML IV SOLN
250.0000 [IU] | INTRAVENOUS | Status: DC | PRN
  Filled 2018-11-29: qty 5

## 2018-11-29 MED ORDER — SODIUM CHLORIDE 0.9 % IV SOLN: 8.0000 mg | Freq: Once | INTRAVENOUS | Status: DC

## 2018-11-29 MED ORDER — DEXAMETHASONE SODIUM PHOSPHATE 10 MG/ML IJ SOLN
INTRAMUSCULAR | Status: AC
  Filled 2018-11-29: qty 1

## 2018-11-29 MED ORDER — SODIUM CHLORIDE 0.9 % IV SOLN
Freq: Once | INTRAVENOUS | Status: AC
  Administered 2018-11-29: 12:00:00 via INTRAVENOUS
  Filled 2018-11-29: qty 8

## 2018-11-29 MED ORDER — SODIUM CHLORIDE 0.9% IV SOLUTION
250.0000 mL | Freq: Once | INTRAVENOUS | Status: DC
  Filled 2018-11-29: qty 250

## 2018-11-29 MED ORDER — ONDANSETRON HCL 4 MG/2ML IJ SOLN: 16.0000 mg | Freq: Once | INTRAMUSCULAR | Status: DC

## 2018-11-29 MED ORDER — ONDANSETRON HCL 8 MG PO TABS: 8.0000 mg | ORAL_TABLET | Freq: Three times a day (TID) | ORAL | 0 refills | Status: DC | PRN

## 2018-11-29 MED ORDER — SODIUM CHLORIDE 0.9% FLUSH
10.0000 mL | INTRAVENOUS | Status: DC | PRN
  Filled 2018-11-29: qty 10

## 2018-11-29 NOTE — Patient Instructions (Addendum)
Anemia  Anemia is a condition in which you do not have enough red blood cells or hemoglobin. Hemoglobin is a substance in red blood cells that carries oxygen. When you do not have enough red blood cells or hemoglobin (are anemic), your body cannot get enough oxygen and your organs may not work properly. As a result, you may feel very tired or have other problems. What are the causes? Common causes of anemia include:  Excessive bleeding. Anemia can be caused by excessive bleeding inside or outside the body, including bleeding from the intestine or from periods in women.  Poor nutrition.  Long-lasting (chronic) kidney, thyroid, and liver disease.  Bone marrow disorders.  Cancer and treatments for cancer.  HIV (human immunodeficiency virus) and AIDS (acquired immunodeficiency syndrome).  Treatments for HIV and AIDS.  Spleen problems.  Blood disorders.  Infections, medicines, and autoimmune disorders that destroy red blood cells. What are the signs or symptoms? Symptoms of this condition include:  Minor weakness.  Dizziness.  Headache.  Feeling heartbeats that are irregular or faster than normal (palpitations).  Shortness of breath, especially with exercise.  Paleness.  Cold sensitivity.  Indigestion.  Nausea.  Difficulty sleeping.  Difficulty concentrating. Symptoms may occur suddenly or develop slowly. If your anemia is mild, you may not have symptoms. How is this diagnosed? This condition is diagnosed based on:  Blood tests.  Your medical history.  A physical exam.  Bone marrow biopsy. Your health care provider may also check your stool (feces) for blood and may do additional testing to look for the cause of your bleeding. You may also have other tests, including:  Imaging tests, such as a CT scan or MRI.  Endoscopy.  Colonoscopy. How is this treated? Treatment for this condition depends on the cause. If you continue to lose a lot of blood, you may  need to be treated at a hospital. Treatment may include:  Taking supplements of iron, vitamin S31, or folic acid.  Taking a hormone medicine (erythropoietin) that can help to stimulate red blood cell growth.  Having a blood transfusion. This may be needed if you lose a lot of blood.  Making changes to your diet.  Having surgery to remove your spleen. Follow these instructions at home:  Take over-the-counter and prescription medicines only as told by your health care provider.  Take supplements only as told by your health care provider.  Follow any diet instructions that you were given.  Keep all follow-up visits as told by your health care provider. This is important. Contact a health care provider if:  You develop new bleeding anywhere in the body. Get help right away if:  You are very weak.  You are short of breath.  You have pain in your abdomen or chest.  You are dizzy or feel faint.  You have trouble concentrating.  You have bloody or black, tarry stools.  You vomit repeatedly or you vomit up blood. Summary  Anemia is a condition in which you do not have enough red blood cells or enough of a substance in your red blood cells that carries oxygen (hemoglobin).  Symptoms may occur suddenly or develop slowly.  If your anemia is mild, you may not have symptoms.  This condition is diagnosed with blood tests as well as a medical history and physical exam. Other tests may be needed.  Treatment for this condition depends on the cause of the anemia. This information is not intended to replace advice given to you by  your health care provider. Make sure you discuss any questions you have with your health care provider. Document Released: 03/17/2004 Document Revised: 01/20/2017 Document Reviewed: 03/11/2016 Elsevier Patient Education  2020 [place].  Blood Transfusion, Adult, Care After This sheet gives you information about how to care for yourself after your  procedure. Your health care provider may also give you more specific instructions. If you have problems or questions, contact your health care provider. What can I expect after the procedure? After your procedure, it is common to have:  Bruising and soreness where the IV tube was inserted.  Headache. Follow these instructions at home:   Take over-the-counter and prescription medicines only as told by your health care provider.  Return to your normal activities as told by your health care provider.  Follow instructions from your health care provider about how to take care of your IV insertion site. Make sure you: ? Wash your hands with soap and water before you change your bandage (dressing). If soap and water are not available, use hand sanitizer. ? Change your dressing as told by your health care provider.  Check your IV insertion site every day for signs of infection. Check for: ? More redness, swelling, or pain. ? More fluid or blood. ? Warmth. ? Pus or a bad smell. Contact a health care provider if:  You have more redness, swelling, or pain around the IV insertion site.  You have more fluid or blood coming from the IV insertion site.  Your IV insertion site feels warm to the touch.  You have pus or a bad smell coming from the IV insertion site.  Your urine turns pink, red, or brown.  You feel weak after doing your normal activities. Get help right away if:  You have signs of a serious allergic or immune system reaction, including: ? Itchiness. ? Hives. ? Trouble breathing. ? Anxiety. ? Chest or lower back pain. ? Fever, flushing, and chills. ? Rapid pulse. ? Rash. ? Diarrhea. ? Vomiting. ? Dark urine. ? Serious headache. ? Dizziness. ? Stiff neck. ? Yellow coloration of the face or the white parts of the eyes (jaundice). This information is not intended to replace advice given to you by your health care provider. Make sure you discuss any questions you have  with your health care provider. Document Released: 02/28/2014 Document Revised: 12/05/2016 Document Reviewed: 08/24/2015 Elsevier Patient Education  2020 [employer].

## 2018-11-30 ENCOUNTER — Other Ambulatory Visit: Payer: Self-pay | Admitting: Hematology & Oncology

## 2018-11-30 LAB — TYPE AND SCREEN
ABO/RH(D): A POS
Antibody Screen: POSITIVE
DAT, IgG: POSITIVE
Unit division: 0
Unit division: 0

## 2018-11-30 LAB — BPAM RBC
Blood Product Expiration Date: 202010282359
Blood Product Expiration Date: 202010282359
ISSUE DATE / TIME: 202010080742
ISSUE DATE / TIME: 202010080742
Unit Type and Rh: 6200
Unit Type and Rh: 6200

## 2018-12-04 ENCOUNTER — Encounter: Payer: Self-pay | Admitting: *Deleted

## 2018-12-18 ENCOUNTER — Other Ambulatory Visit: Payer: Self-pay | Admitting: Family

## 2018-12-19 ENCOUNTER — Other Ambulatory Visit: Payer: Self-pay

## 2018-12-19 ENCOUNTER — Inpatient Hospital Stay: Payer: Medicare Other

## 2018-12-19 ENCOUNTER — Inpatient Hospital Stay (HOSPITAL_BASED_OUTPATIENT_CLINIC_OR_DEPARTMENT_OTHER): Payer: Medicare Other | Admitting: Family

## 2018-12-19 VITALS — BP 145/59 | HR 77 | Temp 97.1°F | Resp 16

## 2018-12-19 VITALS — BP 129/74 | HR 81 | Temp 97.3°F | Resp 16 | Ht 62.0 in | Wt 168.0 lb

## 2018-12-19 DIAGNOSIS — D631 Anemia in chronic kidney disease: Secondary | ICD-10-CM

## 2018-12-19 DIAGNOSIS — C8307 Small cell B-cell lymphoma, spleen: Secondary | ICD-10-CM

## 2018-12-19 DIAGNOSIS — Z5112 Encounter for antineoplastic immunotherapy: Secondary | ICD-10-CM | POA: Diagnosis not present

## 2018-12-19 DIAGNOSIS — D500 Iron deficiency anemia secondary to blood loss (chronic): Secondary | ICD-10-CM

## 2018-12-19 DIAGNOSIS — D599 Acquired hemolytic anemia, unspecified: Secondary | ICD-10-CM

## 2018-12-19 DIAGNOSIS — D589 Hereditary hemolytic anemia, unspecified: Secondary | ICD-10-CM

## 2018-12-19 DIAGNOSIS — C8583 Other specified types of non-Hodgkin lymphoma, intra-abdominal lymph nodes: Secondary | ICD-10-CM | POA: Diagnosis not present

## 2018-12-19 LAB — CBC WITH DIFFERENTIAL (CANCER CENTER ONLY)
Abs Immature Granulocytes: 0.04 10*3/uL (ref 0.00–0.07)
Basophils Absolute: 0 10*3/uL (ref 0.0–0.1)
Basophils Relative: 0 %
Eosinophils Absolute: 0 10*3/uL (ref 0.0–0.5)
Eosinophils Relative: 1 %
HCT: 27 % — ABNORMAL LOW (ref 36.0–46.0)
Hemoglobin: 8 g/dL — ABNORMAL LOW (ref 12.0–15.0)
Immature Granulocytes: 1 %
Lymphocytes Relative: 12 %
Lymphs Abs: 0.3 10*3/uL — ABNORMAL LOW (ref 0.7–4.0)
MCH: 29.3 pg (ref 26.0–34.0)
MCHC: 29.6 g/dL — ABNORMAL LOW (ref 30.0–36.0)
MCV: 98.9 fL (ref 80.0–100.0)
Monocytes Absolute: 0.2 10*3/uL (ref 0.1–1.0)
Monocytes Relative: 6 %
Neutro Abs: 2.2 10*3/uL (ref 1.7–7.7)
Neutrophils Relative %: 80 %
Platelet Count: 153 10*3/uL (ref 150–400)
RBC: 2.73 MIL/uL — ABNORMAL LOW (ref 3.87–5.11)
RDW: 21.2 % — ABNORMAL HIGH (ref 11.5–15.5)
WBC Count: 3 10*3/uL — ABNORMAL LOW (ref 4.0–10.5)
nRBC: 0 % (ref 0.0–0.2)

## 2018-12-19 LAB — FERRITIN: Ferritin: 2229 ng/mL — ABNORMAL HIGH (ref 11–307)

## 2018-12-19 LAB — CMP (CANCER CENTER ONLY)
ALT: 11 U/L (ref 0–44)
AST: 21 U/L (ref 15–41)
Albumin: 4.2 g/dL (ref 3.5–5.0)
Alkaline Phosphatase: 101 U/L (ref 38–126)
Anion gap: 9 (ref 5–15)
BUN: 10 mg/dL (ref 8–23)
CO2: 26 mmol/L (ref 22–32)
Calcium: 8.9 mg/dL (ref 8.9–10.3)
Chloride: 106 mmol/L (ref 98–111)
Creatinine: 0.64 mg/dL (ref 0.44–1.00)
GFR, Est AFR Am: >60 mL/min (ref >60)
GFR, Estimated: >60 mL/min (ref >60)
Glucose, Bld: 138 mg/dL — ABNORMAL HIGH (ref 70–99)
Potassium: 3.7 mmol/L (ref 3.5–5.1)
Sodium: 141 mmol/L (ref 135–145)
Total Bilirubin: 2.5 mg/dL — ABNORMAL HIGH (ref 0.3–1.2)
Total Protein: 6 g/dL — ABNORMAL LOW (ref 6.5–8.1)

## 2018-12-19 LAB — RETIC PANEL
Immature Retic Fract: 45.1 % — ABNORMAL HIGH (ref 2.3–15.9)
RBC.: 2.69 MIL/uL — ABNORMAL LOW (ref 3.87–5.11)
Retic Count, Absolute: 302.9 10*3/uL — ABNORMAL HIGH (ref 19.0–186.0)
Retic Ct Pct: 11.3 % — ABNORMAL HIGH (ref 0.4–3.1)
Reticulocyte Hemoglobin: 30.9 pg (ref >27.9)

## 2018-12-19 LAB — IRON AND TIBC
Iron: 79 ug/dL (ref 41–142)
Saturation Ratios: 26 % (ref 21–57)
TIBC: 305 ug/dL (ref 236–444)
UIBC: 226 ug/dL (ref 120–384)

## 2018-12-19 LAB — LACTATE DEHYDROGENASE: LDH: 346 U/L — ABNORMAL HIGH (ref 98–192)

## 2018-12-19 MED ORDER — FOLIC ACID 1 MG PO TABS: 1.0000 mg | ORAL_TABLET | Freq: Every day | ORAL | 11 refills | Status: DC

## 2018-12-19 MED ORDER — DIPHENHYDRAMINE HCL 25 MG PO CAPS
50.0000 mg | ORAL_CAPSULE | Freq: Once | ORAL | Status: AC
  Administered 2018-12-19: 50 mg via ORAL

## 2018-12-19 MED ORDER — ALTEPLASE 2 MG IJ SOLR
2.0000 mg | Freq: Once | INTRAMUSCULAR | Status: DC | PRN
  Filled 2018-12-19: qty 2

## 2018-12-19 MED ORDER — ACETAMINOPHEN 325 MG PO TABS
ORAL_TABLET | ORAL | Status: AC
  Filled 2018-12-19: qty 2

## 2018-12-19 MED ORDER — DIPHENHYDRAMINE HCL 25 MG PO CAPS
ORAL_CAPSULE | ORAL | Status: AC
  Filled 2018-12-19: qty 2

## 2018-12-19 MED ORDER — SODIUM CHLORIDE 0.9 % IV SOLN
Freq: Once | INTRAVENOUS | Status: AC
  Administered 2018-12-19: 10:00:00 via INTRAVENOUS
  Filled 2018-12-19: qty 250

## 2018-12-19 MED ORDER — ACETAMINOPHEN 325 MG PO TABS
650.0000 mg | ORAL_TABLET | Freq: Once | ORAL | Status: AC
  Administered 2018-12-19: 650 mg via ORAL

## 2018-12-19 MED ORDER — HEPARIN SOD (PORK) LOCK FLUSH 100 UNIT/ML IV SOLN
500.0000 [IU] | Freq: Once | INTRAVENOUS | Status: AC | PRN
  Administered 2018-12-19: 500 [IU]
  Filled 2018-12-19: qty 5

## 2018-12-19 MED ORDER — SODIUM CHLORIDE 0.9 % IV SOLN
375.0000 mg/m2 | Freq: Once | INTRAVENOUS | Status: AC
  Administered 2018-12-19: 700 mg via INTRAVENOUS
  Filled 2018-12-19: qty 50

## 2018-12-19 MED ORDER — SODIUM CHLORIDE 0.9% FLUSH
10.0000 mL | INTRAVENOUS | Status: DC | PRN
  Administered 2018-12-19: 10 mL
  Filled 2018-12-19: qty 10

## 2018-12-19 MED ORDER — SODIUM CHLORIDE 0.9 % IV SOLN
200.0000 mg | Freq: Once | INTRAVENOUS | Status: AC
  Administered 2018-12-19: 200 mg via INTRAVENOUS
  Filled 2018-12-19: qty 10

## 2018-12-19 NOTE — Progress Notes (Signed)
Hematology and Oncology Follow Up Visit  Kristine Sullivan [id number] 1931-08-11 85 y.o. 12/19/2018   Principle Diagnosis:  Marginal zone lymphoma -- recurrent Iron deficiency anemia  Past therapy: CVP -- completed 8 cycles on 04/09/2012      Maintenance Rituxan - 2 years completed in June 2016 IV iron - last dose on 06/2018  Current Therapy:   Rituxan/Cytoxan -- started on 11/08/2018, s/p cycle 2, Cytoxan d/c'd 12/19/2018 IV Iron as needed    Interim History:  Kristine Sullivan is here today for follow-up. She states that she is feeling better since receiving 2 units of blood earlier this month (11/28/2018). Hgb is still low at 8.0, MCV 98.9. Retic count percentage is up to 11.3%. WBC count is 3.0 and platelet count 153.  She still has some fatigue and SOB with over exertion.  She denies having any episodes of bleeding. No bruising or petechiae.  No fever, chills, n/v, cough, rash, dizziness, chest pain, palpitations, abdominal pain or changes in bowel or bladder habits.  She has occasional numbness and tingling in her fingertips and toes.  No swelling or tenderness in her extremities at this time.  No falls or syncopal episodes to report.  Her appetite comes and goes and she admits that she needs to better hydrate throughout the day. Her weight is up 2 lbs since her last visit.   ECOG Performance Status: 1 - Symptomatic but completely ambulatory  Medications:  Allergies as of 12/19/2018      Reactions   Lisinopril Swelling   Facial swelling  Facial swelling    Timolol Other (See Comments), Swelling   Pregabalin Other (See Comments)   constipation constipation constipation      Medication List       Accurate as of December 19, 2018  9:27 AM. If you have any questions, ask your nurse or doctor.        alendronate 70 MG tablet Commonly known as: FOSAMAX Take by mouth once a week.   amLODipine 5 MG tablet Commonly known as: NORVASC Take 5 mg by mouth daily.   apixaban 5  MG Tabs tablet Commonly known as: ELIQUIS Take 5 mg by mouth 2 (two) times daily.   aspirin 81 MG tablet Take 81 mg by mouth daily.   atorvastatin 80 MG tablet Commonly known as: LIPITOR Take 40 mg by mouth daily.   Calcium 600-D 600-400 MG-UNIT Tabs Generic drug: Calcium Carbonate-Vitamin D3 Take by mouth.   diphenhydrAMINE 25 MG tablet Commonly known as: BENADRYL Take 25 mg by mouth at bedtime as needed.   dorzolamide 2 % ophthalmic solution Commonly known as: TRUSOPT Place 1 drop into both eyes 3 (three) times daily.   ferrous sulfate 325 (65 FE) MG tablet Take 325 mg by mouth daily.   furosemide 40 MG tablet Commonly known as: LASIX Take 40 mg by mouth daily.   gabapentin 300 MG capsule Commonly known as: NEURONTIN Take 1 capsule (300 mg total) by mouth at bedtime. 90 day supply   ipratropium-albuterol 0.5-2.5 (3) MG/3ML Soln Commonly known as: DUONEB INHALE 3 ML BY NEBULIZATION EVERY SIX (6) HOURS AS NEEDED.   latanoprost 0.005 % ophthalmic solution Commonly known as: XALATAN   lidocaine-prilocaine cream Commonly known as: EMLA Apply 1 application topically as needed.   loratadine 10 MG tablet Commonly known as: CLARITIN OTC Take by mouth one a day for 10 days, then once a day as needed -allergies.   metFORMIN 500 MG tablet Commonly known as: GLUCOPHAGE Take 500  mg by mouth daily with breakfast.   ondansetron 8 MG tablet Commonly known as: ZOFRAN Take 1 tablet (8 mg total) by mouth every 8 (eight) hours as needed for nausea or vomiting.   potassium chloride 10 MEQ tablet Commonly known as: KLOR-CON Take 10 mEq by mouth daily.   Propylene Glycol 0.6 % Soln 1 drop.   raloxifene 60 MG tablet Commonly known as: EVISTA Take 60 mg by mouth daily.   ranitidine 150 MG tablet Commonly known as: ZANTAC 1 tablet by mouth twice a day for month and then take twice a day for needed - GERD   tiZANidine 4 MG capsule Commonly known as: ZANAFLEX Take 4 mg  by mouth at bedtime.   Vitamin D (Ergocalciferol) 1.25 MG (50000 UT) Caps capsule Commonly known as: DRISDOL TAKE ONE CAPSULE BY MOUTH 3 DAYS WEEK WITH A MEAL FOR LOW VITAMIN D       Allergies:  Allergies  Allergen Reactions  . Lisinopril Swelling    Facial swelling  Facial swelling    . Timolol Other (See Comments) and Swelling  . Pregabalin Other (See Comments)    constipation constipation  constipation    Past Medical History, Surgical history, Social history, and Family History were reviewed and updated.  Review of Systems: All other 10 point review of systems is negative.   Physical Exam:  height is 5\' 2"  (1.575 m) and weight is 168 lb (76.2 kg). Her temporal temperature is 97.3 F (36.3 C) (abnormal). Her blood pressure is 129/74 and her pulse is 81. Her respiration is 16 and oxygen saturation is 94%.   Wt Readings from Last 3 Encounters:  12/19/18 168 lb (76.2 kg)  11/28/18 166 lb 12.8 oz (75.7 kg)  11/02/18 171 lb 12 oz (77.9 kg)    Ocular: Sclerae unicteric, pupils equal, round and reactive to light Ear-nose-throat: Oropharynx clear, dentition fair Lymphatic: No cervical or supraclavicular adenopathy Lungs no rales or rhonchi, good excursion bilaterally Heart regular rate and rhythm, no murmur appreciated Abd soft, nontender, positive bowel sounds, no liver or spleen tip palpated on exam, no fluid wave  MSK no focal spinal tenderness, no joint edema Neuro: non-focal, well-oriented, appropriate affect Breasts: Deferred   Lab Results  Component Value Date   WBC 3.0 (L) 12/19/2018   HGB 8.0 (L) 12/19/2018   HCT 27.0 (L) 12/19/2018   MCV 98.9 12/19/2018   PLT 153 12/19/2018   Lab Results  Component Value Date   FERRITIN 310 (H) 11/08/2018   IRON 61 11/08/2018   TIBC 335 11/08/2018   UIBC 274 11/08/2018   IRONPCTSAT 18 (L) 11/08/2018   Lab Results  Component Value Date   RETICCTPCT 11.3 (H) 12/19/2018   RBC 2.73 (L) 12/19/2018   RBC 2.69 (L)  12/19/2018   RETICCTABS 59.2 11/13/2013   No results found for: KPAFRELGTCHN, LAMBDASER, KAPLAMBRATIO Lab Results  Component Value Date   IGGSERUM 557 (L) 06/19/2013   IGA 100 06/19/2013   IGMSERUM 95 06/19/2013   Lab Results  Component Value Date   TOTALPROTELP 6.3 09/16/2011   ALBUMINELP 59.9 09/16/2011   A1GS 7.3 (H) 09/16/2011   A2GS 9.9 09/16/2011   BETS 8.4 (H) 09/16/2011   BETA2SER 4.8 09/16/2011   GAMS 9.7 (L) 09/16/2011   MSPIKE NOT DET 09/16/2011   SPEI * 09/16/2011     Chemistry      Component Value Date/Time   NA 141 12/19/2018 0855   NA 140 09/16/2016 0945   K 3.7 12/19/2018  0855   K 3.2 (L) 09/16/2016 0945   CL 106 12/19/2018 0855   CL 101 12/08/2014 1138   CL 99 05/28/2012 1036   CO2 26 12/19/2018 0855   CO2 26 09/16/2016 0945   BUN 10 12/19/2018 0855   BUN 9.0 09/16/2016 0945   CREATININE 0.64 12/19/2018 0855   CREATININE 0.7 09/16/2016 0945      Component Value Date/Time   CALCIUM 8.9 12/19/2018 0855   CALCIUM 9.0 09/16/2016 0945   ALKPHOS 101 12/19/2018 0855   ALKPHOS 122 09/16/2016 0945   AST 21 12/19/2018 0855   AST 25 09/16/2016 0945   ALT 11 12/19/2018 0855   ALT 15 09/16/2016 0945   BILITOT 2.5 (H) 12/19/2018 0855   BILITOT 0.90 09/16/2016 0945       Impression and Plan: Ms. Smyers is a very pleasant 85 yo African American female with a history of marginal zone lymphoma, angioedema and autoimmune hemolytic anemia.   She will get Rituxan only today. Cytoxan removed from regimen due to anemia, per Dr. Marin Olp.  She will also receive dose 3/5 of Venofer today. She will start folic acid 1 mg PO daily.  We will see what her erythropoietin level looks like. She may benefit from replacement injection with Retacrit or Aranesp.  We will plan to see her back in another 3 weeks.  She will contact our office with any questions or concerns. We can certainly see her sooner if needed.   Laverna Peace, NP 10/28/20209:27 AM

## 2018-12-19 NOTE — Patient Instructions (Addendum)
[facility] Discharge Instructions for Patients Receiving Chemotherapy  Today you received the following chemotherapy agents Rituxan  To help prevent nausea and vomiting after your treatment, we encourage you to take your nausea medication as prescribed.  If you develop nausea and vomiting that is not controlled by your nausea medication, call the clinic.   BELOW ARE SYMPTOMS THAT SHOULD BE REPORTED IMMEDIATELY:  *FEVER GREATER THAN 100.5 F  *CHILLS WITH OR WITHOUT FEVER  NAUSEA AND VOMITING THAT IS NOT CONTROLLED WITH YOUR NAUSEA MEDICATION  *UNUSUAL SHORTNESS OF BREATH  *UNUSUAL BRUISING OR BLEEDING  TENDERNESS IN MOUTH AND THROAT WITH OR WITHOUT PRESENCE OF ULCERS  *URINARY PROBLEMS  *BOWEL PROBLEMS  UNUSUAL RASH Items with * indicate a potential emergency and should be followed up as soon as possible.  Feel free to call the clinic should you have any questions or concerns. The clinic phone number is [phone].  Please show the [place] at check-in to the Emergency Department and triage nurse.  Iron Sucrose injection What is this medicine? IRON SUCROSE (AHY ern SOO krohs) is an iron complex. Iron is used to make healthy red blood cells, which carry oxygen and nutrients throughout the body. This medicine is used to treat iron deficiency anemia in people with chronic kidney disease. This medicine may be used for other purposes; ask your health care provider or pharmacist if you have questions. COMMON BRAND NAME(S): Venofer What should I tell my health care provider before I take this medicine? They need to know if you have any of these conditions:  anemia not caused by low iron levels  heart disease  high levels of iron in the blood  kidney disease  liver disease  an unusual or allergic reaction to iron, other medicines, foods, dyes, or preservatives  pregnant or trying to get pregnant  breast-feeding How should I use this  medicine? This medicine is for infusion into a vein. It is given by a health care professional in a hospital or clinic setting. Talk to your pediatrician regarding the use of this medicine in children. While this drug may be prescribed for children as young as 2 years for selected conditions, precautions do apply. Overdosage: If you think you have taken too much of this medicine contact a poison control center or emergency room at once. NOTE: This medicine is only for you. Do not share this medicine with others. What if I miss a dose? It is important not to miss your dose. Call your doctor or health care professional if you are unable to keep an appointment. What may interact with this medicine? Do not take this medicine with any of the following medications:  deferoxamine  dimercaprol  other iron products This medicine may also interact with the following medications:  chloramphenicol  deferasirox This list may not describe all possible interactions. Give your health care provider a list of all the medicines, herbs, non-prescription drugs, or dietary supplements you use. Also tell them if you smoke, drink alcohol, or use illegal drugs. Some items may interact with your medicine. What should I watch for while using this medicine? Visit your doctor or healthcare professional regularly. Tell your doctor or healthcare professional if your symptoms do not start to get better or if they get worse. You may need blood work done while you are taking this medicine. You may need to follow a special diet. Talk to your doctor. Foods that contain iron include: whole grains/cereals, dried fruits, beans, or  peas, leafy green vegetables, and organ meats (liver, kidney). What side effects may I notice from receiving this medicine? Side effects that you should report to your doctor or health care professional as soon as possible:  allergic reactions like skin rash, itching or hives, swelling of the face,  lips, or tongue  breathing problems  changes in blood pressure  cough  fast, irregular heartbeat  feeling faint or lightheaded, falls  fever or chills  flushing, sweating, or hot feelings  joint or muscle aches/pains  seizures  swelling of the ankles or feet  unusually weak or tired Side effects that usually do not require medical attention (report to your doctor or health care professional if they continue or are bothersome):  diarrhea  feeling achy  headache  irritation at site where injected  nausea, vomiting  stomach upset  tiredness This list may not describe all possible side effects. Call your doctor for medical advice about side effects. You may report side effects to FDA at 1-800-FDA-1088. Where should I keep my medicine? This drug is given in a hospital or clinic and will not be stored at home. NOTE: This sheet is a summary. It may not cover all possible information. If you have questions about this medicine, talk to your doctor, pharmacist, or health care provider.  2020 Elsevier/Gold Standard (2010-11-18 17:14:35)

## 2018-12-20 LAB — ERYTHROPOIETIN: Erythropoietin: 180.4 m[IU]/mL — ABNORMAL HIGH (ref 2.6–18.5)

## 2018-12-25 ENCOUNTER — Other Ambulatory Visit: Payer: Self-pay | Admitting: Hematology & Oncology

## 2018-12-26 ENCOUNTER — Inpatient Hospital Stay: Payer: Medicare Other | Attending: Hematology & Oncology

## 2018-12-26 ENCOUNTER — Inpatient Hospital Stay: Payer: Medicare Other

## 2018-12-26 ENCOUNTER — Other Ambulatory Visit: Payer: Self-pay | Admitting: Family

## 2018-12-26 ENCOUNTER — Other Ambulatory Visit: Payer: Self-pay | Admitting: *Deleted

## 2018-12-26 ENCOUNTER — Other Ambulatory Visit: Payer: Self-pay

## 2018-12-26 VITALS — BP 135/56 | HR 77 | Temp 97.0°F | Resp 20

## 2018-12-26 DIAGNOSIS — Z5112 Encounter for antineoplastic immunotherapy: Secondary | ICD-10-CM | POA: Diagnosis present

## 2018-12-26 DIAGNOSIS — Z7984 Long term (current) use of oral hypoglycemic drugs: Secondary | ICD-10-CM | POA: Diagnosis not present

## 2018-12-26 DIAGNOSIS — D589 Hereditary hemolytic anemia, unspecified: Secondary | ICD-10-CM

## 2018-12-26 DIAGNOSIS — E119 Type 2 diabetes mellitus without complications: Secondary | ICD-10-CM | POA: Insufficient documentation

## 2018-12-26 DIAGNOSIS — E785 Hyperlipidemia, unspecified: Secondary | ICD-10-CM | POA: Diagnosis not present

## 2018-12-26 DIAGNOSIS — R200 Anesthesia of skin: Secondary | ICD-10-CM | POA: Insufficient documentation

## 2018-12-26 DIAGNOSIS — D500 Iron deficiency anemia secondary to blood loss (chronic): Secondary | ICD-10-CM

## 2018-12-26 DIAGNOSIS — R0602 Shortness of breath: Secondary | ICD-10-CM | POA: Diagnosis not present

## 2018-12-26 DIAGNOSIS — C8307 Small cell B-cell lymphoma, spleen: Secondary | ICD-10-CM | POA: Insufficient documentation

## 2018-12-26 DIAGNOSIS — Z79899 Other long term (current) drug therapy: Secondary | ICD-10-CM | POA: Insufficient documentation

## 2018-12-26 DIAGNOSIS — R202 Paresthesia of skin: Secondary | ICD-10-CM | POA: Diagnosis not present

## 2018-12-26 DIAGNOSIS — D649 Anemia, unspecified: Secondary | ICD-10-CM

## 2018-12-26 DIAGNOSIS — Z7982 Long term (current) use of aspirin: Secondary | ICD-10-CM | POA: Diagnosis not present

## 2018-12-26 DIAGNOSIS — Z7901 Long term (current) use of anticoagulants: Secondary | ICD-10-CM | POA: Insufficient documentation

## 2018-12-26 DIAGNOSIS — I10 Essential (primary) hypertension: Secondary | ICD-10-CM | POA: Insufficient documentation

## 2018-12-26 LAB — SAMPLE TO BLOOD BANK

## 2018-12-26 LAB — CBC WITH DIFFERENTIAL (CANCER CENTER ONLY)
Abs Immature Granulocytes: 0.06 10*3/uL (ref 0.00–0.07)
Basophils Absolute: 0 10*3/uL (ref 0.0–0.1)
Basophils Relative: 0 %
Eosinophils Absolute: 0.1 10*3/uL (ref 0.0–0.5)
Eosinophils Relative: 2 %
HCT: 24.6 % — ABNORMAL LOW (ref 36.0–46.0)
Hemoglobin: 7.1 g/dL — ABNORMAL LOW (ref 12.0–15.0)
Immature Granulocytes: 2 %
Lymphocytes Relative: 10 %
Lymphs Abs: 0.3 10*3/uL — ABNORMAL LOW (ref 0.7–4.0)
MCH: 30.1 pg (ref 26.0–34.0)
MCHC: 28.9 g/dL — ABNORMAL LOW (ref 30.0–36.0)
MCV: 104.2 fL — ABNORMAL HIGH (ref 80.0–100.0)
Monocytes Absolute: 0.2 10*3/uL (ref 0.1–1.0)
Monocytes Relative: 6 %
Neutro Abs: 2.3 10*3/uL (ref 1.7–7.7)
Neutrophils Relative %: 80 %
Platelet Count: 113 10*3/uL — ABNORMAL LOW (ref 150–400)
RBC: 2.36 MIL/uL — ABNORMAL LOW (ref 3.87–5.11)
RDW: 22.9 % — ABNORMAL HIGH (ref 11.5–15.5)
WBC Count: 2.9 10*3/uL — ABNORMAL LOW (ref 4.0–10.5)
nRBC: 0 % (ref 0.0–0.2)

## 2018-12-26 LAB — PREPARE RBC (CROSSMATCH)

## 2018-12-26 MED ORDER — SODIUM CHLORIDE 0.9 % IV SOLN
INTRAVENOUS | Status: DC
  Administered 2018-12-26: 11:00:00 via INTRAVENOUS
  Filled 2018-12-26: qty 250

## 2018-12-26 MED ORDER — SODIUM CHLORIDE 0.9% FLUSH
10.0000 mL | INTRAVENOUS | Status: DC | PRN
  Administered 2018-12-26: 10 mL via INTRAVENOUS
  Filled 2018-12-26: qty 10

## 2018-12-26 MED ORDER — HEPARIN SOD (PORK) LOCK FLUSH 100 UNIT/ML IV SOLN
500.0000 [IU] | Freq: Once | INTRAVENOUS | Status: AC | PRN
  Administered 2018-12-26: 500 [IU] via INTRAVENOUS
  Filled 2018-12-26: qty 5

## 2018-12-26 MED ORDER — SODIUM CHLORIDE 0.9 % IV SOLN
200.0000 mg | Freq: Once | INTRAVENOUS | Status: AC
  Administered 2018-12-26: 200 mg via INTRAVENOUS
  Filled 2018-12-26: qty 10

## 2018-12-26 NOTE — Progress Notes (Signed)
Kristine Sullivan is here today for her fourth Venofer infusion and she is feeling more fatigue and increased SOB with any exertion. She is not in distress at this time.  We repeat a CBC today and her Hgb is now 7.1 (down from 8 last week).  We will get a stool sample to check for blood. If positive will get her GI.  We will transfuse 2 units of blood tomorrow. She is in agreement with the plan.

## 2018-12-27 ENCOUNTER — Inpatient Hospital Stay: Payer: Medicare Other

## 2018-12-27 VITALS — BP 148/70 | HR 69 | Temp 97.1°F | Resp 17

## 2018-12-27 DIAGNOSIS — D589 Hereditary hemolytic anemia, unspecified: Secondary | ICD-10-CM

## 2018-12-27 DIAGNOSIS — D649 Anemia, unspecified: Secondary | ICD-10-CM

## 2018-12-27 DIAGNOSIS — D500 Iron deficiency anemia secondary to blood loss (chronic): Secondary | ICD-10-CM

## 2018-12-27 DIAGNOSIS — Z5112 Encounter for antineoplastic immunotherapy: Secondary | ICD-10-CM | POA: Diagnosis not present

## 2018-12-27 MED ORDER — ACETAMINOPHEN 325 MG PO TABS
650.0000 mg | ORAL_TABLET | Freq: Once | ORAL | Status: AC
  Administered 2018-12-27: 09:00:00 650 mg via ORAL

## 2018-12-27 MED ORDER — DIPHENHYDRAMINE HCL 25 MG PO CAPS
ORAL_CAPSULE | ORAL | Status: AC
  Filled 2018-12-27: qty 1

## 2018-12-27 MED ORDER — HEPARIN SOD (PORK) LOCK FLUSH 100 UNIT/ML IV SOLN
500.0000 [IU] | Freq: Once | INTRAVENOUS | Status: AC | PRN
  Administered 2018-12-27: 500 [IU] via INTRAVENOUS
  Filled 2018-12-27: qty 5

## 2018-12-27 MED ORDER — SODIUM CHLORIDE 0.9% IV SOLUTION
250.0000 mL | Freq: Once | INTRAVENOUS | Status: AC
  Administered 2018-12-27: 250 mL via INTRAVENOUS
  Filled 2018-12-27: qty 250

## 2018-12-27 MED ORDER — DIPHENHYDRAMINE HCL 25 MG PO CAPS
25.0000 mg | ORAL_CAPSULE | Freq: Once | ORAL | Status: AC
  Administered 2018-12-27: 25 mg via ORAL

## 2018-12-27 MED ORDER — SODIUM CHLORIDE 0.9% FLUSH
10.0000 mL | INTRAVENOUS | Status: DC | PRN
  Administered 2018-12-27: 15:00:00 10 mL via INTRAVENOUS
  Filled 2018-12-27: qty 10

## 2018-12-27 MED ORDER — ACETAMINOPHEN 325 MG PO TABS
ORAL_TABLET | ORAL | Status: AC
  Filled 2018-12-27: qty 2

## 2018-12-28 LAB — TYPE AND SCREEN
ABO/RH(D): A POS
Antibody Screen: POSITIVE
DAT, IgG: POSITIVE
Unit division: 0
Unit division: 0

## 2018-12-28 LAB — BPAM RBC
Blood Product Expiration Date: 202012012359
Blood Product Expiration Date: 202012012359
ISSUE DATE / TIME: 202011050752
ISSUE DATE / TIME: 202011050752
Unit Type and Rh: 6200
Unit Type and Rh: 6200

## 2019-01-02 ENCOUNTER — Other Ambulatory Visit: Payer: Self-pay

## 2019-01-02 ENCOUNTER — Inpatient Hospital Stay: Payer: Medicare Other

## 2019-01-02 VITALS — BP 136/61 | HR 72 | Temp 97.1°F | Resp 16

## 2019-01-02 DIAGNOSIS — D589 Hereditary hemolytic anemia, unspecified: Secondary | ICD-10-CM

## 2019-01-02 DIAGNOSIS — D500 Iron deficiency anemia secondary to blood loss (chronic): Secondary | ICD-10-CM

## 2019-01-02 DIAGNOSIS — Z5112 Encounter for antineoplastic immunotherapy: Secondary | ICD-10-CM | POA: Diagnosis not present

## 2019-01-02 MED ORDER — SODIUM CHLORIDE 0.9 % IV SOLN
200.0000 mg | Freq: Once | INTRAVENOUS | Status: DC
  Filled 2019-01-02: qty 10

## 2019-01-02 MED ORDER — SODIUM CHLORIDE 0.9 % IV SOLN
200.0000 mg | Freq: Once | INTRAVENOUS | Status: AC
  Administered 2019-01-02: 200 mg via INTRAVENOUS
  Filled 2019-01-02: qty 10

## 2019-01-02 MED ORDER — HEPARIN SOD (PORK) LOCK FLUSH 100 UNIT/ML IV SOLN
500.0000 [IU] | Freq: Once | INTRAVENOUS | Status: AC | PRN
  Administered 2019-01-02: 12:00:00 500 [IU] via INTRAVENOUS
  Filled 2019-01-02: qty 5

## 2019-01-02 MED ORDER — SODIUM CHLORIDE 0.9 % IV SOLN
INTRAVENOUS | Status: DC
  Administered 2019-01-02: 11:00:00 via INTRAVENOUS
  Filled 2019-01-02: qty 250

## 2019-01-02 MED ORDER — SODIUM CHLORIDE 0.9% FLUSH
10.0000 mL | INTRAVENOUS | Status: DC | PRN
  Administered 2019-01-02: 10 mL via INTRAVENOUS
  Filled 2019-01-02: qty 10

## 2019-01-02 NOTE — Patient Instructions (Signed)

## 2019-01-03 ENCOUNTER — Other Ambulatory Visit: Payer: Self-pay | Admitting: Hematology & Oncology

## 2019-01-09 ENCOUNTER — Inpatient Hospital Stay: Payer: Medicare Other

## 2019-01-09 ENCOUNTER — Other Ambulatory Visit: Payer: Self-pay

## 2019-01-09 ENCOUNTER — Inpatient Hospital Stay (HOSPITAL_BASED_OUTPATIENT_CLINIC_OR_DEPARTMENT_OTHER): Payer: Medicare Other | Admitting: Family

## 2019-01-09 ENCOUNTER — Encounter: Payer: Self-pay | Admitting: Hematology & Oncology

## 2019-01-09 VITALS — BP 144/71 | HR 85 | Temp 97.1°F | Resp 16 | Wt 165.0 lb

## 2019-01-09 VITALS — BP 134/69 | HR 69 | Temp 97.8°F | Resp 17

## 2019-01-09 DIAGNOSIS — C8307 Small cell B-cell lymphoma, spleen: Secondary | ICD-10-CM

## 2019-01-09 DIAGNOSIS — D631 Anemia in chronic kidney disease: Secondary | ICD-10-CM

## 2019-01-09 DIAGNOSIS — C8583 Other specified types of non-Hodgkin lymphoma, intra-abdominal lymph nodes: Secondary | ICD-10-CM | POA: Diagnosis not present

## 2019-01-09 DIAGNOSIS — Z5112 Encounter for antineoplastic immunotherapy: Secondary | ICD-10-CM | POA: Diagnosis not present

## 2019-01-09 DIAGNOSIS — D599 Acquired hemolytic anemia, unspecified: Secondary | ICD-10-CM

## 2019-01-09 DIAGNOSIS — D500 Iron deficiency anemia secondary to blood loss (chronic): Secondary | ICD-10-CM

## 2019-01-09 LAB — CBC WITH DIFFERENTIAL (CANCER CENTER ONLY)
Abs Immature Granulocytes: 0.03 10*3/uL (ref 0.00–0.07)
Basophils Absolute: 0 10*3/uL (ref 0.0–0.1)
Basophils Relative: 1 %
Eosinophils Absolute: 0.1 10*3/uL (ref 0.0–0.5)
Eosinophils Relative: 2 %
HCT: 31 % — ABNORMAL LOW (ref 36.0–46.0)
Hemoglobin: 9.3 g/dL — ABNORMAL LOW (ref 12.0–15.0)
Immature Granulocytes: 1 %
Lymphocytes Relative: 7 %
Lymphs Abs: 0.3 10*3/uL — ABNORMAL LOW (ref 0.7–4.0)
MCH: 30.5 pg (ref 26.0–34.0)
MCHC: 30 g/dL (ref 30.0–36.0)
MCV: 101.6 fL — ABNORMAL HIGH (ref 80.0–100.0)
Monocytes Absolute: 0.2 10*3/uL (ref 0.1–1.0)
Monocytes Relative: 6 %
Neutro Abs: 3.1 10*3/uL (ref 1.7–7.7)
Neutrophils Relative %: 83 %
Platelet Count: 145 10*3/uL — ABNORMAL LOW (ref 150–400)
RBC: 3.05 MIL/uL — ABNORMAL LOW (ref 3.87–5.11)
RDW: 18.5 % — ABNORMAL HIGH (ref 11.5–15.5)
WBC Count: 3.7 10*3/uL — ABNORMAL LOW (ref 4.0–10.5)
nRBC: 0 % (ref 0.0–0.2)

## 2019-01-09 LAB — CMP (CANCER CENTER ONLY)
ALT: 11 U/L (ref 0–44)
AST: 21 U/L (ref 15–41)
Albumin: 4.3 g/dL (ref 3.5–5.0)
Alkaline Phosphatase: 105 U/L (ref 38–126)
Anion gap: 7 (ref 5–15)
BUN: 12 mg/dL (ref 8–23)
CO2: 28 mmol/L (ref 22–32)
Calcium: 8.9 mg/dL (ref 8.9–10.3)
Chloride: 105 mmol/L (ref 98–111)
Creatinine: 0.61 mg/dL (ref 0.44–1.00)
GFR, Est AFR Am: >60 mL/min (ref >60)
GFR, Estimated: >60 mL/min (ref >60)
Glucose, Bld: 115 mg/dL — ABNORMAL HIGH (ref 70–99)
Potassium: 4.1 mmol/L (ref 3.5–5.1)
Sodium: 140 mmol/L (ref 135–145)
Total Bilirubin: 2.2 mg/dL — ABNORMAL HIGH (ref 0.3–1.2)
Total Protein: 5.7 g/dL — ABNORMAL LOW (ref 6.5–8.1)

## 2019-01-09 LAB — IRON AND TIBC
Iron: 86 ug/dL (ref 41–142)
Saturation Ratios: 29 % (ref 21–57)
TIBC: 299 ug/dL (ref 236–444)
UIBC: 213 ug/dL (ref 120–384)

## 2019-01-09 LAB — RETICULOCYTES
Immature Retic Fract: 29.4 % — ABNORMAL HIGH (ref 2.3–15.9)
RBC.: 3.04 MIL/uL — ABNORMAL LOW (ref 3.87–5.11)
Retic Count, Absolute: 296.1 10*3/uL — ABNORMAL HIGH (ref 19.0–186.0)
Retic Ct Pct: 9.7 % — ABNORMAL HIGH (ref 0.4–3.1)

## 2019-01-09 LAB — LACTATE DEHYDROGENASE: LDH: 334 U/L — ABNORMAL HIGH (ref 98–192)

## 2019-01-09 LAB — FERRITIN: Ferritin: 5259 ng/mL — ABNORMAL HIGH (ref 11–307)

## 2019-01-09 MED ORDER — DIPHENHYDRAMINE HCL 25 MG PO CAPS
ORAL_CAPSULE | ORAL | Status: AC
  Filled 2019-01-09: qty 2

## 2019-01-09 MED ORDER — SODIUM CHLORIDE 0.9% FLUSH
10.0000 mL | INTRAVENOUS | Status: DC | PRN
  Administered 2019-01-09: 10 mL
  Filled 2019-01-09: qty 10

## 2019-01-09 MED ORDER — SODIUM CHLORIDE 0.9 % IV SOLN
Freq: Once | INTRAVENOUS | Status: AC
  Administered 2019-01-09: 11:00:00 via INTRAVENOUS
  Filled 2019-01-09: qty 250

## 2019-01-09 MED ORDER — HEPARIN SOD (PORK) LOCK FLUSH 100 UNIT/ML IV SOLN
500.0000 [IU] | Freq: Once | INTRAVENOUS | Status: AC | PRN
  Administered 2019-01-09: 500 [IU]
  Filled 2019-01-09: qty 5

## 2019-01-09 MED ORDER — ACETAMINOPHEN 325 MG PO TABS
ORAL_TABLET | ORAL | Status: AC
  Filled 2019-01-09: qty 2

## 2019-01-09 MED ORDER — ACETAMINOPHEN 325 MG PO TABS
650.0000 mg | ORAL_TABLET | Freq: Once | ORAL | Status: AC
  Administered 2019-01-09: 11:00:00 650 mg via ORAL

## 2019-01-09 MED ORDER — SODIUM CHLORIDE 0.9 % IV SOLN
375.0000 mg/m2 | Freq: Once | INTRAVENOUS | Status: AC
  Administered 2019-01-09: 700 mg via INTRAVENOUS
  Filled 2019-01-09: qty 50

## 2019-01-09 MED ORDER — DIPHENHYDRAMINE HCL 25 MG PO CAPS
50.0000 mg | ORAL_CAPSULE | Freq: Once | ORAL | Status: AC
  Administered 2019-01-09: 50 mg via ORAL

## 2019-01-09 NOTE — Progress Notes (Signed)
Hematology and Oncology Follow Up Visit  Zahara Meeder [id number] 06/10/1931 85 y.o. 01/09/2019   Principle Diagnosis:  Marginal zone lymphoma -- recurrent Iron deficiency anemia  Past therapy: CVP -- completed 8 cycles on 04/09/2012 Maintenance Rituxan - 2 years completed in June 2016 IV iron - last dose on 06/2018  Current Therapy:   Rituxan/Cytoxan -- startedon 11/08/2018, s/p cycle 2, Cytoxan d/c'd 12/19/2018 IV Iron as needed   Interim History:  Ms. Burruss is here today for follow-up and treatment. She states that she is feeling much better and her energy has improved.  Her Hgb is up to 9.3. Retic count is 9.7, iron saturation 29%.  Epogen last visit was elevated at 180.  She has not noted any blood loss. No bruising or petechiae.  She has mild SOB with over exertion and will take a break to rest if needed.  No fever, chills, n/v, cough, rash, dizziness, chest pain, palpitations, abdominal pain or changes in bowel or bladder habits.  No swelling, tenderness in her extremities.  The tingling and numbness in her fingertips seems a little worse. She will try taking vitamin B 6 daily and see if she notes any changes.  No falls or syncope. She is ambulating with a cane for added support.  She has maintained a good appetite and is staying well hydrated. Her weight is stable.   ECOG Performance Status: 1 - Symptomatic but completely ambulatory  Medications:  Allergies as of 01/09/2019      Reactions   Lisinopril Swelling   Facial swelling  Facial swelling    Timolol Other (See Comments), Swelling   Pregabalin Other (See Comments)   constipation constipation constipation      Medication List       Accurate as of January 09, 2019  1:05 PM. If you have any questions, ask your nurse or doctor.        STOP taking these medications   Propylene Glycol 0.6 % Soln Stopped by: Laverna Peace, NP   Vitamin D (Ergocalciferol) 1.25 MG (50000 UT) Caps capsule  Commonly known as: DRISDOL Stopped by: Laverna Peace, NP     TAKE these medications   alendronate 70 MG tablet Commonly known as: FOSAMAX Take by mouth once a week.   amLODipine 5 MG tablet Commonly known as: NORVASC Take 5 mg by mouth daily.   apixaban 5 MG Tabs tablet Commonly known as: ELIQUIS Take 5 mg by mouth 2 (two) times daily.   aspirin 81 MG tablet Take 81 mg by mouth daily.   atorvastatin 80 MG tablet Commonly known as: LIPITOR Take 40 mg by mouth daily.   Calcium 600-D 600-400 MG-UNIT Tabs Generic drug: Calcium Carbonate-Vitamin D3 Take by mouth.   diphenhydrAMINE 25 MG tablet Commonly known as: BENADRYL Take 25 mg by mouth at bedtime as needed.   dorzolamide 2 % ophthalmic solution Commonly known as: TRUSOPT Place 1 drop into both eyes 3 (three) times daily.   folic acid 1 MG tablet Commonly known as: FOLVITE Take 1 tablet (1 mg total) by mouth daily.   furosemide 40 MG tablet Commonly known as: LASIX Take 40 mg by mouth daily.   gabapentin 300 MG capsule Commonly known as: NEURONTIN Take 1 capsule (300 mg total) by mouth at bedtime. 90 day supply   ipratropium-albuterol 0.5-2.5 (3) MG/3ML Soln Commonly known as: DUONEB INHALE 3 ML BY NEBULIZATION EVERY SIX (6) HOURS AS NEEDED.   latanoprost 0.005 % ophthalmic solution Commonly known as: XALATAN Place 1  drop into both eyes at bedtime.   lidocaine-prilocaine cream Commonly known as: EMLA Apply 1 application topically as needed.   loratadine 10 MG tablet Commonly known as: CLARITIN OTC Take by mouth one a day for 10 days, then once a day as needed -allergies.   metFORMIN 500 MG tablet Commonly known as: GLUCOPHAGE Take 500 mg by mouth daily with breakfast.   ondansetron 8 MG tablet Commonly known as: ZOFRAN Take 1 tablet (8 mg total) by mouth every 8 (eight) hours as needed for nausea or vomiting.   potassium chloride 10 MEQ tablet Commonly known as: KLOR-CON Take 10 mEq by  mouth daily.   raloxifene 60 MG tablet Commonly known as: EVISTA Take 60 mg by mouth daily.   ranitidine 150 MG tablet Commonly known as: ZANTAC 1 tablet by mouth twice a day for month and then take twice a day for needed - GERD   tiZANidine 4 MG capsule Commonly known as: ZANAFLEX Take 4 mg by mouth at bedtime.       Allergies:  Allergies  Allergen Reactions  . Lisinopril Swelling    Facial swelling  Facial swelling    . Timolol Other (See Comments) and Swelling  . Pregabalin Other (See Comments)    constipation constipation  constipation    Past Medical History, Surgical history, Social history, and Family History were reviewed and updated.  Review of Systems: All other 10 point review of systems is negative.   Physical Exam:  weight is 165 lb (74.8 kg). Her temporal temperature is 97.1 F (36.2 C) (abnormal). Her blood pressure is 144/71 (abnormal) and her pulse is 85. Her respiration is 16 and oxygen saturation is 95%.   Wt Readings from Last 3 Encounters:  01/09/19 165 lb (74.8 kg)  12/19/18 168 lb (76.2 kg)  11/28/18 166 lb 12.8 oz (75.7 kg)    Ocular: Sclerae unicteric, pupils equal, round and reactive to light Ear-nose-throat: Oropharynx clear, dentition fair Lymphatic: No cervical or supraclavicular adenopathy Lungs no rales or rhonchi, good excursion bilaterally Heart regular rate and rhythm, no murmur appreciated Abd soft, nontender, positive bowel sounds, no liver or spleen tip palpated on exam, no fluid wave  MSK no focal spinal tenderness, no joint edema Neuro: non-focal, well-oriented, appropriate affect Breasts: Deferred   Lab Results  Component Value Date   WBC 3.7 (L) 01/09/2019   HGB 9.3 (L) 01/09/2019   HCT 31.0 (L) 01/09/2019   MCV 101.6 (H) 01/09/2019   PLT 145 (L) 01/09/2019   Lab Results  Component Value Date   FERRITIN 2,229 (H) 12/19/2018   IRON 86 01/09/2019   TIBC 299 01/09/2019   UIBC 213 01/09/2019   IRONPCTSAT 29  01/09/2019   Lab Results  Component Value Date   RETICCTPCT 9.7 (H) 01/09/2019   RBC 3.04 (L) 01/09/2019   RETICCTABS 59.2 11/13/2013   No results found for: KPAFRELGTCHN, LAMBDASER, KAPLAMBRATIO Lab Results  Component Value Date   IGGSERUM 557 (L) 06/19/2013   IGA 100 06/19/2013   IGMSERUM 95 06/19/2013   Lab Results  Component Value Date   TOTALPROTELP 6.3 09/16/2011   ALBUMINELP 59.9 09/16/2011   A1GS 7.3 (H) 09/16/2011   A2GS 9.9 09/16/2011   BETS 8.4 (H) 09/16/2011   BETA2SER 4.8 09/16/2011   GAMS 9.7 (L) 09/16/2011   MSPIKE NOT DET 09/16/2011   SPEI * 09/16/2011     Chemistry      Component Value Date/Time   NA 140 01/09/2019 0917   NA 140  09/16/2016 0945   K 4.1 01/09/2019 0917   K 3.2 (L) 09/16/2016 0945   CL 105 01/09/2019 0917   CL 101 12/08/2014 1138   CL 99 05/28/2012 1036   CO2 28 01/09/2019 0917   CO2 26 09/16/2016 0945   BUN 12 01/09/2019 0917   BUN 9.0 09/16/2016 0945   CREATININE 0.61 01/09/2019 0917   CREATININE 0.7 09/16/2016 0945      Component Value Date/Time   CALCIUM 8.9 01/09/2019 0917   CALCIUM 9.0 09/16/2016 0945   ALKPHOS 105 01/09/2019 0917   ALKPHOS 122 09/16/2016 0945   AST 21 01/09/2019 0917   AST 25 09/16/2016 0945   ALT 11 01/09/2019 0917   ALT 15 09/16/2016 0945   BILITOT 2.2 (H) 01/09/2019 0917   BILITOT 0.90 09/16/2016 0945       Impression and Plan: Ms. Chal[place]ka is a very pleasant 85 yo African American female with a history of marginal zone lymphoma, angioedema and autoimmune hemolytic anemia.  She is doing well and feeling much better.  We will proceed with Rituxan treatment today as planned.  She will start taking vitamin B 6 daily for the numbness and tingling in her fingertips. I spoke with Dr. Marin Olp and we will hold off on repeat scans for now unless she starts to display s/s of progression.  We will plan to see her back in another 3 weeks.  She will contact our office with any questions or concerns. We can  certainly see her sooner if needed.   Laverna Peace, NP 11/18/20201:05 PM

## 2019-01-09 NOTE — Patient Instructions (Signed)
[facility] Discharge Instructions for Patients Receiving Chemotherapy  Today you received the following chemotherapy agents Rituxan  To help prevent nausea and vomiting after your treatment, we encourage you to take your nausea medication as prescribed.  If you develop nausea and vomiting that is not controlled by your nausea medication, call the clinic.   BELOW ARE SYMPTOMS THAT SHOULD BE REPORTED IMMEDIATELY:  *FEVER GREATER THAN 100.5 F  *CHILLS WITH OR WITHOUT FEVER  NAUSEA AND VOMITING THAT IS NOT CONTROLLED WITH YOUR NAUSEA MEDICATION  *UNUSUAL SHORTNESS OF BREATH  *UNUSUAL BRUISING OR BLEEDING  TENDERNESS IN MOUTH AND THROAT WITH OR WITHOUT PRESENCE OF ULCERS  *URINARY PROBLEMS  *BOWEL PROBLEMS  UNUSUAL RASH Items with * indicate a potential emergency and should be followed up as soon as possible.  Feel free to call the clinic should you have any questions or concerns. The clinic phone number is [phone].  Please show the [place] at check-in to the Emergency Department and triage nurse.  Iron Sucrose injection What is this medicine? IRON SUCROSE (AHY ern SOO krohs) is an iron complex. Iron is used to make healthy red blood cells, which carry oxygen and nutrients throughout the body. This medicine is used to treat iron deficiency anemia in people with chronic kidney disease. This medicine may be used for other purposes; ask your health care provider or pharmacist if you have questions. COMMON BRAND NAME(S): Venofer What should I tell my health care provider before I take this medicine? They need to know if you have any of these conditions:  anemia not caused by low iron levels  heart disease  high levels of iron in the blood  kidney disease  liver disease  an unusual or allergic reaction to iron, other medicines, foods, dyes, or preservatives  pregnant or trying to get pregnant  breast-feeding How should I use this  medicine? This medicine is for infusion into a vein. It is given by a health care professional in a hospital or clinic setting. Talk to your pediatrician regarding the use of this medicine in children. While this drug may be prescribed for children as young as 2 years for selected conditions, precautions do apply. Overdosage: If you think you have taken too much of this medicine contact a poison control center or emergency room at once. NOTE: This medicine is only for you. Do not share this medicine with others. What if I miss a dose? It is important not to miss your dose. Call your doctor or health care professional if you are unable to keep an appointment. What may interact with this medicine? Do not take this medicine with any of the following medications:  deferoxamine  dimercaprol  other iron products This medicine may also interact with the following medications:  chloramphenicol  deferasirox This list may not describe all possible interactions. Give your health care provider a list of all the medicines, herbs, non-prescription drugs, or dietary supplements you use. Also tell them if you smoke, drink alcohol, or use illegal drugs. Some items may interact with your medicine. What should I watch for while using this medicine? Visit your doctor or healthcare professional regularly. Tell your doctor or healthcare professional if your symptoms do not start to get better or if they get worse. You may need blood work done while you are taking this medicine. You may need to follow a special diet. Talk to your doctor. Foods that contain iron include: whole grains/cereals, dried fruits, beans, or  peas, leafy green vegetables, and organ meats (liver, kidney). What side effects may I notice from receiving this medicine? Side effects that you should report to your doctor or health care professional as soon as possible:  allergic reactions like skin rash, itching or hives, swelling of the face,  lips, or tongue  breathing problems  changes in blood pressure  cough  fast, irregular heartbeat  feeling faint or lightheaded, falls  fever or chills  flushing, sweating, or hot feelings  joint or muscle aches/pains  seizures  swelling of the ankles or feet  unusually weak or tired Side effects that usually do not require medical attention (report to your doctor or health care professional if they continue or are bothersome):  diarrhea  feeling achy  headache  irritation at site where injected  nausea, vomiting  stomach upset  tiredness This list may not describe all possible side effects. Call your doctor for medical advice about side effects. You may report side effects to FDA at 1-800-FDA-1088. Where should I keep my medicine? This drug is given in a hospital or clinic and will not be stored at home. NOTE: This sheet is a summary. It may not cover all possible information. If you have questions about this medicine, talk to your doctor, pharmacist, or health care provider.  2020 Elsevier/Gold Standard (2010-11-18 17:14:35) [facility] Discharge Instructions for Patients Receiving Chemotherapy  Today you received the following chemotherapy agents Rituxan  To help prevent nausea and vomiting after your treatment, we encourage you to take your nausea medication as prescribed.  If you develop nausea and vomiting that is not controlled by your nausea medication, call the clinic.   BELOW ARE SYMPTOMS THAT SHOULD BE REPORTED IMMEDIATELY:  *FEVER GREATER THAN 100.5 F  *CHILLS WITH OR WITHOUT FEVER  NAUSEA AND VOMITING THAT IS NOT CONTROLLED WITH YOUR NAUSEA MEDICATION  *UNUSUAL SHORTNESS OF BREATH  *UNUSUAL BRUISING OR BLEEDING  TENDERNESS IN MOUTH AND THROAT WITH OR WITHOUT PRESENCE OF ULCERS  *URINARY PROBLEMS  *BOWEL PROBLEMS  UNUSUAL RASH Items with * indicate a potential emergency and should be followed up as soon as  possible.  Feel free to call the clinic should you have any questions or concerns. The clinic phone number is [phone].  Please show the [place] at check-in to the Emergency Department and triage nurse.

## 2019-01-15 ENCOUNTER — Other Ambulatory Visit: Payer: Medicare Other

## 2019-01-15 ENCOUNTER — Ambulatory Visit: Payer: Medicare Other | Admitting: Family

## 2019-01-30 ENCOUNTER — Inpatient Hospital Stay: Payer: Medicare Other

## 2019-01-30 ENCOUNTER — Encounter: Payer: Self-pay | Admitting: Family

## 2019-01-30 ENCOUNTER — Inpatient Hospital Stay: Payer: Medicare Other | Attending: Hematology & Oncology

## 2019-01-30 ENCOUNTER — Inpatient Hospital Stay (HOSPITAL_BASED_OUTPATIENT_CLINIC_OR_DEPARTMENT_OTHER): Payer: Medicare Other | Admitting: Family

## 2019-01-30 ENCOUNTER — Other Ambulatory Visit: Payer: Self-pay

## 2019-01-30 VITALS — BP 140/54 | HR 76 | Temp 97.1°F | Resp 17 | Ht 62.0 in | Wt 160.1 lb

## 2019-01-30 DIAGNOSIS — E785 Hyperlipidemia, unspecified: Secondary | ICD-10-CM | POA: Diagnosis not present

## 2019-01-30 DIAGNOSIS — K219 Gastro-esophageal reflux disease without esophagitis: Secondary | ICD-10-CM | POA: Insufficient documentation

## 2019-01-30 DIAGNOSIS — D5911 Warm autoimmune hemolytic anemia: Secondary | ICD-10-CM | POA: Insufficient documentation

## 2019-01-30 DIAGNOSIS — D589 Hereditary hemolytic anemia, unspecified: Secondary | ICD-10-CM

## 2019-01-30 DIAGNOSIS — Z7982 Long term (current) use of aspirin: Secondary | ICD-10-CM | POA: Diagnosis not present

## 2019-01-30 DIAGNOSIS — D500 Iron deficiency anemia secondary to blood loss (chronic): Secondary | ICD-10-CM

## 2019-01-30 DIAGNOSIS — R200 Anesthesia of skin: Secondary | ICD-10-CM | POA: Insufficient documentation

## 2019-01-30 DIAGNOSIS — I10 Essential (primary) hypertension: Secondary | ICD-10-CM | POA: Diagnosis not present

## 2019-01-30 DIAGNOSIS — Z7984 Long term (current) use of oral hypoglycemic drugs: Secondary | ICD-10-CM | POA: Diagnosis not present

## 2019-01-30 DIAGNOSIS — C8307 Small cell B-cell lymphoma, spleen: Secondary | ICD-10-CM | POA: Insufficient documentation

## 2019-01-30 DIAGNOSIS — C8583 Other specified types of non-Hodgkin lymphoma, intra-abdominal lymph nodes: Secondary | ICD-10-CM

## 2019-01-30 DIAGNOSIS — R0602 Shortness of breath: Secondary | ICD-10-CM | POA: Diagnosis not present

## 2019-01-30 DIAGNOSIS — R202 Paresthesia of skin: Secondary | ICD-10-CM | POA: Diagnosis not present

## 2019-01-30 DIAGNOSIS — Z79899 Other long term (current) drug therapy: Secondary | ICD-10-CM | POA: Insufficient documentation

## 2019-01-30 DIAGNOSIS — Z7901 Long term (current) use of anticoagulants: Secondary | ICD-10-CM | POA: Diagnosis not present

## 2019-01-30 DIAGNOSIS — E119 Type 2 diabetes mellitus without complications: Secondary | ICD-10-CM | POA: Insufficient documentation

## 2019-01-30 LAB — CMP (CANCER CENTER ONLY)
ALT: 9 U/L (ref 0–44)
AST: 18 U/L (ref 15–41)
Albumin: 4.3 g/dL (ref 3.5–5.0)
Alkaline Phosphatase: 103 U/L (ref 38–126)
Anion gap: 8 (ref 5–15)
BUN: 9 mg/dL (ref 8–23)
CO2: 25 mmol/L (ref 22–32)
Calcium: 9 mg/dL (ref 8.9–10.3)
Chloride: 108 mmol/L (ref 98–111)
Creatinine: 0.61 mg/dL (ref 0.44–1.00)
GFR, Est AFR Am: >60 mL/min (ref >60)
GFR, Estimated: >60 mL/min (ref >60)
Glucose, Bld: 133 mg/dL — ABNORMAL HIGH (ref 70–99)
Potassium: 4 mmol/L (ref 3.5–5.1)
Sodium: 141 mmol/L (ref 135–145)
Total Bilirubin: 2.4 mg/dL — ABNORMAL HIGH (ref 0.3–1.2)
Total Protein: 6.3 g/dL — ABNORMAL LOW (ref 6.5–8.1)

## 2019-01-30 LAB — CBC WITH DIFFERENTIAL (CANCER CENTER ONLY)
Abs Immature Granulocytes: 0.04 10*3/uL (ref 0.00–0.07)
Basophils Absolute: 0 10*3/uL (ref 0.0–0.1)
Basophils Relative: 1 %
Eosinophils Absolute: 0.1 10*3/uL (ref 0.0–0.5)
Eosinophils Relative: 1 %
HCT: 28.2 % — ABNORMAL LOW (ref 36.0–46.0)
Hemoglobin: 8.4 g/dL — ABNORMAL LOW (ref 12.0–15.0)
Immature Granulocytes: 1 %
Lymphocytes Relative: 10 %
Lymphs Abs: 0.4 10*3/uL — ABNORMAL LOW (ref 0.7–4.0)
MCH: 31.7 pg (ref 26.0–34.0)
MCHC: 29.8 g/dL — ABNORMAL LOW (ref 30.0–36.0)
MCV: 106.4 fL — ABNORMAL HIGH (ref 80.0–100.0)
Monocytes Absolute: 0.2 10*3/uL (ref 0.1–1.0)
Monocytes Relative: 5 %
Neutro Abs: 2.8 10*3/uL (ref 1.7–7.7)
Neutrophils Relative %: 82 %
Platelet Count: 131 10*3/uL — ABNORMAL LOW (ref 150–400)
RBC: 2.65 MIL/uL — ABNORMAL LOW (ref 3.87–5.11)
RDW: 17.2 % — ABNORMAL HIGH (ref 11.5–15.5)
WBC Count: 3.5 10*3/uL — ABNORMAL LOW (ref 4.0–10.5)
nRBC: 0 % (ref 0.0–0.2)

## 2019-01-30 LAB — LACTATE DEHYDROGENASE: LDH: 303 U/L — ABNORMAL HIGH (ref 98–192)

## 2019-01-30 MED ORDER — SODIUM CHLORIDE 0.9% FLUSH
10.0000 mL | INTRAVENOUS | Status: AC | PRN
  Administered 2019-01-30: 10 mL via INTRAVENOUS
  Filled 2019-01-30: qty 10

## 2019-01-30 MED ORDER — HEPARIN SOD (PORK) LOCK FLUSH 100 UNIT/ML IV SOLN
500.0000 [IU] | Freq: Once | INTRAVENOUS | Status: AC | PRN
  Administered 2019-01-30: 500 [IU] via INTRAVENOUS
  Filled 2019-01-30: qty 5

## 2019-01-30 NOTE — Progress Notes (Signed)
No treatment per MD 

## 2019-01-30 NOTE — Addendum Note (Signed)
Addended by: Shelda Altes on: 01/30/2019 11:39 AM   Modules accepted: Orders

## 2019-01-30 NOTE — Patient Instructions (Signed)

## 2019-01-30 NOTE — Progress Notes (Signed)
Hematology and Oncology Follow Up Visit  Kristine Sullivan [id number] 1931-12-01 85 y.o. 01/30/2019   Principle Diagnosis:  Marginal zone lymphoma -- recurrent Iron deficiency anemia  Past therapy: CVP -- completed 8 cycles on 04/09/2012 Maintenance Rituxan - 2 years completed in June 2016 IV iron - last dose on 06/2018  Current Therapy:   Rituxan/Cytoxan -- startedon 11/08/2018, s/p cycle 2, Cytoxan d/c'd 12/19/2018 IV Iron as needed   Interim History:  Kristine Sullivan is here today for follow-up and treatment. She states that she is feeling a little better. Her energy is a bit improved.  She has started taking vitamin B 6 and feels that the numbness and tingling in her hands and feet seems a little less.  Hgb is 8.4, MCV 106 and platelet count 131.  She has not noted any episodes of bleeding, no bruising orp  She occasionally has mild SOB with over exertion and will take a break to rest if needed.  No fever, chills, n/v, cough, rash, dizziness, chest pain, palpitations, abdominal pain or changes in bowel or bladder habits.  No swelling in her extremities at this time.  No falls or syncopal episodes to report.  She states that her appetite comes and goes but she is staying hydrated. Her weight is stable.   ECOG Performance Status: 1 - Symptomatic but completely ambulatory  Medications:  Allergies as of 01/30/2019      Reactions   Lisinopril Swelling   Facial swelling  Facial swelling    Timolol Other (See Comments), Swelling   Pregabalin Other (See Comments)   constipation constipation constipation      Medication List       Accurate as of January 30, 2019 10:24 AM. If you have any questions, ask your nurse or doctor.        alendronate 70 MG tablet Commonly known as: FOSAMAX Take by mouth once a week.   amLODipine 5 MG tablet Commonly known as: NORVASC Take 5 mg by mouth daily.   apixaban 5 MG Tabs tablet Commonly known as: ELIQUIS Take 5 mg by mouth 2  (two) times daily.   aspirin 81 MG tablet Take 81 mg by mouth daily.   atorvastatin 80 MG tablet Commonly known as: LIPITOR Take 40 mg by mouth daily.   Calcium 600-D 600-400 MG-UNIT Tabs Generic drug: Calcium Carbonate-Vitamin D3 Take by mouth.   diphenhydrAMINE 25 MG tablet Commonly known as: BENADRYL Take 25 mg by mouth at bedtime as needed.   dorzolamide 2 % ophthalmic solution Commonly known as: TRUSOPT Place 1 drop into both eyes 3 (three) times daily.   folic acid 1 MG tablet Commonly known as: FOLVITE Take 1 tablet (1 mg total) by mouth daily.   furosemide 40 MG tablet Commonly known as: LASIX Take 40 mg by mouth daily.   gabapentin 300 MG capsule Commonly known as: NEURONTIN Take 1 capsule (300 mg total) by mouth at bedtime. 90 day supply   ipratropium-albuterol 0.5-2.5 (3) MG/3ML Soln Commonly known as: DUONEB INHALE 3 ML BY NEBULIZATION EVERY SIX (6) HOURS AS NEEDED.   latanoprost 0.005 % ophthalmic solution Commonly known as: XALATAN Place 1 drop into both eyes at bedtime.   lidocaine-prilocaine cream Commonly known as: EMLA Apply 1 application topically as needed.   loratadine 10 MG tablet Commonly known as: CLARITIN OTC Take by mouth one a day for 10 days, then once a day as needed -allergies.   metFORMIN 500 MG tablet Commonly known as: GLUCOPHAGE Take 500 mg  by mouth daily with breakfast.   ondansetron 8 MG tablet Commonly known as: ZOFRAN Take 1 tablet (8 mg total) by mouth every 8 (eight) hours as needed for nausea or vomiting.   potassium chloride 10 MEQ tablet Commonly known as: KLOR-CON Take 10 mEq by mouth daily.   raloxifene 60 MG tablet Commonly known as: EVISTA Take 60 mg by mouth daily.   ranitidine 150 MG tablet Commonly known as: ZANTAC 1 tablet by mouth twice a day for month and then take twice a day for needed - GERD   tiZANidine 4 MG capsule Commonly known as: ZANAFLEX Take 4 mg by mouth at bedtime.        Allergies:  Allergies  Allergen Reactions  . Lisinopril Swelling    Facial swelling  Facial swelling    . Timolol Other (See Comments) and Swelling  . Pregabalin Other (See Comments)    constipation constipation  constipation    Past Medical History, Surgical history, Social history, and Family History were reviewed and updated.  Review of Systems: All other 10 point review of systems is negative.   Physical Exam:  vitals were not taken for this visit.   Wt Readings from Last 3 Encounters:  01/09/19 165 lb (74.8 kg)  12/19/18 168 lb (76.2 kg)  11/28/18 166 lb 12.8 oz (75.7 kg)    Ocular: Sclerae unicteric, pupils equal, round and reactive to light Ear-nose-throat: Oropharynx clear, dentition fair Lymphatic: No cervical or supraclavicular adenopathy Lungs no rales or rhonchi, good excursion bilaterally Heart regular rate and rhythm, no murmur appreciated Abd soft, nontender, positive bowel sounds, no liver or spleen tip palpated on exam, no fluid wave  MSK no focal spinal tenderness, no joint edema Neuro: non-focal, well-oriented, appropriate affect Breasts: Deferred   Lab Results  Component Value Date   WBC 3.5 (L) 01/30/2019   HGB 8.4 (L) 01/30/2019   HCT 28.2 (L) 01/30/2019   MCV 106.4 (H) 01/30/2019   PLT 131 (L) 01/30/2019   Lab Results  Component Value Date   FERRITIN 5,259 (H) 01/09/2019   IRON 86 01/09/2019   TIBC 299 01/09/2019   UIBC 213 01/09/2019   IRONPCTSAT 29 01/09/2019   Lab Results  Component Value Date   RETICCTPCT 9.7 (H) 01/09/2019   RBC 2.65 (L) 01/30/2019   RETICCTABS 59.2 11/13/2013   No results found for: Nils Pyle, [facility] Lab Results  Component Value Date   IGGSERUM 557 (L) 06/19/2013   IGA 100 06/19/2013   IGMSERUM 95 06/19/2013   Lab Results  Component Value Date   TOTALPROTELP 6.3 09/16/2011   ALBUMINELP 59.9 09/16/2011   A1GS 7.3 (H) 09/16/2011   A2GS 9.9 09/16/2011   BETS 8.4 (H) 09/16/2011    BETA2SER 4.8 09/16/2011   GAMS 9.7 (L) 09/16/2011   MSPIKE NOT DET 09/16/2011   SPEI * 09/16/2011     Chemistry      Component Value Date/Time   NA 141 01/30/2019 0947   NA 140 09/16/2016 0945   K 4.0 01/30/2019 0947   K 3.2 (L) 09/16/2016 0945   CL 108 01/30/2019 0947   CL 101 12/08/2014 1138   CL 99 05/28/2012 1036   CO2 25 01/30/2019 0947   CO2 26 09/16/2016 0945   BUN 9 01/30/2019 0947   BUN 9.0 09/16/2016 0945   CREATININE 0.61 01/30/2019 0947   CREATININE 0.7 09/16/2016 0945      Component Value Date/Time   CALCIUM 9.0 01/30/2019 0947   CALCIUM 9.0 09/16/2016  0945   ALKPHOS 103 01/30/2019 0947   ALKPHOS 122 09/16/2016 0945   AST 18 01/30/2019 0947   AST 25 09/16/2016 0945   ALT 9 01/30/2019 0947   ALT 15 09/16/2016 0945   BILITOT 2.4 (H) 01/30/2019 0947   BILITOT 0.90 09/16/2016 0945       Impression and Plan: Kristine Sullivan is a very pleasant 85 yo African American female with a history of marginal zone lymphoma, angioedema and autoimmune hemolytic anemia. We will proceed with treatment today as planned.  We will plan to see her back in another 3 weeks for follow-up.  She promises to contact our office with any questions or concerns. We can certainly see her sooner if needed.   Laverna Peace, NP 12/9/202010:24 AM

## 2019-02-12 ENCOUNTER — Other Ambulatory Visit: Payer: Self-pay | Admitting: Family

## 2019-02-12 NOTE — Progress Notes (Signed)
I spoke with APP with [facility] and patient is complaining of SOB and fatigue but not in distress at that time. Hgb is unchanged at 8.4. They have swabbed her for Covid and result is pending at this time.

## 2019-02-20 ENCOUNTER — Ambulatory Visit: Payer: Medicare Other

## 2019-02-20 ENCOUNTER — Other Ambulatory Visit: Payer: Medicare Other

## 2019-02-20 ENCOUNTER — Ambulatory Visit: Payer: Medicare Other | Admitting: Hematology & Oncology

## 2019-02-21 ENCOUNTER — Inpatient Hospital Stay: Payer: Medicare Other

## 2019-02-21 ENCOUNTER — Other Ambulatory Visit: Payer: Self-pay

## 2019-02-21 ENCOUNTER — Encounter: Payer: Self-pay | Admitting: Hematology & Oncology

## 2019-02-21 ENCOUNTER — Telehealth: Payer: Self-pay

## 2019-02-21 ENCOUNTER — Encounter (INDEPENDENT_AMBULATORY_CARE_PROVIDER_SITE_OTHER): Payer: Self-pay

## 2019-02-21 ENCOUNTER — Inpatient Hospital Stay (HOSPITAL_BASED_OUTPATIENT_CLINIC_OR_DEPARTMENT_OTHER): Payer: Medicare Other | Admitting: Hematology & Oncology

## 2019-02-21 VITALS — BP 125/37 | HR 85 | Temp 97.1°F | Resp 20 | Wt 164.0 lb

## 2019-02-21 DIAGNOSIS — C8307 Small cell B-cell lymphoma, spleen: Secondary | ICD-10-CM

## 2019-02-21 DIAGNOSIS — D5912 Cold autoimmune hemolytic anemia: Secondary | ICD-10-CM

## 2019-02-21 DIAGNOSIS — D5911 Warm autoimmune hemolytic anemia: Secondary | ICD-10-CM | POA: Diagnosis not present

## 2019-02-21 DIAGNOSIS — D589 Hereditary hemolytic anemia, unspecified: Secondary | ICD-10-CM

## 2019-02-21 DIAGNOSIS — D500 Iron deficiency anemia secondary to blood loss (chronic): Secondary | ICD-10-CM

## 2019-02-21 LAB — CBC WITH DIFFERENTIAL (CANCER CENTER ONLY)
Abs Immature Granulocytes: 0.07 10*3/uL (ref 0.00–0.07)
Basophils Absolute: 0 10*3/uL (ref 0.0–0.1)
Basophils Relative: 0 %
Eosinophils Absolute: 0.1 10*3/uL (ref 0.0–0.5)
Eosinophils Relative: 2 %
HCT: 25.6 % — ABNORMAL LOW (ref 36.0–46.0)
Hemoglobin: 7.2 g/dL — ABNORMAL LOW (ref 12.0–15.0)
Immature Granulocytes: 3 %
Lymphocytes Relative: 10 %
Lymphs Abs: 0.3 10*3/uL — ABNORMAL LOW (ref 0.7–4.0)
MCH: 31.4 pg (ref 26.0–34.0)
MCHC: 28.1 g/dL — ABNORMAL LOW (ref 30.0–36.0)
MCV: 111.8 fL — ABNORMAL HIGH (ref 80.0–100.0)
Monocytes Absolute: 0.2 10*3/uL (ref 0.1–1.0)
Monocytes Relative: 7 %
Neutro Abs: 2.2 10*3/uL (ref 1.7–7.7)
Neutrophils Relative %: 78 %
Platelet Count: 122 10*3/uL — ABNORMAL LOW (ref 150–400)
RBC: 2.29 MIL/uL — ABNORMAL LOW (ref 3.87–5.11)
RDW: 16.8 % — ABNORMAL HIGH (ref 11.5–15.5)
WBC Count: 2.8 10*3/uL — ABNORMAL LOW (ref 4.0–10.5)
nRBC: 0 % (ref 0.0–0.2)

## 2019-02-21 LAB — CMP (CANCER CENTER ONLY)
ALT: 9 U/L (ref 0–44)
AST: 18 U/L (ref 15–41)
Albumin: 4.1 g/dL (ref 3.5–5.0)
Alkaline Phosphatase: 90 U/L (ref 38–126)
Anion gap: 8 (ref 5–15)
BUN: 12 mg/dL (ref 8–23)
CO2: 27 mmol/L (ref 22–32)
Calcium: 8.9 mg/dL (ref 8.9–10.3)
Chloride: 107 mmol/L (ref 98–111)
Creatinine: 0.65 mg/dL (ref 0.44–1.00)
GFR, Est AFR Am: >60 mL/min (ref >60)
GFR, Estimated: >60 mL/min (ref >60)
Glucose, Bld: 139 mg/dL — ABNORMAL HIGH (ref 70–99)
Potassium: 3.9 mmol/L (ref 3.5–5.1)
Sodium: 142 mmol/L (ref 135–145)
Total Bilirubin: 2.9 mg/dL — ABNORMAL HIGH (ref 0.3–1.2)
Total Protein: 5.8 g/dL — ABNORMAL LOW (ref 6.5–8.1)

## 2019-02-21 LAB — RETICULOCYTES
Immature Retic Fract: 39 % — ABNORMAL HIGH (ref 2.3–15.9)
RBC.: 2.3 MIL/uL — ABNORMAL LOW (ref 3.87–5.11)
Retic Count, Absolute: 395.1 10*3/uL — ABNORMAL HIGH (ref 19.0–186.0)
Retic Ct Pct: 17.2 % — ABNORMAL HIGH (ref 0.4–3.1)

## 2019-02-21 LAB — SAMPLE TO BLOOD BANK

## 2019-02-21 LAB — IRON AND TIBC
Iron: 102 ug/dL (ref 41–142)
Saturation Ratios: 35 % (ref 21–57)
TIBC: 295 ug/dL (ref 236–444)
UIBC: 193 ug/dL (ref 120–384)

## 2019-02-21 LAB — LACTATE DEHYDROGENASE: LDH: 327 U/L — ABNORMAL HIGH (ref 98–192)

## 2019-02-21 LAB — DIRECT ANTIGLOBULIN TEST (NOT AT ARMC)
DAT, IgG: POSITIVE
DAT, complement: NEGATIVE

## 2019-02-21 LAB — FERRITIN: Ferritin: 1122 ng/mL — ABNORMAL HIGH (ref 11–307)

## 2019-02-21 MED ORDER — SODIUM CHLORIDE 0.9% FLUSH
10.0000 mL | INTRAVENOUS | Status: AC | PRN
  Administered 2019-02-21: 10 mL
  Filled 2019-02-21: qty 10

## 2019-02-21 MED ORDER — ACETAMINOPHEN 325 MG PO TABS
ORAL_TABLET | ORAL | Status: AC
  Filled 2019-02-21: qty 2

## 2019-02-21 MED ORDER — ACETAMINOPHEN 325 MG PO TABS
650.0000 mg | ORAL_TABLET | Freq: Once | ORAL | Status: AC
  Administered 2019-02-21: 650 mg via ORAL

## 2019-02-21 MED ORDER — HEPARIN SOD (PORK) LOCK FLUSH 100 UNIT/ML IV SOLN
250.0000 [IU] | INTRAVENOUS | Status: AC | PRN
  Administered 2019-02-21: 500 [IU]
  Filled 2019-02-21: qty 5

## 2019-02-21 MED ORDER — SODIUM CHLORIDE 0.9% IV SOLUTION
250.0000 mL | Freq: Once | INTRAVENOUS | Status: AC
  Administered 2019-02-21: 250 mL via INTRAVENOUS
  Filled 2019-02-21: qty 250

## 2019-02-21 NOTE — Progress Notes (Signed)
Hematology and Oncology Follow Up Visit  Kristine Sullivan [id number] 1931-11-07 85 y.o. 02/21/2019   Principle Diagnosis:  Marginal zone lymphoma -- recurrent Iron deficiency anemia Autoimmune hemolytic anemia -- warm antibody  Past therapy: CVP -- completed 8 cycles on 04/09/2012 Maintenance Rituxan - 2 years completed in June 2016 IV iron - last dose on 06/2018  Current Therapy:   Rituxan/Cytoxan -- startedon 11/08/2018, s/p cycle 2, Cytoxan d/c'd 12/19/2018 IV Iron as needed   Interim History:  Kristine Sullivan is here today for follow-up.  Unfortunately, her hemolytic anemia is still very active.  Despite the Rituxan, we have really gained very little with respect to her hemolytic anemia.  Her hemoglobin keeps trending downward.  Today, her hemoglobin is 7.2.  I just am not sure why the Rituxan did not help with the hemolytic anemia.  I am going to have to do an ultrasound of her spleen to see if there is any splenomegaly.  We may have to ultimately consider a bone marrow biopsy to see if there is lymphoma in the marrow that might be triggering the hemolytic anemia.  She feels a little tired.  There is no evidence of heart failure which is a blessing.  She is eating okay.  She is having no nausea or vomiting.  There is no cough.  She has had no shortness of breath.  She has had no change in bowel or bladder habits.  There is been no leg swelling.  She has had no rashes.  Overall, I would say her performance status is ECOG 2.  Medications:  Allergies as of 02/21/2019      Reactions   Lisinopril Swelling   Facial swelling    Pregabalin Other (See Comments)   constipation constipation constipation   Timolol Swelling, Other (See Comments)      Medication List       Accurate as of February 21, 2019 10:44 AM. If you have any questions, ask your nurse or doctor.        alendronate 70 MG tablet Commonly known as: FOSAMAX Take by mouth once a week.   amLODipine  5 MG tablet Commonly known as: NORVASC Take 5 mg by mouth daily.   apixaban 5 MG Tabs tablet Commonly known as: ELIQUIS Take 5 mg by mouth 2 (two) times daily.   aspirin 81 MG tablet Take 81 mg by mouth daily.   atorvastatin 80 MG tablet Commonly known as: LIPITOR Take 40 mg by mouth daily.   Calcium 600-D 600-400 MG-UNIT Tabs Generic drug: Calcium Carbonate-Vitamin D3 Take by mouth.   diphenhydrAMINE 25 MG tablet Commonly known as: BENADRYL Take 25 mg by mouth at bedtime as needed.   diphenhydrAMINE 25 MG tablet Commonly known as: SOMINEX Take 25 mg by mouth daily.   dorzolamide 2 % ophthalmic solution Commonly known as: TRUSOPT Place 1 drop into both eyes 3 (three) times daily.   ferrous sulfate 325 (65 FE) MG tablet Take 325 mg by mouth daily.   folic acid 1 MG tablet Commonly known as: FOLVITE Take 1 tablet (1 mg total) by mouth daily.   furosemide 40 MG tablet Commonly known as: LASIX Take 40 mg by mouth daily.   gabapentin 300 MG capsule Commonly known as: NEURONTIN Take 1 capsule (300 mg total) by mouth at bedtime. 90 day supply   ipratropium-albuterol 0.5-2.5 (3) MG/3ML Soln Commonly known as: DUONEB INHALE 3 ML BY NEBULIZATION EVERY SIX (6) HOURS AS NEEDED.   latanoprost 0.005 % ophthalmic solution  Commonly known as: XALATAN Place 1 drop into both eyes at bedtime.   lidocaine-prilocaine cream Commonly known as: EMLA Apply 1 application topically as needed.   loratadine 10 MG tablet Commonly known as: CLARITIN OTC Take by mouth one a day for 10 days, then once a day as needed -allergies.   ondansetron 8 MG tablet Commonly known as: ZOFRAN Take 1 tablet (8 mg total) by mouth every 8 (eight) hours as needed for nausea or vomiting.   potassium chloride 10 MEQ tablet Commonly known as: KLOR-CON Take 10 mEq by mouth daily.   raloxifene 60 MG tablet Commonly known as: EVISTA Take 60 mg by mouth daily.   ranitidine 150 MG tablet Commonly  known as: ZANTAC 1 tablet by mouth twice a day for month and then take twice a day for needed - GERD   tiZANidine 4 MG capsule Commonly known as: ZANAFLEX Take 4 mg by mouth at bedtime.       Allergies:  Allergies  Allergen Reactions  . Lisinopril Swelling    Facial swelling     . Pregabalin Other (See Comments)    constipation  constipation constipation  . Timolol Swelling and Other (See Comments)    Past Medical History, Surgical history, Social history, and Family History were reviewed and updated.  Review of Systems: Review of Systems  Constitutional: Positive for malaise/fatigue.  HENT: Negative.   Eyes: Negative.   Respiratory: Negative.   Cardiovascular: Negative.   Gastrointestinal: Negative.   Genitourinary: Negative.   Musculoskeletal: Negative.   Skin: Negative.   Neurological: Negative.   Endo/Heme/Allergies: Negative.   Psychiatric/Behavioral: Negative.      Physical Exam:  weight is 164 lb (74.4 kg). Her temporal temperature is 97.1 F (36.2 C) (abnormal). Her blood pressure is 125/37 (abnormal) and her pulse is 85. Her respiration is 20 and oxygen saturation is 97%.   Wt Readings from Last 3 Encounters:  02/21/19 164 lb (74.4 kg)  01/30/19 160 lb 1.9 oz (72.6 kg)  01/09/19 165 lb (74.8 kg)    Physical Exam Vitals reviewed.  HENT:     Head: Normocephalic and atraumatic.  Eyes:     Pupils: Pupils are equal, round, and reactive to light.  Cardiovascular:     Rate and Rhythm: Normal rate and regular rhythm.     Heart sounds: Normal heart sounds.  Pulmonary:     Effort: Pulmonary effort is normal.     Breath sounds: Normal breath sounds.  Abdominal:     General: Bowel sounds are normal.     Palpations: Abdomen is soft.  Musculoskeletal:        General: No tenderness or deformity. Normal range of motion.     Cervical back: Normal range of motion.  Lymphadenopathy:     Cervical: No cervical adenopathy.  Skin:    General: Skin is warm  and dry.     Findings: No erythema or rash.  Neurological:     Mental Status: She is alert and oriented to person, place, and time.  Psychiatric:        Behavior: Behavior normal.        Thought Content: Thought content normal.        Judgment: Judgment normal.      Lab Results  Component Value Date   WBC 2.8 (L) 02/21/2019   HGB 7.2 (L) 02/21/2019   HCT 25.6 (L) 02/21/2019   MCV 111.8 (H) 02/21/2019   PLT 122 (L) 02/21/2019   Lab Results  Component Value Date   FERRITIN 5,259 (H) 01/09/2019   IRON 86 01/09/2019   TIBC 299 01/09/2019   UIBC 213 01/09/2019   IRONPCTSAT 29 01/09/2019   Lab Results  Component Value Date   RETICCTPCT 17.2 (H) 02/21/2019   RBC 2.29 (L) 02/21/2019   RBC 2.30 (L) 02/21/2019   RETICCTABS 59.2 11/13/2013   No results found for: Nils Pyle [facility] Lab Results  Component Value Date   IGGSERUM 557 (L) 06/19/2013   IGA 100 06/19/2013   IGMSERUM 95 06/19/2013   Lab Results  Component Value Date   TOTALPROTELP 6.3 09/16/2011   ALBUMINELP 59.9 09/16/2011   A1GS 7.3 (H) 09/16/2011   A2GS 9.9 09/16/2011   BETS 8.4 (H) 09/16/2011   BETA2SER 4.8 09/16/2011   GAMS 9.7 (L) 09/16/2011   MSPIKE NOT DET 09/16/2011   SPEI * 09/16/2011     Chemistry      Component Value Date/Time   NA 142 02/21/2019 0930   NA 140 09/16/2016 0945   K 3.9 02/21/2019 0930   K 3.2 (L) 09/16/2016 0945   CL 107 02/21/2019 0930   CL 101 12/08/2014 1138   CL 99 05/28/2012 1036   CO2 27 02/21/2019 0930   CO2 26 09/16/2016 0945   BUN 12 02/21/2019 0930   BUN 9.0 09/16/2016 0945   CREATININE 0.65 02/21/2019 0930   CREATININE 0.7 09/16/2016 0945      Component Value Date/Time   CALCIUM 8.9 02/21/2019 0930   CALCIUM 9.0 09/16/2016 0945   ALKPHOS 90 02/21/2019 0930   ALKPHOS 122 09/16/2016 0945   AST 18 02/21/2019 0930   AST 25 09/16/2016 0945   ALT 9 02/21/2019 0930   ALT 15 09/16/2016 0945   BILITOT 2.9 (H) 02/21/2019 0930   BILITOT 0.90  09/16/2016 0945       Impression and Plan: [name] is a very pleasant 85 yo African American female with a history of marginal zone lymphoma, angioedema and autoimmune hemolytic anemia.  Clearly, the problem still exists hemolytic anemia.  I just am not sure how we can try to slow it down.  I would have thought that the Rituxan would have done a good job with this.  I know we have not tried IVIG on her.  This also might be an option to think about.  Steroids also could be considered.  We definitely need to get her transfused.  I just wish we could do this today.  However, she is a very difficult crossmatch.  I think that we will transfuse her 2 units of blood early next week.  I am going to get an ultrasound of her spleen to see what is going on with the spleen.  1 option concern would be a splenectomy although she is 85 years old.  I know that some studies have looked at using bendamustine for autoimmune hemolytic anemia.  This is all about quality of life.  She realizes that she is 85 years old.  She certainly is aware that she may not be able to have anything else done.  She definitely is not afraid of going to heaven.  She has a very strong faith.  We are going to have to follow her very closely at this point.  I spent about 45 minutes with her today.  Again this is very complicated given the fact that this autoimmune hemolytic anemia just is much more resilient than I would have thought.  [name], MD 12/31/202010:44 AM

## 2019-02-21 NOTE — Patient Instructions (Signed)

## 2019-02-21 NOTE — Telephone Encounter (Signed)
Received call from [facility] in blood bank that pt is a difficult crossmatch. Will require 2-3 additional hours to match.  Dr Marin Olp aware.  Pt rescheduled for Monday, 1/4 for T&C with blood product to follow when available. dph

## 2019-02-22 LAB — HAPTOGLOBIN: Haptoglobin: 23 mg/dL — ABNORMAL LOW (ref 41–333)

## 2019-02-25 ENCOUNTER — Other Ambulatory Visit: Payer: Self-pay | Admitting: *Deleted

## 2019-02-25 ENCOUNTER — Inpatient Hospital Stay: Payer: Medicare Other

## 2019-02-25 ENCOUNTER — Other Ambulatory Visit: Payer: Self-pay | Admitting: Family

## 2019-02-25 ENCOUNTER — Telehealth: Payer: Self-pay | Admitting: *Deleted

## 2019-02-25 ENCOUNTER — Other Ambulatory Visit: Payer: Self-pay

## 2019-02-25 ENCOUNTER — Inpatient Hospital Stay: Payer: Medicare Other | Attending: Hematology & Oncology

## 2019-02-25 VITALS — BP 129/73 | HR 78 | Temp 97.8°F | Resp 20

## 2019-02-25 DIAGNOSIS — C8307 Small cell B-cell lymphoma, spleen: Secondary | ICD-10-CM

## 2019-02-25 DIAGNOSIS — Z79899 Other long term (current) drug therapy: Secondary | ICD-10-CM | POA: Insufficient documentation

## 2019-02-25 DIAGNOSIS — D589 Hereditary hemolytic anemia, unspecified: Secondary | ICD-10-CM

## 2019-02-25 DIAGNOSIS — R5383 Other fatigue: Secondary | ICD-10-CM | POA: Insufficient documentation

## 2019-02-25 DIAGNOSIS — D649 Anemia, unspecified: Secondary | ICD-10-CM

## 2019-02-25 DIAGNOSIS — D5911 Warm autoimmune hemolytic anemia: Secondary | ICD-10-CM | POA: Diagnosis present

## 2019-02-25 DIAGNOSIS — Z7982 Long term (current) use of aspirin: Secondary | ICD-10-CM | POA: Diagnosis not present

## 2019-02-25 DIAGNOSIS — Z7901 Long term (current) use of anticoagulants: Secondary | ICD-10-CM | POA: Insufficient documentation

## 2019-02-25 DIAGNOSIS — I10 Essential (primary) hypertension: Secondary | ICD-10-CM | POA: Insufficient documentation

## 2019-02-25 DIAGNOSIS — D500 Iron deficiency anemia secondary to blood loss (chronic): Secondary | ICD-10-CM | POA: Insufficient documentation

## 2019-02-25 DIAGNOSIS — E785 Hyperlipidemia, unspecified: Secondary | ICD-10-CM | POA: Diagnosis not present

## 2019-02-25 LAB — CBC (CANCER CENTER ONLY)
HCT: 26.5 % — ABNORMAL LOW (ref 36.0–46.0)
Hemoglobin: 7.6 g/dL — ABNORMAL LOW (ref 12.0–15.0)
MCH: 32.3 pg (ref 26.0–34.0)
MCHC: 28.7 g/dL — ABNORMAL LOW (ref 30.0–36.0)
MCV: 112.8 fL — ABNORMAL HIGH (ref 80.0–100.0)
Platelet Count: 135 10*3/uL — ABNORMAL LOW (ref 150–400)
RBC: 2.35 MIL/uL — ABNORMAL LOW (ref 3.87–5.11)
RDW: 17.2 % — ABNORMAL HIGH (ref 11.5–15.5)
WBC Count: 2.7 10*3/uL — ABNORMAL LOW (ref 4.0–10.5)
nRBC: 0 % (ref 0.0–0.2)

## 2019-02-25 LAB — PREPARE RBC (CROSSMATCH)

## 2019-02-25 LAB — SAMPLE TO BLOOD BANK

## 2019-02-25 LAB — COLD AGGLUTININ TITER: Cold Agglutinin Titer: 1:16 {titer}

## 2019-02-25 MED ORDER — SODIUM CHLORIDE 0.9% FLUSH
10.0000 mL | INTRAVENOUS | Status: DC | PRN
  Filled 2019-02-25: qty 10

## 2019-02-25 MED ORDER — HEPARIN SOD (PORK) LOCK FLUSH 100 UNIT/ML IV SOLN
500.0000 [IU] | Freq: Once | INTRAVENOUS | Status: DC | PRN
  Filled 2019-02-25: qty 5

## 2019-02-25 NOTE — Patient Instructions (Signed)

## 2019-02-25 NOTE — Telephone Encounter (Signed)
Patient here for labs.  Hgb 7.6  2 u blood ordered for Wednesday.  appt made.  Orders sent to blood bank

## 2019-02-26 ENCOUNTER — Telehealth: Payer: Self-pay | Admitting: Hematology & Oncology

## 2019-02-26 NOTE — Telephone Encounter (Signed)
Spoke with patient to confirm ultrasound appt tomorrow at 1130 am. Pt aware to fast for 6 hours per imaging instructions.

## 2019-02-27 ENCOUNTER — Ambulatory Visit (HOSPITAL_BASED_OUTPATIENT_CLINIC_OR_DEPARTMENT_OTHER)
Admission: RE | Admit: 2019-02-27 | Discharge: 2019-02-27 | Disposition: A | Discharge status: Home / Self Care | Payer: Medicare Other | Source: Ambulatory Visit | Attending: Hematology & Oncology | Admitting: Hematology & Oncology

## 2019-02-27 ENCOUNTER — Other Ambulatory Visit: Payer: Self-pay

## 2019-02-27 ENCOUNTER — Inpatient Hospital Stay: Payer: Medicare Other

## 2019-02-27 DIAGNOSIS — C8307 Small cell B-cell lymphoma, spleen: Secondary | ICD-10-CM | POA: Insufficient documentation

## 2019-02-27 DIAGNOSIS — D589 Hereditary hemolytic anemia, unspecified: Secondary | ICD-10-CM

## 2019-02-27 DIAGNOSIS — D649 Anemia, unspecified: Secondary | ICD-10-CM

## 2019-02-27 MED ORDER — SODIUM CHLORIDE 0.9% FLUSH
10.0000 mL | INTRAVENOUS | Status: AC | PRN
  Administered 2019-02-27: 10 mL
  Filled 2019-02-27: qty 10

## 2019-02-27 MED ORDER — SODIUM CHLORIDE 0.9% IV SOLUTION
250.0000 mL | Freq: Once | INTRAVENOUS | Status: AC
  Administered 2019-02-27: 250 mL via INTRAVENOUS
  Filled 2019-02-27: qty 250

## 2019-02-27 MED ORDER — ACETAMINOPHEN 325 MG PO TABS
650.0000 mg | ORAL_TABLET | Freq: Once | ORAL | Status: AC
  Administered 2019-02-27: 09:00:00 650 mg via ORAL

## 2019-02-27 MED ORDER — DIPHENHYDRAMINE HCL 25 MG PO CAPS
25.0000 mg | ORAL_CAPSULE | Freq: Once | ORAL | Status: AC
  Administered 2019-02-27: 09:00:00 25 mg via ORAL

## 2019-02-27 MED ORDER — HEPARIN SOD (PORK) LOCK FLUSH 100 UNIT/ML IV SOLN
250.0000 [IU] | INTRAVENOUS | Status: DC | PRN
  Filled 2019-02-27: qty 5

## 2019-02-27 MED ORDER — HEPARIN SOD (PORK) LOCK FLUSH 100 UNIT/ML IV SOLN
500.0000 [IU] | Freq: Every day | INTRAVENOUS | Status: AC | PRN
  Administered 2019-02-27: 14:00:00 500 [IU]
  Filled 2019-02-27: qty 5

## 2019-02-27 MED ORDER — SODIUM CHLORIDE 0.9% FLUSH
3.0000 mL | INTRAVENOUS | Status: DC | PRN
  Filled 2019-02-27: qty 10

## 2019-02-27 MED ORDER — ACETAMINOPHEN 325 MG PO TABS
ORAL_TABLET | ORAL | Status: AC
  Filled 2019-02-27: qty 2

## 2019-02-27 MED ORDER — DIPHENHYDRAMINE HCL 25 MG PO CAPS
ORAL_CAPSULE | ORAL | Status: AC
  Filled 2019-02-27: qty 2

## 2019-02-27 NOTE — Patient Instructions (Signed)

## 2019-02-28 ENCOUNTER — Telehealth: Payer: Self-pay | Admitting: *Deleted

## 2019-02-28 LAB — BPAM RBC
Blood Product Expiration Date: 202101232359
Blood Product Expiration Date: 202101242359
ISSUE DATE / TIME: 202101060803
ISSUE DATE / TIME: 202101060803
Unit Type and Rh: 6200
Unit Type and Rh: 6200

## 2019-02-28 LAB — TYPE AND SCREEN
ABO/RH(D): A POS
Antibody Screen: POSITIVE
DAT, IgG: POSITIVE
Unit division: 0
Unit division: 0

## 2019-02-28 NOTE — Telephone Encounter (Addendum)
-----   Message from [name], MD sent at 02/27/2019  5:04 PM EST ----- Called patient to let her know there is no enlarged spleen.

## 2019-03-06 ENCOUNTER — Other Ambulatory Visit: Payer: Self-pay | Admitting: Hematology & Oncology

## 2019-03-08 ENCOUNTER — Encounter: Payer: Self-pay | Admitting: *Deleted

## 2019-03-18 ENCOUNTER — Inpatient Hospital Stay (HOSPITAL_BASED_OUTPATIENT_CLINIC_OR_DEPARTMENT_OTHER): Payer: Medicare Other | Admitting: Hematology & Oncology

## 2019-03-18 ENCOUNTER — Inpatient Hospital Stay: Payer: Medicare Other

## 2019-03-18 ENCOUNTER — Telehealth: Payer: Self-pay | Admitting: Hematology & Oncology

## 2019-03-18 ENCOUNTER — Encounter: Payer: Self-pay | Admitting: Hematology & Oncology

## 2019-03-18 ENCOUNTER — Other Ambulatory Visit: Payer: Self-pay

## 2019-03-18 VITALS — BP 132/48 | HR 71 | Temp 97.1°F | Resp 18 | Wt 159.8 lb

## 2019-03-18 DIAGNOSIS — C8307 Small cell B-cell lymphoma, spleen: Secondary | ICD-10-CM

## 2019-03-18 DIAGNOSIS — Z95828 Presence of other vascular implants and grafts: Secondary | ICD-10-CM

## 2019-03-18 LAB — CBC WITH DIFFERENTIAL (CANCER CENTER ONLY)
Abs Immature Granulocytes: 0.03 10*3/uL (ref 0.00–0.07)
Basophils Absolute: 0 10*3/uL (ref 0.0–0.1)
Basophils Relative: 1 %
Eosinophils Absolute: 0.1 10*3/uL (ref 0.0–0.5)
Eosinophils Relative: 2 %
HCT: 31.8 % — ABNORMAL LOW (ref 36.0–46.0)
Hemoglobin: 9.6 g/dL — ABNORMAL LOW (ref 12.0–15.0)
Immature Granulocytes: 1 %
Lymphocytes Relative: 10 %
Lymphs Abs: 0.3 10*3/uL — ABNORMAL LOW (ref 0.7–4.0)
MCH: 31.5 pg (ref 26.0–34.0)
MCHC: 30.2 g/dL (ref 30.0–36.0)
MCV: 104.3 fL — ABNORMAL HIGH (ref 80.0–100.0)
Monocytes Absolute: 0.2 10*3/uL (ref 0.1–1.0)
Monocytes Relative: 5 %
Neutro Abs: 2.7 10*3/uL (ref 1.7–7.7)
Neutrophils Relative %: 81 %
Platelet Count: 122 10*3/uL — ABNORMAL LOW (ref 150–400)
RBC: 3.05 MIL/uL — ABNORMAL LOW (ref 3.87–5.11)
RDW: 13.2 % (ref 11.5–15.5)
WBC Count: 3.3 10*3/uL — ABNORMAL LOW (ref 4.0–10.5)
nRBC: 0 % (ref 0.0–0.2)

## 2019-03-18 LAB — CMP (CANCER CENTER ONLY)
ALT: 9 U/L (ref 0–44)
AST: 17 U/L (ref 15–41)
Albumin: 4.2 g/dL (ref 3.5–5.0)
Alkaline Phosphatase: 110 U/L (ref 38–126)
Anion gap: 7 (ref 5–15)
BUN: 11 mg/dL (ref 8–23)
CO2: 27 mmol/L (ref 22–32)
Calcium: 8.9 mg/dL (ref 8.9–10.3)
Chloride: 106 mmol/L (ref 98–111)
Creatinine: 0.59 mg/dL (ref 0.44–1.00)
GFR, Est AFR Am: >60 mL/min (ref >60)
GFR, Estimated: >60 mL/min (ref >60)
Glucose, Bld: 117 mg/dL — ABNORMAL HIGH (ref 70–99)
Potassium: 3.8 mmol/L (ref 3.5–5.1)
Sodium: 140 mmol/L (ref 135–145)
Total Bilirubin: 2.1 mg/dL — ABNORMAL HIGH (ref 0.3–1.2)
Total Protein: 6.1 g/dL — ABNORMAL LOW (ref 6.5–8.1)

## 2019-03-18 LAB — IRON AND TIBC
Iron: 95 ug/dL (ref 41–142)
Saturation Ratios: 32 % (ref 21–57)
TIBC: 296 ug/dL (ref 236–444)
UIBC: 201 ug/dL (ref 120–384)

## 2019-03-18 LAB — SAMPLE TO BLOOD BANK

## 2019-03-18 LAB — FERRITIN: Ferritin: 1443 ng/mL — ABNORMAL HIGH (ref 11–307)

## 2019-03-18 LAB — SAVE SMEAR(SSMR), FOR PROVIDER SLIDE REVIEW

## 2019-03-18 LAB — LACTATE DEHYDROGENASE: LDH: 287 U/L — ABNORMAL HIGH (ref 98–192)

## 2019-03-18 LAB — RETICULOCYTES
Immature Retic Fract: 28.8 % — ABNORMAL HIGH (ref 2.3–15.9)
RBC.: 3.04 MIL/uL — ABNORMAL LOW (ref 3.87–5.11)
Retic Count, Absolute: 275.1 10*3/uL — ABNORMAL HIGH (ref 19.0–186.0)
Retic Ct Pct: 9.1 % — ABNORMAL HIGH (ref 0.4–3.1)

## 2019-03-18 MED ORDER — SODIUM CHLORIDE 0.9% FLUSH
10.0000 mL | INTRAVENOUS | Status: DC | PRN
  Administered 2019-03-18: 11:00:00 10 mL via INTRAVENOUS
  Filled 2019-03-18: qty 10

## 2019-03-18 MED ORDER — HEPARIN SOD (PORK) LOCK FLUSH 100 UNIT/ML IV SOLN
500.0000 [IU] | Freq: Once | INTRAVENOUS | Status: AC
  Administered 2019-03-18: 500 [IU] via INTRAVENOUS
  Filled 2019-03-18: qty 5

## 2019-03-18 NOTE — Progress Notes (Signed)
Hematology and Oncology Follow Up Visit  Kristine Sullivan [id number] 10/22/1931 86 y.o. 03/18/2019   Principle Diagnosis:  Marginal zone lymphoma -- recurrent Iron deficiency anemia Autoimmune hemolytic anemia -- warm antibody  Past therapy: CVP -- completed 8 cycles on 04/09/2012 Maintenance Rituxan - 2 years completed in June 2016 IV iron - last dose on 06/2018  Current Therapy:   Rituxan/Cytoxan -- startedon 11/08/2018, s/p cycle 2, Cytoxan d/c'd 12/19/2018 IV Iron as needed   Interim History:  Kristine Sullivan is here today for follow-up.  Unfortunately, her hemolytic anemia is still very active.  Despite the Rituxan, we have really gained very little with respect to her hemolytic anemia.  Her hemoglobin keeps trending downward.  Today, her hemoglobin is 7.2.  I just am not sure why the Rituxan did not help with the hemolytic anemia.  I am going to have to do an ultrasound of her spleen to see if there is any splenomegaly.  We may have to ultimately consider a bone marrow biopsy to see if there is lymphoma in the marrow that might be triggering the hemolytic anemia.  She feels a little tired.  There is no evidence of heart failure which is a blessing.  She is eating okay.  She is having no nausea or vomiting.  There is no cough.  She has had no shortness of breath.  She has had no change in bowel or bladder habits.  There is been no leg swelling.  She has had no rashes.  Overall, I would say her performance status is ECOG 2.  Medications:  Allergies as of 03/18/2019      Reactions   Lisinopril Swelling   Facial swelling    Pregabalin Other (See Comments)   constipation constipation constipation   Timolol Swelling, Other (See Comments)      Medication List       Accurate as of March 18, 2019 11:49 AM. If you have any questions, ask your nurse or doctor.        alendronate 70 MG tablet Commonly known as: FOSAMAX Take by mouth once a week.   amLODipine  5 MG tablet Commonly known as: NORVASC Take 5 mg by mouth daily.   apixaban 5 MG Tabs tablet Commonly known as: ELIQUIS Take 5 mg by mouth 2 (two) times daily.   aspirin 81 MG tablet Take 81 mg by mouth daily.   atorvastatin 80 MG tablet Commonly known as: LIPITOR Take 40 mg by mouth daily.   Calcium 600-D 600-400 MG-UNIT Tabs Generic drug: Calcium Carbonate-Vitamin D3 Take by mouth.   diphenhydrAMINE 25 MG tablet Commonly known as: BENADRYL Take 25 mg by mouth at bedtime as needed.   diphenhydrAMINE 25 MG tablet Commonly known as: SOMINEX Take 25 mg by mouth daily.   dorzolamide 2 % ophthalmic solution Commonly known as: TRUSOPT Place 1 drop into both eyes 3 (three) times daily.   ferrous sulfate 325 (65 FE) MG tablet Take 325 mg by mouth daily.   folic acid 1 MG tablet Commonly known as: FOLVITE Take 1 tablet (1 mg total) by mouth daily.   furosemide 40 MG tablet Commonly known as: LASIX Take 40 mg by mouth daily.   gabapentin 300 MG capsule Commonly known as: NEURONTIN Take 1 capsule (300 mg total) by mouth at bedtime. 90 day supply   ipratropium-albuterol 0.5-2.5 (3) MG/3ML Soln Commonly known as: DUONEB INHALE 3 ML BY NEBULIZATION EVERY SIX (6) HOURS AS NEEDED.   latanoprost 0.005 % ophthalmic  solution Commonly known as: XALATAN Place 1 drop into both eyes at bedtime.   lidocaine-prilocaine cream Commonly known as: EMLA Apply 1 application topically as needed.   loratadine 10 MG tablet Commonly known as: CLARITIN OTC Take by mouth one a day for 10 days, then once a day as needed -allergies.   ondansetron 8 MG tablet Commonly known as: ZOFRAN Take 1 tablet (8 mg total) by mouth every 8 (eight) hours as needed for nausea or vomiting.   potassium chloride 10 MEQ tablet Commonly known as: KLOR-CON Take 10 mEq by mouth daily.   raloxifene 60 MG tablet Commonly known as: EVISTA Take 60 mg by mouth daily.   ranitidine 150 MG tablet Commonly  known as: ZANTAC 1 tablet by mouth twice a day for month and then take twice a day for needed - GERD   tiZANidine 4 MG capsule Commonly known as: ZANAFLEX Take 4 mg by mouth at bedtime.       Allergies:  Allergies  Allergen Reactions  . Lisinopril Swelling    Facial swelling     . Pregabalin Other (See Comments)    constipation  constipation constipation  . Timolol Swelling and Other (See Comments)    Past Medical History, Surgical history, Social history, and Family History were reviewed and updated.  Review of Systems: Review of Systems  Constitutional: Positive for malaise/fatigue.  HENT: Negative.   Eyes: Negative.   Respiratory: Negative.   Cardiovascular: Negative.   Gastrointestinal: Negative.   Genitourinary: Negative.   Musculoskeletal: Negative.   Skin: Negative.   Neurological: Negative.   Endo/Heme/Allergies: Negative.   Psychiatric/Behavioral: Negative.      Physical Exam:  weight is 159 lb 12 oz (72.5 kg). Her temporal temperature is 97.1 F (36.2 C) (abnormal). Her blood pressure is 132/48 (abnormal) and her pulse is 71. Her respiration is 18 and oxygen saturation is 95%.   Wt Readings from Last 3 Encounters:  03/18/19 159 lb 12 oz (72.5 kg)  02/21/19 164 lb (74.4 kg)  01/30/19 160 lb 1.9 oz (72.6 kg)    Physical Exam Vitals reviewed.  HENT:     Head: Normocephalic and atraumatic.  Eyes:     Pupils: Pupils are equal, round, and reactive to light.  Cardiovascular:     Rate and Rhythm: Normal rate and regular rhythm.     Heart sounds: Normal heart sounds.  Pulmonary:     Effort: Pulmonary effort is normal.     Breath sounds: Normal breath sounds.  Abdominal:     General: Bowel sounds are normal.     Palpations: Abdomen is soft.  Musculoskeletal:        General: No tenderness or deformity. Normal range of motion.     Cervical back: Normal range of motion.  Lymphadenopathy:     Cervical: No cervical adenopathy.  Skin:    General:  Skin is warm and dry.     Findings: No erythema or rash.  Neurological:     Mental Status: She is alert and oriented to person, place, and time.  Psychiatric:        Behavior: Behavior normal.        Thought Content: Thought content normal.        Judgment: Judgment normal.      Lab Results  Component Value Date   WBC 3.3 (L) 03/18/2019   HGB 9.6 (L) 03/18/2019   HCT 31.8 (L) 03/18/2019   MCV 104.3 (H) 03/18/2019   PLT 122 (L) 03/18/2019  Lab Results  Component Value Date   FERRITIN 1,122 (H) 02/21/2019   IRON 102 02/21/2019   TIBC 295 02/21/2019   UIBC 193 02/21/2019   IRONPCTSAT 35 02/21/2019   Lab Results  Component Value Date   RETICCTPCT 9.1 (H) 03/18/2019   RBC 3.04 (L) 03/18/2019   RETICCTABS 59.2 11/13/2013   No results found for: [facility] Lab Results  Component Value Date   IGGSERUM 557 (L) 06/19/2013   IGA 100 06/19/2013   IGMSERUM 95 06/19/2013   Lab Results  Component Value Date   TOTALPROTELP 6.3 09/16/2011   ALBUMINELP 59.9 09/16/2011   A1GS 7.3 (H) 09/16/2011   A2GS 9.9 09/16/2011   BETS 8.4 (H) 09/16/2011   BETA2SER 4.8 09/16/2011   GAMS 9.7 (L) 09/16/2011   MSPIKE NOT DET 09/16/2011   SPEI * 09/16/2011     Chemistry      Component Value Date/Time   NA 140 03/18/2019 1041   NA 140 09/16/2016 0945   K 3.8 03/18/2019 1041   K 3.2 (L) 09/16/2016 0945   CL 106 03/18/2019 1041   CL 101 12/08/2014 1138   CL 99 05/28/2012 1036   CO2 27 03/18/2019 1041   CO2 26 09/16/2016 0945   BUN 11 03/18/2019 1041   BUN 9.0 09/16/2016 0945   CREATININE 0.59 03/18/2019 1041   CREATININE 0.7 09/16/2016 0945      Component Value Date/Time   CALCIUM 8.9 03/18/2019 1041   CALCIUM 9.0 09/16/2016 0945   ALKPHOS 110 03/18/2019 1041   ALKPHOS 122 09/16/2016 0945   AST 17 03/18/2019 1041   AST 25 09/16/2016 0945   ALT 9 03/18/2019 1041   ALT 15 09/16/2016 0945   BILITOT 2.1 (H) 03/18/2019 1041   BILITOT 0.90 09/16/2016  0945       Impression and Plan: Ms. Cortese is a very pleasant 86 yo African American female with a history of marginal zone lymphoma, angioedema and autoimmune hemolytic anemia.  Clearly, the problem still exists hemolytic anemia.  I just am not sure how we can try to slow it down.  I would have thought that the Rituxan would have done a good job with this.  I know we have not tried IVIG on her.  This also might be an option to think about.  Steroids also could be considered.  We definitely need to get her transfused.  I just wish we could do this today.  However, she is a very difficult crossmatch.  I think that we will transfuse her 2 units of blood early next week.  I am going to get an ultrasound of her spleen to see what is going on with the spleen.  1 option concern would be a splenectomy although she is 86 years old.  I know that some studies have looked at using bendamustine for autoimmune hemolytic anemia.  This is all about quality of life.  She realizes that she is 86 years old.  She certainly is aware that she may not be able to have anything else done.  She definitely is not afraid of going to heaven.  She has a very strong faith.  We are going to have to follow her very closely at this point.  I spent about 45 minutes with her today.  Again this is very complicated given the fact that this autoimmune hemolytic anemia just is much more resilient than I would have thought.  [name], MD 1/25/202111:49 AM Hematology and Oncology Follow  Up Visit  Kristine Sullivan [id number] 09/09/1931 86 y.o. 03/18/2019   Principle Diagnosis:  Marginal zone lymphoma -- recurrent Iron deficiency anemia Autoimmune hemolytic anemia -- warm antibody  Past therapy: CVP -- completed 8 cycles on 04/09/2012 Maintenance Rituxan - 2 years completed in June 2016 IV iron - last dose on 06/2018  Current Therapy:   Rituxan/Cytoxan -- startedon 11/08/2018, s/p cycle 4, Cytoxan  d/c'd 12/19/2018 IV Iron as needed   Interim History:  Ms. Wilfong is here today for follow-up.  Overall, she is doing pretty well.  She had a good weekend.  She did not feel too tired.  She does have a little bit of swelling in the legs.  Hopefully, we are starting to see some improvement in the hemolysis.   The reticulocyte count is going down a little bit.  Hopefully, the Rituxan is starting to work a little bit better.  She is not had any bleeding.  She has had no fever.  She has had no nausea or vomiting.  We did a ultrasound of her abdomen back on January 6.  This showed no splenomegaly.  There is no lymphadenopathy.  Currently, I would say performance status is ECOG 1-2.    Medications:  Allergies as of 03/18/2019      Reactions   Lisinopril Swelling   Facial swelling    Pregabalin Other (See Comments)   constipation constipation constipation   Timolol Swelling, Other (See Comments)      Medication List       Accurate as of March 18, 2019 10:49 AM. If you have any questions, ask your nurse or doctor.        alendronate 70 MG tablet Commonly known as: FOSAMAX Take by mouth once a week.   amLODipine 5 MG tablet Commonly known as: NORVASC Take 5 mg by mouth daily.   apixaban 5 MG Tabs tablet Commonly known as: ELIQUIS Take 5 mg by mouth 2 (two) times daily.   aspirin 81 MG tablet Take 81 mg by mouth daily.   atorvastatin 80 MG tablet Commonly known as: LIPITOR Take 40 mg by mouth daily.   Calcium 600-D 600-400 MG-UNIT Tabs Generic drug: Calcium Carbonate-Vitamin D3 Take by mouth.   diphenhydrAMINE 25 MG tablet Commonly known as: BENADRYL Take 25 mg by mouth at bedtime as needed.   diphenhydrAMINE 25 MG tablet Commonly known as: SOMINEX Take 25 mg by mouth daily.   dorzolamide 2 % ophthalmic solution Commonly known as: TRUSOPT Place 1 drop into both eyes 3 (three) times daily.   ferrous sulfate 325 (65 FE) MG tablet Take 325 mg by mouth  daily.   folic acid 1 MG tablet Commonly known as: FOLVITE Take 1 tablet (1 mg total) by mouth daily.   furosemide 40 MG tablet Commonly known as: LASIX Take 40 mg by mouth daily.   gabapentin 300 MG capsule Commonly known as: NEURONTIN Take 1 capsule (300 mg total) by mouth at bedtime. 90 day supply   ipratropium-albuterol 0.5-2.5 (3) MG/3ML Soln Commonly known as: DUONEB INHALE 3 ML BY NEBULIZATION EVERY SIX (6) HOURS AS NEEDED.   latanoprost 0.005 % ophthalmic solution Commonly known as: XALATAN Place 1 drop into both eyes at bedtime.   lidocaine-prilocaine cream Commonly known as: EMLA Apply 1 application topically as needed.   loratadine 10 MG tablet Commonly known as: CLARITIN OTC Take by mouth one a day for 10 days, then once a day as needed -allergies.   ondansetron 8 MG tablet Commonly  known as: ZOFRAN Take 1 tablet (8 mg total) by mouth every 8 (eight) hours as needed for nausea or vomiting.   potassium chloride 10 MEQ tablet Commonly known as: KLOR-CON Take 10 mEq by mouth daily.   raloxifene 60 MG tablet Commonly known as: EVISTA Take 60 mg by mouth daily.   ranitidine 150 MG tablet Commonly known as: ZANTAC 1 tablet by mouth twice a day for month and then take twice a day for needed - GERD   tiZANidine 4 MG capsule Commonly known as: ZANAFLEX Take 4 mg by mouth at bedtime.       Allergies:  Allergies  Allergen Reactions  . Lisinopril Swelling    Facial swelling     . Pregabalin Other (See Comments)    constipation  constipation constipation  . Timolol Swelling and Other (See Comments)    Past Medical History, Surgical history, Social history, and Family History were reviewed and updated.  Review of Systems: Review of Systems  Constitutional: Positive for malaise/fatigue.  HENT: Negative.   Eyes: Negative.   Respiratory: Negative.   Cardiovascular: Negative.   Gastrointestinal: Negative.   Genitourinary: Negative.    Musculoskeletal: Negative.   Skin: Negative.   Neurological: Negative.   Endo/Heme/Allergies: Negative.   Psychiatric/Behavioral: Negative.      Physical Exam:  vitals were not taken for this visit.   Wt Readings from Last 3 Encounters:  02/21/19 164 lb (74.4 kg)  01/30/19 160 lb 1.9 oz (72.6 kg)  01/09/19 165 lb (74.8 kg)    Physical Exam Vitals reviewed.  HENT:     Head: Normocephalic and atraumatic.  Eyes:     Pupils: Pupils are equal, round, and reactive to light.  Cardiovascular:     Rate and Rhythm: Normal rate and regular rhythm.     Heart sounds: Normal heart sounds.  Pulmonary:     Effort: Pulmonary effort is normal.     Breath sounds: Normal breath sounds.  Abdominal:     General: Bowel sounds are normal.     Palpations: Abdomen is soft.  Musculoskeletal:        General: No tenderness or deformity. Normal range of motion.     Cervical back: Normal range of motion.  Lymphadenopathy:     Cervical: No cervical adenopathy.  Skin:    General: Skin is warm and dry.     Findings: No erythema or rash.  Neurological:     Mental Status: She is alert and oriented to person, place, and time.  Psychiatric:        Behavior: Behavior normal.        Thought Content: Thought content normal.        Judgment: Judgment normal.      Lab Results  Component Value Date   WBC 3.3 (L) 03/18/2019   HGB 9.6 (L) 03/18/2019   HCT 31.8 (L) 03/18/2019   MCV 104.3 (H) 03/18/2019   PLT 122 (L) 03/18/2019   Lab Results  Component Value Date   FERRITIN 1,122 (H) 02/21/2019   IRON 102 02/21/2019   TIBC 295 02/21/2019   UIBC 193 02/21/2019   IRONPCTSAT 35 02/21/2019   Lab Results  Component Value Date   RETICCTPCT 9.1 (H) 03/18/2019   RBC 3.04 (L) 03/18/2019   RETICCTABS 59.2 11/13/2013   No results found for: KPAFRELGTCHN, LAMBDASER, KAPLAMBRATIO Lab Results  Component Value Date   IGGSERUM 557 (L) 06/19/2013   IGA 100 06/19/2013   IGMSERUM 95 06/19/2013   Lab  Results  Component Value Date   TOTALPROTELP 6.3 09/16/2011   ALBUMINELP 59.9 09/16/2011   A1GS 7.3 (H) 09/16/2011   A2GS 9.9 09/16/2011   BETS 8.4 (H) 09/16/2011   BETA2SER 4.8 09/16/2011   GAMS 9.7 (L) 09/16/2011   MSPIKE NOT DET 09/16/2011   SPEI * 09/16/2011     Chemistry      Component Value Date/Time   NA 142 02/21/2019 0930   NA 140 09/16/2016 0945   K 3.9 02/21/2019 0930   K 3.2 (L) 09/16/2016 0945   CL 107 02/21/2019 0930   CL 101 12/08/2014 1138   CL 99 05/28/2012 1036   CO2 27 02/21/2019 0930   CO2 26 09/16/2016 0945   BUN 12 02/21/2019 0930   BUN 9.0 09/16/2016 0945   CREATININE 0.65 02/21/2019 0930   CREATININE 0.7 09/16/2016 0945      Component Value Date/Time   CALCIUM 8.9 02/21/2019 0930   CALCIUM 9.0 09/16/2016 0945   ALKPHOS 90 02/21/2019 0930   ALKPHOS 122 09/16/2016 0945   AST 18 02/21/2019 0930   AST 25 09/16/2016 0945   ALT 9 02/21/2019 0930   ALT 15 09/16/2016 0945   BILITOT 2.9 (H) 02/21/2019 0930   BILITOT 0.90 09/16/2016 0945       Impression and Plan: Ms. Larocque is a very pleasant 86 yo African American female with a history of marginal zone lymphoma, angioedema and autoimmune hemolytic anemia.  We need to check her blood counts weekly.  I realize this is a little bit of a inconvenience.  However, I really need to see which way her blood counts are headed.  If she starts to have an exacerbation of the hemolysis, I will have to think about using Gazyva/bendamustine.  I think this might not be a bad idea.  I also thought about maybe getting her spleen out.  This also might be a reasonable option although she is 86 years old.  We still have a ways to go before we really know which "path" we are headed down.  I will plan to see her back in another 3 weeks.    [name], MD 1/25/202110:49 AM

## 2019-03-18 NOTE — Telephone Encounter (Signed)
Appointments scheduled calendar printed per 1/25 los 

## 2019-03-18 NOTE — Patient Instructions (Signed)
Implanted Port Insertion, Care After °This sheet gives you information about how to care for yourself after your procedure. Your health care provider may also give you more specific instructions. If you have problems or questions, contact your health care provider. °What can I expect after the procedure? °After the procedure, it is common to have: °· Discomfort at the port insertion site. °· Bruising on the skin over the port. This should improve over 3-4 days. °Follow these instructions at home: °Port care °· After your port is placed, you will get a manufacturer's information card. The card has information about your port. Keep this card with you at all times. °· Take care of the port as told by your health care provider. Ask your health care provider if you or a family member can get training for taking care of the port at home. A home health care nurse may also take care of the port. °· Make sure to remember what type of port you have. °Incision care ° °  ° °· Follow instructions from your health care provider about how to take care of your port insertion site. Make sure you: °? Wash your hands with soap and water before and after you change your bandage (dressing). If soap and water are not available, use hand sanitizer. °? Change your dressing as told by your health care provider. °? Leave stitches (sutures), skin glue, or adhesive strips in place. These skin closures may need to stay in place for 2 weeks or longer. If adhesive strip edges start to loosen and curl up, you may trim the loose edges. Do not remove adhesive strips completely unless your health care provider tells you to do that. °· Check your port insertion site every day for signs of infection. Check for: °? Redness, swelling, or pain. °? Fluid or blood. °? Warmth. °? Pus or a bad smell. °Activity °· Return to your normal activities as told by your health care provider. Ask your health care provider what activities are safe for you. °· Do not  lift anything that is heavier than 10 lb (4.5 kg), or the limit that you are told, until your health care provider says that it is safe. °General instructions °· Take over-the-counter and prescription medicines only as told by your health care provider. °· Do not take baths, swim, or use a hot tub until your health care provider approves. Ask your health care provider if you may take showers. You may only be allowed to take sponge baths. °· Do not drive for 24 hours if you were given a sedative during your procedure. °· Wear a medical alert bracelet in case of an emergency. This will tell any health care providers that you have a port. °· Keep all follow-up visits as told by your health care provider. This is important. °Contact a health care provider if: °· You cannot flush your port with saline as directed, or you cannot draw blood from the port. °· You have a fever or chills. °· You have redness, swelling, or pain around your port insertion site. °· You have fluid or blood coming from your port insertion site. °· Your port insertion site feels warm to the touch. °· You have pus or a bad smell coming from the port insertion site. °Get help right away if: °· You have chest pain or shortness of breath. °· You have bleeding from your port that you cannot control. °Summary °· Take care of the port as told by your health   care provider. Keep the manufacturer's information card with you at all times. °· Change your dressing as told by your health care provider. °· Contact a health care provider if you have a fever or chills or if you have redness, swelling, or pain around your port insertion site. °· Keep all follow-up visits as told by your health care provider. °This information is not intended to replace advice given to you by your health care provider. Make sure you discuss any questions you have with your health care provider. °Document Revised: 09/05/2017 Document Reviewed: 09/05/2017 °Elsevier Patient Education ©  2020 [employer] ° °

## 2019-03-25 ENCOUNTER — Inpatient Hospital Stay: Payer: Medicare Other | Attending: Hematology & Oncology

## 2019-03-25 DIAGNOSIS — Z452 Encounter for adjustment and management of vascular access device: Secondary | ICD-10-CM | POA: Insufficient documentation

## 2019-03-25 DIAGNOSIS — E785 Hyperlipidemia, unspecified: Secondary | ICD-10-CM | POA: Insufficient documentation

## 2019-03-25 DIAGNOSIS — D509 Iron deficiency anemia, unspecified: Secondary | ICD-10-CM | POA: Insufficient documentation

## 2019-03-25 DIAGNOSIS — Z7982 Long term (current) use of aspirin: Secondary | ICD-10-CM | POA: Insufficient documentation

## 2019-03-25 DIAGNOSIS — Z79899 Other long term (current) drug therapy: Secondary | ICD-10-CM | POA: Insufficient documentation

## 2019-03-25 DIAGNOSIS — R0602 Shortness of breath: Secondary | ICD-10-CM | POA: Insufficient documentation

## 2019-03-25 DIAGNOSIS — Z5112 Encounter for antineoplastic immunotherapy: Secondary | ICD-10-CM | POA: Insufficient documentation

## 2019-03-25 DIAGNOSIS — R5383 Other fatigue: Secondary | ICD-10-CM | POA: Insufficient documentation

## 2019-03-25 DIAGNOSIS — Z7901 Long term (current) use of anticoagulants: Secondary | ICD-10-CM | POA: Insufficient documentation

## 2019-03-25 DIAGNOSIS — I10 Essential (primary) hypertension: Secondary | ICD-10-CM | POA: Insufficient documentation

## 2019-03-25 DIAGNOSIS — D5911 Warm autoimmune hemolytic anemia: Secondary | ICD-10-CM | POA: Insufficient documentation

## 2019-03-25 DIAGNOSIS — C8307 Small cell B-cell lymphoma, spleen: Secondary | ICD-10-CM | POA: Insufficient documentation

## 2019-03-25 DIAGNOSIS — Z5111 Encounter for antineoplastic chemotherapy: Secondary | ICD-10-CM | POA: Insufficient documentation

## 2019-03-29 ENCOUNTER — Other Ambulatory Visit: Payer: Self-pay | Admitting: *Deleted

## 2019-03-29 DIAGNOSIS — D500 Iron deficiency anemia secondary to blood loss (chronic): Secondary | ICD-10-CM

## 2019-03-29 DIAGNOSIS — D649 Anemia, unspecified: Secondary | ICD-10-CM

## 2019-04-01 ENCOUNTER — Inpatient Hospital Stay: Payer: Medicare Other

## 2019-04-01 ENCOUNTER — Other Ambulatory Visit: Payer: Self-pay | Admitting: *Deleted

## 2019-04-01 ENCOUNTER — Encounter (INDEPENDENT_AMBULATORY_CARE_PROVIDER_SITE_OTHER): Payer: Self-pay

## 2019-04-01 ENCOUNTER — Other Ambulatory Visit: Payer: Self-pay

## 2019-04-01 DIAGNOSIS — C8307 Small cell B-cell lymphoma, spleen: Secondary | ICD-10-CM | POA: Diagnosis not present

## 2019-04-01 DIAGNOSIS — D500 Iron deficiency anemia secondary to blood loss (chronic): Secondary | ICD-10-CM

## 2019-04-01 DIAGNOSIS — R5383 Other fatigue: Secondary | ICD-10-CM | POA: Diagnosis not present

## 2019-04-01 DIAGNOSIS — Z7901 Long term (current) use of anticoagulants: Secondary | ICD-10-CM | POA: Diagnosis not present

## 2019-04-01 DIAGNOSIS — R0602 Shortness of breath: Secondary | ICD-10-CM | POA: Diagnosis not present

## 2019-04-01 DIAGNOSIS — Z5111 Encounter for antineoplastic chemotherapy: Secondary | ICD-10-CM | POA: Diagnosis present

## 2019-04-01 DIAGNOSIS — D649 Anemia, unspecified: Secondary | ICD-10-CM

## 2019-04-01 DIAGNOSIS — E785 Hyperlipidemia, unspecified: Secondary | ICD-10-CM | POA: Diagnosis not present

## 2019-04-01 DIAGNOSIS — Z79899 Other long term (current) drug therapy: Secondary | ICD-10-CM | POA: Diagnosis not present

## 2019-04-01 DIAGNOSIS — I10 Essential (primary) hypertension: Secondary | ICD-10-CM | POA: Diagnosis not present

## 2019-04-01 DIAGNOSIS — Z7982 Long term (current) use of aspirin: Secondary | ICD-10-CM | POA: Diagnosis not present

## 2019-04-01 DIAGNOSIS — D5911 Warm autoimmune hemolytic anemia: Secondary | ICD-10-CM | POA: Diagnosis not present

## 2019-04-01 DIAGNOSIS — D509 Iron deficiency anemia, unspecified: Secondary | ICD-10-CM | POA: Diagnosis not present

## 2019-04-01 DIAGNOSIS — Z5112 Encounter for antineoplastic immunotherapy: Secondary | ICD-10-CM | POA: Diagnosis present

## 2019-04-01 DIAGNOSIS — Z452 Encounter for adjustment and management of vascular access device: Secondary | ICD-10-CM | POA: Diagnosis present

## 2019-04-01 LAB — CBC WITH DIFFERENTIAL (CANCER CENTER ONLY)
Abs Immature Granulocytes: 0.05 10*3/uL (ref 0.00–0.07)
Basophils Absolute: 0 10*3/uL (ref 0.0–0.1)
Basophils Relative: 0 %
Eosinophils Absolute: 0 10*3/uL (ref 0.0–0.5)
Eosinophils Relative: 1 %
HCT: 29.6 % — ABNORMAL LOW (ref 36.0–46.0)
Hemoglobin: 8.8 g/dL — ABNORMAL LOW (ref 12.0–15.0)
Immature Granulocytes: 1 %
Lymphocytes Relative: 8 %
Lymphs Abs: 0.3 10*3/uL — ABNORMAL LOW (ref 0.7–4.0)
MCH: 31.5 pg (ref 26.0–34.0)
MCHC: 29.7 g/dL — ABNORMAL LOW (ref 30.0–36.0)
MCV: 106.1 fL — ABNORMAL HIGH (ref 80.0–100.0)
Monocytes Absolute: 0.2 10*3/uL (ref 0.1–1.0)
Monocytes Relative: 5 %
Neutro Abs: 3.7 10*3/uL (ref 1.7–7.7)
Neutrophils Relative %: 85 %
Platelet Count: 117 10*3/uL — ABNORMAL LOW (ref 150–400)
RBC: 2.79 MIL/uL — ABNORMAL LOW (ref 3.87–5.11)
RDW: 14.1 % (ref 11.5–15.5)
WBC Count: 4.4 10*3/uL (ref 4.0–10.5)
nRBC: 0 % (ref 0.0–0.2)

## 2019-04-01 LAB — RETICULOCYTES
Immature Retic Fract: 25.9 % — ABNORMAL HIGH (ref 2.3–15.9)
RBC.: 2.75 MIL/uL — ABNORMAL LOW (ref 3.87–5.11)
Retic Count, Absolute: 298.9 10*3/uL — ABNORMAL HIGH (ref 19.0–186.0)
Retic Ct Pct: 10.9 % — ABNORMAL HIGH (ref 0.4–3.1)

## 2019-04-01 LAB — SAMPLE TO BLOOD BANK

## 2019-04-01 MED ORDER — SODIUM CHLORIDE 0.9% FLUSH
10.0000 mL | INTRAVENOUS | Status: DC | PRN
  Administered 2019-04-01: 10 mL via INTRAVENOUS
  Filled 2019-04-01: qty 10

## 2019-04-01 MED ORDER — HEPARIN SOD (PORK) LOCK FLUSH 100 UNIT/ML IV SOLN
500.0000 [IU] | Freq: Once | INTRAVENOUS | Status: AC
  Administered 2019-04-01: 500 [IU] via INTRAVENOUS
  Filled 2019-04-01: qty 5

## 2019-04-01 NOTE — Patient Instructions (Signed)

## 2019-04-08 ENCOUNTER — Other Ambulatory Visit: Payer: Self-pay | Admitting: *Deleted

## 2019-04-08 ENCOUNTER — Inpatient Hospital Stay: Payer: Medicare Other

## 2019-04-08 ENCOUNTER — Inpatient Hospital Stay (HOSPITAL_BASED_OUTPATIENT_CLINIC_OR_DEPARTMENT_OTHER): Payer: Medicare Other | Admitting: Hematology & Oncology

## 2019-04-08 ENCOUNTER — Encounter: Payer: Self-pay | Admitting: Hematology & Oncology

## 2019-04-08 ENCOUNTER — Other Ambulatory Visit: Payer: Self-pay

## 2019-04-08 VITALS — BP 126/54 | HR 80 | Temp 97.7°F | Resp 16 | Wt 157.0 lb

## 2019-04-08 DIAGNOSIS — D5911 Warm autoimmune hemolytic anemia: Secondary | ICD-10-CM | POA: Diagnosis not present

## 2019-04-08 DIAGNOSIS — Z5111 Encounter for antineoplastic chemotherapy: Secondary | ICD-10-CM | POA: Diagnosis not present

## 2019-04-08 DIAGNOSIS — D500 Iron deficiency anemia secondary to blood loss (chronic): Secondary | ICD-10-CM

## 2019-04-08 DIAGNOSIS — D589 Hereditary hemolytic anemia, unspecified: Secondary | ICD-10-CM

## 2019-04-08 DIAGNOSIS — C8307 Small cell B-cell lymphoma, spleen: Secondary | ICD-10-CM | POA: Diagnosis not present

## 2019-04-08 HISTORY — DX: Warm autoimmune hemolytic anemia: D59.11

## 2019-04-08 LAB — CBC WITH DIFFERENTIAL (CANCER CENTER ONLY)
Abs Immature Granulocytes: 0.1 10*3/uL — ABNORMAL HIGH (ref 0.00–0.07)
Basophils Absolute: 0 10*3/uL (ref 0.0–0.1)
Basophils Relative: 0 %
Eosinophils Absolute: 0.1 10*3/uL (ref 0.0–0.5)
Eosinophils Relative: 1 %
HCT: 28.1 % — ABNORMAL LOW (ref 36.0–46.0)
Hemoglobin: 8.3 g/dL — ABNORMAL LOW (ref 12.0–15.0)
Immature Granulocytes: 3 %
Lymphocytes Relative: 13 %
Lymphs Abs: 0.5 10*3/uL — ABNORMAL LOW (ref 0.7–4.0)
MCH: 31.6 pg (ref 26.0–34.0)
MCHC: 29.5 g/dL — ABNORMAL LOW (ref 30.0–36.0)
MCV: 106.8 fL — ABNORMAL HIGH (ref 80.0–100.0)
Monocytes Absolute: 0.2 10*3/uL (ref 0.1–1.0)
Monocytes Relative: 5 %
Neutro Abs: 3.1 10*3/uL (ref 1.7–7.7)
Neutrophils Relative %: 78 %
Platelet Count: 129 10*3/uL — ABNORMAL LOW (ref 150–400)
RBC: 2.63 MIL/uL — ABNORMAL LOW (ref 3.87–5.11)
RDW: 15 % (ref 11.5–15.5)
WBC Count: 3.9 10*3/uL — ABNORMAL LOW (ref 4.0–10.5)
nRBC: 0 % (ref 0.0–0.2)

## 2019-04-08 LAB — RETICULOCYTES
Immature Retic Fract: 33.6 % — ABNORMAL HIGH (ref 2.3–15.9)
RBC.: 2.62 MIL/uL — ABNORMAL LOW (ref 3.87–5.11)
Retic Count, Absolute: 329.1 10*3/uL — ABNORMAL HIGH (ref 19.0–186.0)
Retic Ct Pct: 12.6 % — ABNORMAL HIGH (ref 0.4–3.1)

## 2019-04-08 MED ORDER — SODIUM CHLORIDE 0.9% FLUSH
10.0000 mL | INTRAVENOUS | Status: DC | PRN
  Administered 2019-04-08: 14:00:00 10 mL via INTRAVENOUS
  Filled 2019-04-08: qty 10

## 2019-04-08 MED ORDER — HEPARIN SOD (PORK) LOCK FLUSH 100 UNIT/ML IV SOLN
500.0000 [IU] | Freq: Once | INTRAVENOUS | Status: AC | PRN
  Administered 2019-04-08: 500 [IU] via INTRAVENOUS
  Filled 2019-04-08: qty 5
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
## 2019-04-08 NOTE — Progress Notes (Signed)
DISCONTINUE ON PATHWAY REGIMEN - Lymphoma and CLL     Administer weekly:     Rituximab-xxxx   **Always confirm dose/schedule in your pharmacy ordering system**  REASON: Disease Progression PRIOR TREATMENT: LYOS201: Rituximab 375 mg/m2 IV Weekly x 4 Weeks TREATMENT RESPONSE: Partial Response (PR)  START ON PATHWAY REGIMEN - Lymphoma and CLL     A cycle is every 28 days:     Bendamustine      Rituximab-xxxx   **Always confirm dose/schedule in your pharmacy ordering system**  Patient Characteristics: Marginal Zone Lymphoma, Systemic, Second Line Disease Type: Marginal Zone Lymphoma Disease Type: Not Applicable Disease Type: Not Applicable Localized or Systemic Disease<= Systemic [place] Stage: IV Line of Therapy: Second Line Intent of Therapy: Non-Curative / Palliative Intent, Discussed with Patient

## 2019-04-08 NOTE — Patient Instructions (Signed)

## 2019-04-10 ENCOUNTER — Inpatient Hospital Stay: Payer: Medicare Other

## 2019-04-10 ENCOUNTER — Other Ambulatory Visit: Payer: Self-pay

## 2019-04-10 VITALS — BP 128/49 | HR 79 | Temp 97.1°F | Resp 20

## 2019-04-10 DIAGNOSIS — Z5111 Encounter for antineoplastic chemotherapy: Secondary | ICD-10-CM | POA: Diagnosis not present

## 2019-04-10 DIAGNOSIS — D500 Iron deficiency anemia secondary to blood loss (chronic): Secondary | ICD-10-CM

## 2019-04-10 MED ORDER — ACETAMINOPHEN 325 MG PO TABS
ORAL_TABLET | ORAL | Status: AC
  Filled 2019-04-10: qty 4

## 2019-04-10 MED ORDER — ACETAMINOPHEN 325 MG PO TABS
650.0000 mg | ORAL_TABLET | Freq: Once | ORAL | Status: AC
  Administered 2019-04-10: 650 mg via ORAL

## 2019-04-10 MED ORDER — SODIUM CHLORIDE 0.9% FLUSH
10.0000 mL | INTRAVENOUS | Status: AC | PRN
  Administered 2019-04-10: 10 mL
  Filled 2019-04-10: qty 10

## 2019-04-10 MED ORDER — HEPARIN SOD (PORK) LOCK FLUSH 100 UNIT/ML IV SOLN
500.0000 [IU] | Freq: Every day | INTRAVENOUS | Status: AC | PRN
  Administered 2019-04-10: 500 [IU]
  Filled 2019-04-10: qty 5

## 2019-04-10 MED ORDER — DIPHENHYDRAMINE HCL 25 MG PO CAPS
ORAL_CAPSULE | ORAL | Status: AC
  Filled 2019-04-10: qty 4

## 2019-04-10 MED ORDER — SODIUM CHLORIDE 0.9% IV SOLUTION
250.0000 mL | Freq: Once | INTRAVENOUS | Status: AC
  Administered 2019-04-10: 250 mL via INTRAVENOUS
  Filled 2019-04-10: qty 250

## 2019-04-10 MED ORDER — DIPHENHYDRAMINE HCL 25 MG PO CAPS
50.0000 mg | ORAL_CAPSULE | Freq: Once | ORAL | Status: AC
  Administered 2019-04-10: 09:00:00 50 mg via ORAL

## 2019-04-10 NOTE — Patient Instructions (Signed)

## 2019-04-11 LAB — BPAM RBC
Blood Product Expiration Date: 202103222359
Blood Product Expiration Date: 202103222359
ISSUE DATE / TIME: 202102170821
ISSUE DATE / TIME: 202102170821
Unit Type and Rh: 6200
Unit Type and Rh: 6200

## 2019-04-11 LAB — TYPE AND SCREEN
ABO/RH(D): A POS
Antibody Screen: POSITIVE
DAT, IgG: POSITIVE
Unit division: 0
Unit division: 0

## 2019-04-15 ENCOUNTER — Other Ambulatory Visit: Payer: Medicare Other

## 2019-04-17 ENCOUNTER — Inpatient Hospital Stay: Payer: Medicare Other

## 2019-04-17 ENCOUNTER — Other Ambulatory Visit: Payer: Self-pay

## 2019-04-17 VITALS — BP 112/53 | HR 73 | Temp 97.3°F | Resp 18

## 2019-04-17 DIAGNOSIS — C8307 Small cell B-cell lymphoma, spleen: Secondary | ICD-10-CM

## 2019-04-17 DIAGNOSIS — Z5111 Encounter for antineoplastic chemotherapy: Secondary | ICD-10-CM | POA: Diagnosis not present

## 2019-04-17 LAB — CBC WITH DIFFERENTIAL (CANCER CENTER ONLY)
Abs Immature Granulocytes: 0.02 10*3/uL (ref 0.00–0.07)
Basophils Absolute: 0 10*3/uL (ref 0.0–0.1)
Basophils Relative: 0 %
Eosinophils Absolute: 0.1 10*3/uL (ref 0.0–0.5)
Eosinophils Relative: 1 %
HCT: 34.1 % — ABNORMAL LOW (ref 36.0–46.0)
Hemoglobin: 10.2 g/dL — ABNORMAL LOW (ref 12.0–15.0)
Immature Granulocytes: 0 %
Lymphocytes Relative: 8 %
Lymphs Abs: 0.4 10*3/uL — ABNORMAL LOW (ref 0.7–4.0)
MCH: 31.5 pg (ref 26.0–34.0)
MCHC: 29.9 g/dL — ABNORMAL LOW (ref 30.0–36.0)
MCV: 105.2 fL — ABNORMAL HIGH (ref 80.0–100.0)
Monocytes Absolute: 0.2 10*3/uL (ref 0.1–1.0)
Monocytes Relative: 5 %
Neutro Abs: 3.9 10*3/uL (ref 1.7–7.7)
Neutrophils Relative %: 86 %
Platelet Count: 112 10*3/uL — ABNORMAL LOW (ref 150–400)
RBC: 3.24 MIL/uL — ABNORMAL LOW (ref 3.87–5.11)
RDW: 14.2 % (ref 11.5–15.5)
WBC Count: 4.6 10*3/uL (ref 4.0–10.5)
nRBC: 0 % (ref 0.0–0.2)

## 2019-04-17 LAB — CMP (CANCER CENTER ONLY)
ALT: 12 U/L (ref 0–44)
AST: 21 U/L (ref 15–41)
Albumin: 3.8 g/dL (ref 3.5–5.0)
Alkaline Phosphatase: 87 U/L (ref 38–126)
Anion gap: 5 (ref 5–15)
BUN: 19 mg/dL (ref 8–23)
CO2: 28 mmol/L (ref 22–32)
Calcium: 8.7 mg/dL — ABNORMAL LOW (ref 8.9–10.3)
Chloride: 106 mmol/L (ref 98–111)
Creatinine: 0.64 mg/dL (ref 0.44–1.00)
GFR, Est AFR Am: >60 mL/min (ref >60)
GFR, Estimated: >60 mL/min (ref >60)
Glucose, Bld: 114 mg/dL — ABNORMAL HIGH (ref 70–99)
Potassium: 4.1 mmol/L (ref 3.5–5.1)
Sodium: 139 mmol/L (ref 135–145)
Total Bilirubin: 2.2 mg/dL — ABNORMAL HIGH (ref 0.3–1.2)
Total Protein: 6 g/dL — ABNORMAL LOW (ref 6.5–8.1)

## 2019-04-17 LAB — RETICULOCYTES
Immature Retic Fract: 17.3 % — ABNORMAL HIGH (ref 2.3–15.9)
RBC.: 3.27 MIL/uL — ABNORMAL LOW (ref 3.87–5.11)
Retic Count, Absolute: 249.2 10*3/uL — ABNORMAL HIGH (ref 19.0–186.0)
Retic Ct Pct: 7.6 % — ABNORMAL HIGH (ref 0.4–3.1)

## 2019-04-17 MED ORDER — LIDOCAINE-PRILOCAINE 2.5-2.5 % EX CREA: TOPICAL_CREAM | CUTANEOUS | 3 refills | Status: AC

## 2019-04-17 MED ORDER — ACETAMINOPHEN 325 MG PO TABS
ORAL_TABLET | ORAL | Status: AC
  Filled 2019-04-17: qty 2

## 2019-04-17 MED ORDER — SODIUM CHLORIDE 0.9 % IV SOLN
Freq: Once | INTRAVENOUS | Status: AC
  Filled 2019-04-17: qty 250

## 2019-04-17 MED ORDER — DEXAMETHASONE SODIUM PHOSPHATE 10 MG/ML IJ SOLN
INTRAMUSCULAR | Status: AC
  Filled 2019-04-17: qty 1

## 2019-04-17 MED ORDER — PALONOSETRON HCL INJECTION 0.25 MG/5ML
0.2500 mg | Freq: Once | INTRAVENOUS | Status: AC
  Administered 2019-04-17: 0.25 mg via INTRAVENOUS

## 2019-04-17 MED ORDER — PROCHLORPERAZINE MALEATE 10 MG PO TABS: 10.0000 mg | ORAL_TABLET | Freq: Four times a day (QID) | ORAL | 1 refills | Status: AC | PRN

## 2019-04-17 MED ORDER — HEPARIN SOD (PORK) LOCK FLUSH 100 UNIT/ML IV SOLN
500.0000 [IU] | Freq: Once | INTRAVENOUS | Status: AC | PRN
  Administered 2019-04-17: 500 [IU]
  Filled 2019-04-17: qty 5

## 2019-04-17 MED ORDER — SODIUM CHLORIDE 0.9% FLUSH
10.0000 mL | INTRAVENOUS | Status: DC | PRN
  Administered 2019-04-17: 10 mL
  Filled 2019-04-17: qty 10

## 2019-04-17 MED ORDER — DEXAMETHASONE 4 MG PO TABS: 8.0000 mg | ORAL_TABLET | Freq: Every day | ORAL | 1 refills | Status: AC

## 2019-04-17 MED ORDER — ONDANSETRON HCL 8 MG PO TABS: 8.0000 mg | ORAL_TABLET | Freq: Two times a day (BID) | ORAL | 1 refills | Status: AC | PRN

## 2019-04-17 MED ORDER — DEXAMETHASONE SODIUM PHOSPHATE 10 MG/ML IJ SOLN
10.0000 mg | Freq: Once | INTRAMUSCULAR | Status: AC
  Administered 2019-04-17: 10 mg via INTRAVENOUS

## 2019-04-17 MED ORDER — DIPHENHYDRAMINE HCL 25 MG PO CAPS
50.0000 mg | ORAL_CAPSULE | Freq: Once | ORAL | Status: AC
  Administered 2019-04-17: 50 mg via ORAL

## 2019-04-17 MED ORDER — ACETAMINOPHEN 325 MG PO TABS
650.0000 mg | ORAL_TABLET | Freq: Once | ORAL | Status: AC
  Administered 2019-04-17: 650 mg via ORAL

## 2019-04-17 MED ORDER — ACYCLOVIR 400 MG PO TABS: 400.0000 mg | ORAL_TABLET | Freq: Every day | ORAL | 3 refills | Status: DC

## 2019-04-17 MED ORDER — LORAZEPAM 0.5 MG PO TABS: 0.5000 mg | ORAL_TABLET | Freq: Four times a day (QID) | ORAL | 0 refills | Status: AC | PRN

## 2019-04-17 MED ORDER — ALLOPURINOL 300 MG PO TABS: 300.0000 mg | ORAL_TABLET | Freq: Every day | ORAL | 3 refills | Status: DC

## 2019-04-17 MED ORDER — DIPHENHYDRAMINE HCL 25 MG PO CAPS
ORAL_CAPSULE | ORAL | Status: AC
  Filled 2019-04-17: qty 2

## 2019-04-17 MED ORDER — SODIUM CHLORIDE 0.9 % IV SOLN
375.0000 mg/m2 | Freq: Once | INTRAVENOUS | Status: AC
  Administered 2019-04-17: 700 mg via INTRAVENOUS
  Filled 2019-04-17: qty 20

## 2019-04-17 MED ORDER — SODIUM CHLORIDE 0.9 % IV SOLN
72.0000 mg/m2 | Freq: Once | INTRAVENOUS | Status: AC
  Administered 2019-04-17: 125 mg via INTRAVENOUS
  Filled 2019-04-17: qty 5

## 2019-04-17 NOTE — Patient Instructions (Signed)
Rituximab injection What is this medicine? RITUXIMAB (ri TUX i mab) is a monoclonal antibody. It is used to treat certain types of cancer like non-Hodgkin lymphoma and chronic lymphocytic leukemia. It is also used to treat rheumatoid arthritis, granulomatosis with polyangiitis (or Wegener's granulomatosis), microscopic polyangiitis, and pemphigus vulgaris. This medicine may be used for other purposes; ask your health care provider or pharmacist if you have questions. COMMON BRAND NAME(S): Rituxan, RUXIENCE What should I tell my health care provider before I take this medicine? They need to know if you have any of these conditions:  heart disease  infection (especially a virus infection such as hepatitis B, chickenpox, cold sores, or herpes)  immune system problems  irregular heartbeat  kidney disease  low blood counts, like low white cell, platelet, or red cell counts  lung or breathing disease, like asthma  recently received or scheduled to receive a vaccine  an unusual or allergic reaction to rituximab, other medicines, foods, dyes, or preservatives  pregnant or trying to get pregnant  breast-feeding How should I use this medicine? This medicine is for infusion into a vein. It is administered in a hospital or clinic by a specially trained health care professional. A special MedGuide will be given to you by the pharmacist with each prescription and refill. Be sure to read this information carefully each time. Talk to your pediatrician regarding the use of this medicine in children. This medicine is not approved for use in children. Overdosage: If you think you have taken too much of this medicine contact a poison control center or emergency room at once. NOTE: This medicine is only for you. Do not share this medicine with others. What if I miss a dose? It is important not to miss a dose. Call your doctor or health care professional if you are unable to keep an appointment. What  may interact with this medicine?  cisplatin  live virus vaccines This list may not describe all possible interactions. Give your health care provider a list of all the medicines, herbs, non-prescription drugs, or dietary supplements you use. Also tell them if you smoke, drink alcohol, or use illegal drugs. Some items may interact with your medicine. What should I watch for while using this medicine? Your condition will be monitored carefully while you are receiving this medicine. You may need blood work done while you are taking this medicine. This medicine can cause serious allergic reactions. To reduce your risk you may need to take medicine before treatment with this medicine. Take your medicine as directed. In some patients, this medicine may cause a serious brain infection that may cause death. If you have any problems seeing, thinking, speaking, walking, or standing, tell your healthcare professional right away. If you cannot reach your healthcare professional, urgently seek other source of medical care. Call your doctor or health care professional for advice if you get a fever, chills or sore throat, or other symptoms of a cold or flu. Do not treat yourself. This drug decreases your body's ability to fight infections. Try to avoid being around people who are sick. Do not become pregnant while taking this medicine or for at least 12 months after stopping it. Women should inform their doctor if they wish to become pregnant or think they might be pregnant. There is a potential for serious side effects to an unborn child. Talk to your health care professional or pharmacist for more information. Do not breast-feed an infant while taking this medicine or for at   least 6 months after stopping it. What side effects may I notice from receiving this medicine? Side effects that you should report to your doctor or health care professional as soon as possible:  allergic reactions like skin rash, itching or  hives; swelling of the face, lips, or tongue  breathing problems  chest pain  changes in vision  diarrhea  headache with fever, neck stiffness, sensitivity to light, nausea, or confusion  fast, irregular heartbeat  loss of memory  low blood counts - this medicine may decrease the number of white blood cells, red blood cells and platelets. You may be at increased risk for infections and bleeding.  mouth sores  problems with balance, talking, or walking  redness, blistering, peeling or loosening of the skin, including inside the mouth  signs of infection - fever or chills, cough, sore throat, pain or difficulty passing urine  signs and symptoms of kidney injury like trouble passing urine or change in the amount of urine  signs and symptoms of liver injury like dark yellow or brown urine; general ill feeling or flu-like symptoms; light-colored stools; loss of appetite; nausea; right upper belly pain; unusually weak or tired; yellowing of the eyes or skin  signs and symptoms of low blood pressure like dizziness; feeling faint or lightheaded, falls; unusually weak or tired  stomach pain  swelling of the ankles, feet, hands  unusual bleeding or bruising  vomiting Side effects that usually do not require medical attention (report to your doctor or health care professional if they continue or are bothersome):  headache  joint pain  muscle cramps or muscle pain  nausea  tiredness This list may not describe all possible side effects. Call your doctor for medical advice about side effects. You may report side effects to FDA at 1-800-FDA-1088. Where should I keep my medicine? This drug is given in a hospital or clinic and will not be stored at home. NOTE: This sheet is a summary. It may not cover all possible information. If you have questions about this medicine, talk to your doctor, pharmacist, or health care provider.  2020 Elsevier/Gold Standard (2018-03-21  22:01:36) Bendamustine Injection What is this medicine? BENDAMUSTINE (BEN da MUS teen) is a chemotherapy drug. It is used to treat chronic lymphocytic leukemia and non-Hodgkin lymphoma. This medicine may be used for other purposes; ask your health care provider or pharmacist if you have questions. COMMON BRAND NAME(S): BELRAPZO, BENDEKA, Treanda What should I tell my health care provider before I take this medicine? They need to know if you have any of these conditions:  infection (especially a virus infection such as chickenpox, cold sores, or herpes)  kidney disease  liver disease  an unusual or allergic reaction to bendamustine, mannitol, other medicines, foods, dyes, or preservatives  pregnant or trying to get pregnant  breast-feeding How should I use this medicine? This medicine is for infusion into a vein. It is given by a health care professional in a hospital or clinic setting. Talk to your pediatrician regarding the use of this medicine in children. Special care may be needed. Overdosage: If you think you have taken too much of this medicine contact a poison control center or emergency room at once. NOTE: This medicine is only for you. Do not share this medicine with others. What if I miss a dose? It is important not to miss your dose. Call your doctor or health care professional if you are unable to keep an appointment. What may interact with this   medicine? Do not take this medicine with any of the following medications:  clozapine This medicine may also interact with the following medications:  atazanavir  cimetidine  ciprofloxacin  enoxacin  fluvoxamine  medicines for seizures like carbamazepine and phenobarbital  mexiletine  rifampin  tacrine  thiabendazole  zileuton This list may not describe all possible interactions. Give your health care provider a list of all the medicines, herbs, non-prescription drugs, or dietary supplements you use. Also tell  them if you smoke, drink alcohol, or use illegal drugs. Some items may interact with your medicine. What should I watch for while using this medicine? This drug may make you feel generally unwell. This is not uncommon, as chemotherapy can affect healthy cells as well as cancer cells. Report any side effects. Continue your course of treatment even though you feel ill unless your doctor tells you to stop. You may need blood work done while you are taking this medicine. Call your doctor or healthcare provider for advice if you get a fever, chills or sore throat, or other symptoms of a cold or flu. Do not treat yourself. This drug decreases your body's ability to fight infections. Try to avoid being around people who are sick. This medicine may cause serious skin reactions. They can happen weeks to months after starting the medicine. Contact your healthcare provider right away if you notice fevers or flu-like symptoms with a rash. The rash may be red or purple and then turn into blisters or peeling of the skin. Or, you might notice a red rash with swelling of the face, lips or lymph nodes in your neck or under your arms. This medicine may increase your risk to bruise or bleed. Call your doctor or healthcare provider if you notice any unusual bleeding. Talk to your doctor about your risk of cancer. You may be more at risk for certain types of cancers if you take this medicine. Do not become pregnant while taking this medicine or for at least 6 months after stopping it. Women should inform their doctor if they wish to become pregnant or think they might be pregnant. Men should not father a child while taking this medicine and for at least 3 months after stopping it. There is a potential for serious side effects to an unborn child. Talk to your healthcare provider or pharmacist for more information. Do not breast-feed an infant while taking this medicine or for at least 1 week after stopping it. This medicine may  make it more difficult to father a child. You should talk with your doctor or healthcare provider if you are concerned about your fertility. What side effects may I notice from receiving this medicine? Side effects that you should report to your doctor or health care professional as soon as possible:  allergic reactions like skin rash, itching or hives, swelling of the face, lips, or tongue  low blood counts - this medicine may decrease the number of white blood cells, red blood cells and platelets. You may be at increased risk for infections and bleeding.  rash, fever, and swollen lymph nodes  redness, blistering, peeling, or loosening of the skin, including inside the mouth  signs of infection like fever or chills, cough, sore throat, pain or difficulty passing urine  signs of decreased platelets or bleeding like bruising, pinpoint red spots on the skin, black, tarry stools, blood in the urine  signs of decreased red blood cells like being unusually weak or tired, fainting spells, lightheadedness  signs   and symptoms of kidney injury like trouble passing urine or change in the amount of urine  signs and symptoms of liver injury like dark yellow or brown urine; general ill feeling or flu-like symptoms; light-colored stools; loss of appetite; nausea; right upper belly pain; unusually weak or tired; yellowing of the eyes or skin Side effects that usually do not require medical attention (report to your doctor or health care professional if they continue or are bothersome):  constipation  decreased appetite  diarrhea  headache  mouth sores  nausea, vomiting  tiredness This list may not describe all possible side effects. Call your doctor for medical advice about side effects. You may report side effects to FDA at 1-800-FDA-1088. Where should I keep my medicine? This drug is given in a hospital or clinic and will not be stored at home. NOTE: This sheet is a summary. It may not  cover all possible information. If you have questions about this medicine, talk to your doctor, pharmacist, or health care provider.  2020 Elsevier/Gold Standard (2018-05-01 10:26:46)  

## 2019-04-17 NOTE — Progress Notes (Signed)
Labs cbc and cmet reviewed by MD ok to treat

## 2019-04-18 ENCOUNTER — Inpatient Hospital Stay: Payer: Medicare Other

## 2019-04-18 VITALS — BP 115/97 | HR 87 | Resp 20

## 2019-04-18 DIAGNOSIS — C8307 Small cell B-cell lymphoma, spleen: Secondary | ICD-10-CM

## 2019-04-18 DIAGNOSIS — Z5111 Encounter for antineoplastic chemotherapy: Secondary | ICD-10-CM | POA: Diagnosis not present

## 2019-04-18 MED ORDER — SODIUM CHLORIDE 0.9 % IV SOLN
Freq: Once | INTRAVENOUS | Status: AC
  Filled 2019-04-18: qty 250

## 2019-04-18 MED ORDER — SODIUM CHLORIDE 0.9% FLUSH
10.0000 mL | INTRAVENOUS | Status: DC | PRN
  Administered 2019-04-18: 10 mL
  Filled 2019-04-18: qty 10

## 2019-04-18 MED ORDER — HEPARIN SOD (PORK) LOCK FLUSH 100 UNIT/ML IV SOLN
500.0000 [IU] | Freq: Once | INTRAVENOUS | Status: AC | PRN
  Administered 2019-04-18: 500 [IU]
  Filled 2019-04-18: qty 5

## 2019-04-18 MED ORDER — SODIUM CHLORIDE 0.9 % IV SOLN
72.0000 mg/m2 | Freq: Once | INTRAVENOUS | Status: AC
  Administered 2019-04-18: 125 mg via INTRAVENOUS
  Filled 2019-04-18: qty 5

## 2019-04-18 MED ORDER — DEXAMETHASONE SODIUM PHOSPHATE 10 MG/ML IJ SOLN
10.0000 mg | Freq: Once | INTRAMUSCULAR | Status: AC
  Administered 2019-04-18: 10 mg via INTRAVENOUS

## 2019-04-18 MED ORDER — DEXAMETHASONE SODIUM PHOSPHATE 10 MG/ML IJ SOLN
INTRAMUSCULAR | Status: AC
  Filled 2019-04-18: qty 1

## 2019-04-18 NOTE — Patient Instructions (Signed)
Bendamustine Injection What is this medicine? BENDAMUSTINE (BEN da MUS teen) is a chemotherapy drug. It is used to treat chronic lymphocytic leukemia and non-Hodgkin lymphoma. This medicine may be used for other purposes; ask your health care provider or pharmacist if you have questions. COMMON BRAND NAME(S): BELRAPZO, BENDEKA, Treanda What should I tell my health care provider before I take this medicine? They need to know if you have any of these conditions:  infection (especially a virus infection such as chickenpox, cold sores, or herpes)  kidney disease  liver disease  an unusual or allergic reaction to bendamustine, mannitol, other medicines, foods, dyes, or preservatives  pregnant or trying to get pregnant  breast-feeding How should I use this medicine? This medicine is for infusion into a vein. It is given by a health care professional in a hospital or clinic setting. Talk to your pediatrician regarding the use of this medicine in children. Special care may be needed. Overdosage: If you think you have taken too much of this medicine contact a poison control center or emergency room at once. NOTE: This medicine is only for you. Do not share this medicine with others. What if I miss a dose? It is important not to miss your dose. Call your doctor or health care professional if you are unable to keep an appointment. What may interact with this medicine? Do not take this medicine with any of the following medications:  clozapine This medicine may also interact with the following medications:  atazanavir  cimetidine  ciprofloxacin  enoxacin  fluvoxamine  medicines for seizures like carbamazepine and phenobarbital  mexiletine  rifampin  tacrine  thiabendazole  zileuton This list may not describe all possible interactions. Give your health care provider a list of all the medicines, herbs, non-prescription drugs, or dietary supplements you use. Also tell them if  you smoke, drink alcohol, or use illegal drugs. Some items may interact with your medicine. What should I watch for while using this medicine? This drug may make you feel generally unwell. This is not uncommon, as chemotherapy can affect healthy cells as well as cancer cells. Report any side effects. Continue your course of treatment even though you feel ill unless your doctor tells you to stop. You may need blood work done while you are taking this medicine. Call your doctor or healthcare provider for advice if you get a fever, chills or sore throat, or other symptoms of a cold or flu. Do not treat yourself. This drug decreases your body's ability to fight infections. Try to avoid being around people who are sick. This medicine may cause serious skin reactions. They can happen weeks to months after starting the medicine. Contact your healthcare provider right away if you notice fevers or flu-like symptoms with a rash. The rash may be red or purple and then turn into blisters or peeling of the skin. Or, you might notice a red rash with swelling of the face, lips or lymph nodes in your neck or under your arms. This medicine may increase your risk to bruise or bleed. Call your doctor or healthcare provider if you notice any unusual bleeding. Talk to your doctor about your risk of cancer. You may be more at risk for certain types of cancers if you take this medicine. Do not become pregnant while taking this medicine or for at least 6 months after stopping it. Women should inform their doctor if they wish to become pregnant or think they might be pregnant. Men should not   father a child while taking this medicine and for at least 3 months after stopping it. There is a potential for serious side effects to an unborn child. Talk to your healthcare provider or pharmacist for more information. Do not breast-feed an infant while taking this medicine or for at least 1 week after stopping it. This medicine may make it  more difficult to father a child. You should talk with your doctor or healthcare provider if you are concerned about your fertility. What side effects may I notice from receiving this medicine? Side effects that you should report to your doctor or health care professional as soon as possible:  allergic reactions like skin rash, itching or hives, swelling of the face, lips, or tongue  low blood counts - this medicine may decrease the number of white blood cells, red blood cells and platelets. You may be at increased risk for infections and bleeding.  rash, fever, and swollen lymph nodes  redness, blistering, peeling, or loosening of the skin, including inside the mouth  signs of infection like fever or chills, cough, sore throat, pain or difficulty passing urine  signs of decreased platelets or bleeding like bruising, pinpoint red spots on the skin, black, tarry stools, blood in the urine  signs of decreased red blood cells like being unusually weak or tired, fainting spells, lightheadedness  signs and symptoms of kidney injury like trouble passing urine or change in the amount of urine  signs and symptoms of liver injury like dark yellow or brown urine; general ill feeling or flu-like symptoms; light-colored stools; loss of appetite; nausea; right upper belly pain; unusually weak or tired; yellowing of the eyes or skin Side effects that usually do not require medical attention (report to your doctor or health care professional if they continue or are bothersome):  constipation  decreased appetite  diarrhea  headache  mouth sores  nausea, vomiting  tiredness This list may not describe all possible side effects. Call your doctor for medical advice about side effects. You may report side effects to FDA at 1-800-FDA-1088. Where should I keep my medicine? This drug is given in a hospital or clinic and will not be stored at home. NOTE: This sheet is a summary. It may not cover all  possible information. If you have questions about this medicine, talk to your doctor, pharmacist, or health care provider.  2020 Elsevier/Gold Standard (2018-05-01 10:26:46)  

## 2019-04-19 ENCOUNTER — Other Ambulatory Visit: Payer: Self-pay | Admitting: Hematology & Oncology

## 2019-04-22 ENCOUNTER — Other Ambulatory Visit: Payer: Self-pay

## 2019-04-22 ENCOUNTER — Inpatient Hospital Stay: Payer: Medicare Other | Attending: Hematology & Oncology

## 2019-04-22 ENCOUNTER — Inpatient Hospital Stay: Payer: Medicare Other

## 2019-04-22 VITALS — BP 128/64 | HR 71 | Temp 96.6°F | Resp 18

## 2019-04-22 DIAGNOSIS — D589 Hereditary hemolytic anemia, unspecified: Secondary | ICD-10-CM

## 2019-04-22 DIAGNOSIS — Z452 Encounter for adjustment and management of vascular access device: Secondary | ICD-10-CM | POA: Diagnosis present

## 2019-04-22 DIAGNOSIS — Z79899 Other long term (current) drug therapy: Secondary | ICD-10-CM | POA: Insufficient documentation

## 2019-04-22 DIAGNOSIS — Z7952 Long term (current) use of systemic steroids: Secondary | ICD-10-CM | POA: Diagnosis not present

## 2019-04-22 DIAGNOSIS — E785 Hyperlipidemia, unspecified: Secondary | ICD-10-CM | POA: Diagnosis not present

## 2019-04-22 DIAGNOSIS — Z5112 Encounter for antineoplastic immunotherapy: Secondary | ICD-10-CM | POA: Diagnosis present

## 2019-04-22 DIAGNOSIS — Z7982 Long term (current) use of aspirin: Secondary | ICD-10-CM | POA: Diagnosis not present

## 2019-04-22 DIAGNOSIS — D509 Iron deficiency anemia, unspecified: Secondary | ICD-10-CM | POA: Diagnosis not present

## 2019-04-22 DIAGNOSIS — D5911 Warm autoimmune hemolytic anemia: Secondary | ICD-10-CM | POA: Insufficient documentation

## 2019-04-22 DIAGNOSIS — C8307 Small cell B-cell lymphoma, spleen: Secondary | ICD-10-CM | POA: Insufficient documentation

## 2019-04-22 DIAGNOSIS — I10 Essential (primary) hypertension: Secondary | ICD-10-CM | POA: Diagnosis not present

## 2019-04-22 DIAGNOSIS — D500 Iron deficiency anemia secondary to blood loss (chronic): Secondary | ICD-10-CM

## 2019-04-22 DIAGNOSIS — Z7901 Long term (current) use of anticoagulants: Secondary | ICD-10-CM | POA: Diagnosis not present

## 2019-04-22 DIAGNOSIS — Z5111 Encounter for antineoplastic chemotherapy: Secondary | ICD-10-CM | POA: Insufficient documentation

## 2019-04-22 DIAGNOSIS — R5383 Other fatigue: Secondary | ICD-10-CM | POA: Insufficient documentation

## 2019-04-22 LAB — CMP (CANCER CENTER ONLY)
ALT: 11 U/L (ref 0–44)
AST: 17 U/L (ref 15–41)
Albumin: 4.2 g/dL (ref 3.5–5.0)
Alkaline Phosphatase: 94 U/L (ref 38–126)
Anion gap: 7 (ref 5–15)
BUN: 9 mg/dL (ref 8–23)
CO2: 30 mmol/L (ref 22–32)
Calcium: 9.4 mg/dL (ref 8.9–10.3)
Chloride: 102 mmol/L (ref 98–111)
Creatinine: 0.69 mg/dL (ref 0.44–1.00)
GFR, Est AFR Am: >60 mL/min (ref >60)
GFR, Estimated: >60 mL/min (ref >60)
Glucose, Bld: 143 mg/dL — ABNORMAL HIGH (ref 70–99)
Potassium: 3.7 mmol/L (ref 3.5–5.1)
Sodium: 139 mmol/L (ref 135–145)
Total Bilirubin: 1.8 mg/dL — ABNORMAL HIGH (ref 0.3–1.2)
Total Protein: 6.2 g/dL — ABNORMAL LOW (ref 6.5–8.1)

## 2019-04-22 LAB — CBC WITH DIFFERENTIAL (CANCER CENTER ONLY)
Abs Immature Granulocytes: 0.03 10*3/uL (ref 0.00–0.07)
Basophils Absolute: 0 10*3/uL (ref 0.0–0.1)
Basophils Relative: 0 %
Eosinophils Absolute: 0 10*3/uL (ref 0.0–0.5)
Eosinophils Relative: 0 %
HCT: 33.9 % — ABNORMAL LOW (ref 36.0–46.0)
Hemoglobin: 10.5 g/dL — ABNORMAL LOW (ref 12.0–15.0)
Immature Granulocytes: 1 %
Lymphocytes Relative: 2 %
Lymphs Abs: 0.1 10*3/uL — ABNORMAL LOW (ref 0.7–4.0)
MCH: 31.6 pg (ref 26.0–34.0)
MCHC: 31 g/dL (ref 30.0–36.0)
MCV: 102.1 fL — ABNORMAL HIGH (ref 80.0–100.0)
Monocytes Absolute: 0.2 10*3/uL (ref 0.1–1.0)
Monocytes Relative: 6 %
Neutro Abs: 3.2 10*3/uL (ref 1.7–7.7)
Neutrophils Relative %: 91 %
Platelet Count: 151 10*3/uL (ref 150–400)
RBC: 3.32 MIL/uL — ABNORMAL LOW (ref 3.87–5.11)
RDW: 13.1 % (ref 11.5–15.5)
WBC Count: 3.5 10*3/uL — ABNORMAL LOW (ref 4.0–10.5)
nRBC: 0.6 % — ABNORMAL HIGH (ref 0.0–0.2)

## 2019-04-22 MED ORDER — SODIUM CHLORIDE 0.9% FLUSH
10.0000 mL | INTRAVENOUS | Status: DC | PRN
  Administered 2019-04-22: 10:00:00 10 mL via INTRAVENOUS
  Filled 2019-04-22: qty 10

## 2019-04-22 MED ORDER — HEPARIN SOD (PORK) LOCK FLUSH 100 UNIT/ML IV SOLN
500.0000 [IU] | Freq: Once | INTRAVENOUS | Status: AC | PRN
  Administered 2019-04-22: 500 [IU] via INTRAVENOUS
  Filled 2019-04-22: qty 5

## 2019-04-22 NOTE — Patient Instructions (Signed)

## 2019-04-29 ENCOUNTER — Inpatient Hospital Stay: Payer: Medicare Other

## 2019-04-29 ENCOUNTER — Other Ambulatory Visit: Payer: Self-pay

## 2019-04-29 DIAGNOSIS — Z5111 Encounter for antineoplastic chemotherapy: Secondary | ICD-10-CM | POA: Diagnosis not present

## 2019-04-29 DIAGNOSIS — C8307 Small cell B-cell lymphoma, spleen: Secondary | ICD-10-CM

## 2019-04-29 DIAGNOSIS — Z95828 Presence of other vascular implants and grafts: Secondary | ICD-10-CM

## 2019-04-29 LAB — CBC WITH DIFFERENTIAL (CANCER CENTER ONLY)
Abs Immature Granulocytes: 0.05 10*3/uL (ref 0.00–0.07)
Basophils Absolute: 0 10*3/uL (ref 0.0–0.1)
Basophils Relative: 1 %
Eosinophils Absolute: 0.1 10*3/uL (ref 0.0–0.5)
Eosinophils Relative: 2 %
HCT: 30.1 % — ABNORMAL LOW (ref 36.0–46.0)
Hemoglobin: 8.9 g/dL — ABNORMAL LOW (ref 12.0–15.0)
Immature Granulocytes: 1 %
Lymphocytes Relative: 4 %
Lymphs Abs: 0.1 10*3/uL — ABNORMAL LOW (ref 0.7–4.0)
MCH: 31.7 pg (ref 26.0–34.0)
MCHC: 29.6 g/dL — ABNORMAL LOW (ref 30.0–36.0)
MCV: 107.1 fL — ABNORMAL HIGH (ref 80.0–100.0)
Monocytes Absolute: 0.2 10*3/uL (ref 0.1–1.0)
Monocytes Relative: 6 %
Neutro Abs: 3 10*3/uL (ref 1.7–7.7)
Neutrophils Relative %: 86 %
Platelet Count: 107 10*3/uL — ABNORMAL LOW (ref 150–400)
RBC: 2.81 MIL/uL — ABNORMAL LOW (ref 3.87–5.11)
RDW: 14.4 % (ref 11.5–15.5)
WBC Count: 3.5 10*3/uL — ABNORMAL LOW (ref 4.0–10.5)
nRBC: 0 % (ref 0.0–0.2)

## 2019-04-29 LAB — CMP (CANCER CENTER ONLY)
ALT: 9 U/L (ref 0–44)
AST: 16 U/L (ref 15–41)
Albumin: 3.9 g/dL (ref 3.5–5.0)
Alkaline Phosphatase: 84 U/L (ref 38–126)
Anion gap: 5 (ref 5–15)
BUN: 8 mg/dL (ref 8–23)
CO2: 28 mmol/L (ref 22–32)
Calcium: 8.9 mg/dL (ref 8.9–10.3)
Chloride: 106 mmol/L (ref 98–111)
Creatinine: 0.63 mg/dL (ref 0.44–1.00)
GFR, Est AFR Am: >60 mL/min (ref >60)
GFR, Estimated: >60 mL/min (ref >60)
Glucose, Bld: 105 mg/dL — ABNORMAL HIGH (ref 70–99)
Potassium: 4.2 mmol/L (ref 3.5–5.1)
Sodium: 139 mmol/L (ref 135–145)
Total Bilirubin: 2 mg/dL — ABNORMAL HIGH (ref 0.3–1.2)
Total Protein: 5.6 g/dL — ABNORMAL LOW (ref 6.5–8.1)

## 2019-04-29 MED ORDER — HEPARIN SOD (PORK) LOCK FLUSH 100 UNIT/ML IV SOLN
500.0000 [IU] | Freq: Once | INTRAVENOUS | Status: AC
  Administered 2019-04-29: 500 [IU] via INTRAVENOUS
  Filled 2019-04-29: qty 5

## 2019-04-29 MED ORDER — SODIUM CHLORIDE 0.9% FLUSH
10.0000 mL | INTRAVENOUS | Status: DC | PRN
  Administered 2019-04-29: 10 mL via INTRAVENOUS
  Filled 2019-04-29: qty 10

## 2019-04-29 NOTE — Progress Notes (Signed)
Hgb 8.9.  Dr. Marin Olp notified.  No orders received today.  Patient scheduled to return next Monday

## 2019-04-29 NOTE — Patient Instructions (Signed)

## 2019-05-06 ENCOUNTER — Other Ambulatory Visit: Payer: Self-pay

## 2019-05-06 ENCOUNTER — Other Ambulatory Visit: Payer: Self-pay | Admitting: *Deleted

## 2019-05-06 ENCOUNTER — Other Ambulatory Visit: Payer: Self-pay | Admitting: Family

## 2019-05-06 ENCOUNTER — Inpatient Hospital Stay: Payer: Medicare Other

## 2019-05-06 VITALS — BP 130/56 | HR 72 | Temp 97.3°F | Resp 20

## 2019-05-06 DIAGNOSIS — D649 Anemia, unspecified: Secondary | ICD-10-CM

## 2019-05-06 DIAGNOSIS — C8307 Small cell B-cell lymphoma, spleen: Secondary | ICD-10-CM

## 2019-05-06 DIAGNOSIS — Z5111 Encounter for antineoplastic chemotherapy: Secondary | ICD-10-CM | POA: Diagnosis not present

## 2019-05-06 DIAGNOSIS — D589 Hereditary hemolytic anemia, unspecified: Secondary | ICD-10-CM

## 2019-05-06 DIAGNOSIS — D500 Iron deficiency anemia secondary to blood loss (chronic): Secondary | ICD-10-CM

## 2019-05-06 LAB — CMP (CANCER CENTER ONLY)
ALT: 11 U/L (ref 0–44)
AST: 21 U/L (ref 15–41)
Albumin: 3.9 g/dL (ref 3.5–5.0)
Alkaline Phosphatase: 90 U/L (ref 38–126)
Anion gap: 7 (ref 5–15)
BUN: 14 mg/dL (ref 8–23)
CO2: 26 mmol/L (ref 22–32)
Calcium: 8.5 mg/dL — ABNORMAL LOW (ref 8.9–10.3)
Chloride: 108 mmol/L (ref 98–111)
Creatinine: 0.57 mg/dL (ref 0.44–1.00)
GFR, Est AFR Am: >60 mL/min (ref >60)
GFR, Estimated: >60 mL/min (ref >60)
Glucose, Bld: 114 mg/dL — ABNORMAL HIGH (ref 70–99)
Potassium: 4 mmol/L (ref 3.5–5.1)
Sodium: 141 mmol/L (ref 135–145)
Total Bilirubin: 2.5 mg/dL — ABNORMAL HIGH (ref 0.3–1.2)
Total Protein: 5.6 g/dL — ABNORMAL LOW (ref 6.5–8.1)

## 2019-05-06 LAB — PREPARE RBC (CROSSMATCH)

## 2019-05-06 LAB — CBC WITH DIFFERENTIAL (CANCER CENTER ONLY)
Abs Immature Granulocytes: 0.05 10*3/uL (ref 0.00–0.07)
Basophils Absolute: 0 10*3/uL (ref 0.0–0.1)
Basophils Relative: 0 %
Eosinophils Absolute: 0.1 10*3/uL (ref 0.0–0.5)
Eosinophils Relative: 2 %
HCT: 26.8 % — ABNORMAL LOW (ref 36.0–46.0)
Hemoglobin: 7.8 g/dL — ABNORMAL LOW (ref 12.0–15.0)
Immature Granulocytes: 2 %
Lymphocytes Relative: 6 %
Lymphs Abs: 0.2 10*3/uL — ABNORMAL LOW (ref 0.7–4.0)
MCH: 31.6 pg (ref 26.0–34.0)
MCHC: 29.1 g/dL — ABNORMAL LOW (ref 30.0–36.0)
MCV: 108.5 fL — ABNORMAL HIGH (ref 80.0–100.0)
Monocytes Absolute: 0.1 10*3/uL (ref 0.1–1.0)
Monocytes Relative: 5 %
Neutro Abs: 2.4 10*3/uL (ref 1.7–7.7)
Neutrophils Relative %: 85 %
Platelet Count: 82 10*3/uL — ABNORMAL LOW (ref 150–400)
RBC: 2.47 MIL/uL — ABNORMAL LOW (ref 3.87–5.11)
RDW: 15.1 % (ref 11.5–15.5)
WBC Count: 2.8 10*3/uL — ABNORMAL LOW (ref 4.0–10.5)
nRBC: 0 % (ref 0.0–0.2)

## 2019-05-06 LAB — RETICULOCYTES
Immature Retic Fract: 27 % — ABNORMAL HIGH (ref 2.3–15.9)
RBC.: 2.43 MIL/uL — ABNORMAL LOW (ref 3.87–5.11)
Retic Count, Absolute: 276.8 10*3/uL — ABNORMAL HIGH (ref 19.0–186.0)
Retic Ct Pct: 11.4 % — ABNORMAL HIGH (ref 0.4–3.1)

## 2019-05-06 MED ORDER — HEPARIN SOD (PORK) LOCK FLUSH 100 UNIT/ML IV SOLN
500.0000 [IU] | Freq: Once | INTRAVENOUS | Status: AC | PRN
  Administered 2019-05-06: 500 [IU] via INTRAVENOUS
  Filled 2019-05-06: qty 5

## 2019-05-06 MED ORDER — SODIUM CHLORIDE 0.9% FLUSH
10.0000 mL | INTRAVENOUS | Status: DC | PRN
  Administered 2019-05-06: 11:00:00 10 mL via INTRAVENOUS
  Filled 2019-05-06: qty 10

## 2019-05-06 NOTE — Patient Instructions (Signed)

## 2019-05-07 ENCOUNTER — Inpatient Hospital Stay: Payer: Medicare Other

## 2019-05-07 DIAGNOSIS — D649 Anemia, unspecified: Secondary | ICD-10-CM

## 2019-05-07 DIAGNOSIS — C8307 Small cell B-cell lymphoma, spleen: Secondary | ICD-10-CM

## 2019-05-07 DIAGNOSIS — Z5111 Encounter for antineoplastic chemotherapy: Secondary | ICD-10-CM | POA: Diagnosis not present

## 2019-05-07 MED ORDER — DIPHENHYDRAMINE HCL 25 MG PO CAPS
ORAL_CAPSULE | ORAL | Status: AC
  Filled 2019-05-07: qty 2

## 2019-05-07 MED ORDER — HEPARIN SOD (PORK) LOCK FLUSH 100 UNIT/ML IV SOLN
500.0000 [IU] | Freq: Every day | INTRAVENOUS | Status: AC | PRN
  Administered 2019-05-07: 500 [IU]
  Filled 2019-05-07: qty 5

## 2019-05-07 MED ORDER — SODIUM CHLORIDE 0.9% FLUSH
3.0000 mL | INTRAVENOUS | Status: DC | PRN
  Filled 2019-05-07: qty 10

## 2019-05-07 MED ORDER — SODIUM CHLORIDE 0.9% IV SOLUTION
250.0000 mL | Freq: Once | INTRAVENOUS | Status: AC
  Administered 2019-05-07: 10:00:00 250 mL via INTRAVENOUS
  Filled 2019-05-07: qty 250

## 2019-05-07 MED ORDER — SODIUM CHLORIDE 0.9% FLUSH
10.0000 mL | INTRAVENOUS | Status: AC | PRN
  Administered 2019-05-07: 15:00:00 10 mL
  Filled 2019-05-07: qty 10

## 2019-05-07 MED ORDER — DIPHENHYDRAMINE HCL 25 MG PO CAPS
25.0000 mg | ORAL_CAPSULE | Freq: Once | ORAL | Status: AC
  Administered 2019-05-07: 10:00:00 25 mg via ORAL

## 2019-05-07 MED ORDER — HEPARIN SOD (PORK) LOCK FLUSH 100 UNIT/ML IV SOLN
250.0000 [IU] | INTRAVENOUS | Status: DC | PRN
  Filled 2019-05-07: qty 5

## 2019-05-07 MED ORDER — ACETAMINOPHEN 325 MG PO TABS
ORAL_TABLET | ORAL | Status: AC
  Filled 2019-05-07: qty 2

## 2019-05-07 MED ORDER — ACETAMINOPHEN 325 MG PO TABS
650.0000 mg | ORAL_TABLET | Freq: Once | ORAL | Status: AC
  Administered 2019-05-07: 10:00:00 650 mg via ORAL

## 2019-05-07 NOTE — Patient Instructions (Signed)

## 2019-05-08 LAB — BPAM RBC
Blood Product Expiration Date: 202103222359
Blood Product Expiration Date: 202104042359
ISSUE DATE / TIME: 202103160749
ISSUE DATE / TIME: 202103160749
Unit Type and Rh: 6200
Unit Type and Rh: 6200

## 2019-05-08 LAB — TYPE AND SCREEN
ABO/RH(D): A POS
Antibody Screen: POSITIVE
DAT, IgG: POSITIVE
Unit division: 0
Unit division: 0

## 2019-05-13 ENCOUNTER — Other Ambulatory Visit: Payer: Self-pay | Admitting: Hematology & Oncology

## 2019-05-13 ENCOUNTER — Other Ambulatory Visit: Payer: Medicare Other

## 2019-05-15 ENCOUNTER — Other Ambulatory Visit: Payer: Self-pay

## 2019-05-15 ENCOUNTER — Inpatient Hospital Stay (HOSPITAL_BASED_OUTPATIENT_CLINIC_OR_DEPARTMENT_OTHER): Payer: Medicare Other | Admitting: Hematology & Oncology

## 2019-05-15 ENCOUNTER — Telehealth: Payer: Self-pay | Admitting: Hematology & Oncology

## 2019-05-15 ENCOUNTER — Inpatient Hospital Stay: Payer: Medicare Other

## 2019-05-15 ENCOUNTER — Encounter: Payer: Self-pay | Admitting: Hematology & Oncology

## 2019-05-15 VITALS — BP 142/59 | HR 84 | Temp 97.1°F | Resp 17

## 2019-05-15 DIAGNOSIS — C8307 Small cell B-cell lymphoma, spleen: Secondary | ICD-10-CM | POA: Diagnosis not present

## 2019-05-15 DIAGNOSIS — Z5111 Encounter for antineoplastic chemotherapy: Secondary | ICD-10-CM | POA: Diagnosis not present

## 2019-05-15 DIAGNOSIS — D5911 Warm autoimmune hemolytic anemia: Secondary | ICD-10-CM | POA: Diagnosis not present

## 2019-05-15 LAB — CMP (CANCER CENTER ONLY)
ALT: 18 U/L (ref 0–44)
AST: 24 U/L (ref 15–41)
Albumin: 3.8 g/dL (ref 3.5–5.0)
Alkaline Phosphatase: 114 U/L (ref 38–126)
Anion gap: 7 (ref 5–15)
BUN: 14 mg/dL (ref 8–23)
CO2: 28 mmol/L (ref 22–32)
Calcium: 8.9 mg/dL (ref 8.9–10.3)
Chloride: 106 mmol/L (ref 98–111)
Creatinine: 0.58 mg/dL (ref 0.44–1.00)
GFR, Est AFR Am: >60 mL/min (ref >60)
GFR, Estimated: >60 mL/min (ref >60)
Glucose, Bld: 116 mg/dL — ABNORMAL HIGH (ref 70–99)
Potassium: 4 mmol/L (ref 3.5–5.1)
Sodium: 141 mmol/L (ref 135–145)
Total Bilirubin: 1.9 mg/dL — ABNORMAL HIGH (ref 0.3–1.2)
Total Protein: 5.8 g/dL — ABNORMAL LOW (ref 6.5–8.1)

## 2019-05-15 LAB — CBC WITH DIFFERENTIAL (CANCER CENTER ONLY)
Abs Immature Granulocytes: 0.02 10*3/uL (ref 0.00–0.07)
Basophils Absolute: 0 10*3/uL (ref 0.0–0.1)
Basophils Relative: 1 %
Eosinophils Absolute: 0.1 10*3/uL (ref 0.0–0.5)
Eosinophils Relative: 2 %
HCT: 31.7 % — ABNORMAL LOW (ref 36.0–46.0)
Hemoglobin: 9.7 g/dL — ABNORMAL LOW (ref 12.0–15.0)
Immature Granulocytes: 1 %
Lymphocytes Relative: 5 %
Lymphs Abs: 0.2 10*3/uL — ABNORMAL LOW (ref 0.7–4.0)
MCH: 32.4 pg (ref 26.0–34.0)
MCHC: 30.6 g/dL (ref 30.0–36.0)
MCV: 106 fL — ABNORMAL HIGH (ref 80.0–100.0)
Monocytes Absolute: 0.2 10*3/uL (ref 0.1–1.0)
Monocytes Relative: 6 %
Neutro Abs: 2.7 10*3/uL (ref 1.7–7.7)
Neutrophils Relative %: 85 %
Platelet Count: 99 10*3/uL — ABNORMAL LOW (ref 150–400)
RBC: 2.99 MIL/uL — ABNORMAL LOW (ref 3.87–5.11)
RDW: 14.7 % (ref 11.5–15.5)
WBC Count: 3.2 10*3/uL — ABNORMAL LOW (ref 4.0–10.5)
nRBC: 0 % (ref 0.0–0.2)

## 2019-05-15 LAB — IRON AND TIBC
Iron: 216 ug/dL — ABNORMAL HIGH (ref 41–142)
Saturation Ratios: 90 % — ABNORMAL HIGH (ref 21–57)
TIBC: 241 ug/dL (ref 236–444)
UIBC: 25 ug/dL — ABNORMAL LOW (ref 120–384)

## 2019-05-15 LAB — FERRITIN: Ferritin: 3510 ng/mL — ABNORMAL HIGH (ref 11–307)

## 2019-05-15 LAB — RETICULOCYTES
Immature Retic Fract: 14.7 % (ref 2.3–15.9)
RBC.: 3 MIL/uL — ABNORMAL LOW (ref 3.87–5.11)
Retic Count, Absolute: 185.7 10*3/uL (ref 19.0–186.0)
Retic Ct Pct: 6.2 % — ABNORMAL HIGH (ref 0.4–3.1)

## 2019-05-15 LAB — LACTATE DEHYDROGENASE: LDH: 375 U/L — ABNORMAL HIGH (ref 98–192)

## 2019-05-15 MED ORDER — ACETAMINOPHEN 325 MG PO TABS
650.0000 mg | ORAL_TABLET | Freq: Once | ORAL | Status: AC
  Administered 2019-05-15: 650 mg via ORAL

## 2019-05-15 MED ORDER — SODIUM CHLORIDE 0.9 % IV SOLN
Freq: Once | INTRAVENOUS | Status: AC
  Filled 2019-05-15: qty 250

## 2019-05-15 MED ORDER — DIPHENHYDRAMINE HCL 25 MG PO CAPS
50.0000 mg | ORAL_CAPSULE | Freq: Once | ORAL | Status: AC
  Administered 2019-05-15: 50 mg via ORAL

## 2019-05-15 MED ORDER — SODIUM CHLORIDE 0.9% FLUSH
10.0000 mL | INTRAVENOUS | Status: DC | PRN
  Administered 2019-05-15: 10 mL
  Filled 2019-05-15: qty 10

## 2019-05-15 MED ORDER — ACETAMINOPHEN 325 MG PO TABS
ORAL_TABLET | ORAL | Status: AC
  Filled 2019-05-15: qty 2

## 2019-05-15 MED ORDER — PALONOSETRON HCL INJECTION 0.25 MG/5ML
INTRAVENOUS | Status: AC
  Filled 2019-05-15: qty 5

## 2019-05-15 MED ORDER — SODIUM CHLORIDE 0.9 % IV SOLN
72.0000 mg/m2 | Freq: Once | INTRAVENOUS | Status: AC
  Administered 2019-05-15: 125 mg via INTRAVENOUS
  Filled 2019-05-15: qty 5

## 2019-05-15 MED ORDER — HEPARIN SOD (PORK) LOCK FLUSH 100 UNIT/ML IV SOLN
500.0000 [IU] | Freq: Once | INTRAVENOUS | Status: AC | PRN
  Administered 2019-05-15: 500 [IU]
  Filled 2019-05-15: qty 5

## 2019-05-15 MED ORDER — DEXAMETHASONE SODIUM PHOSPHATE 10 MG/ML IJ SOLN
INTRAMUSCULAR | Status: AC
  Filled 2019-05-15: qty 1

## 2019-05-15 MED ORDER — DEXAMETHASONE SODIUM PHOSPHATE 10 MG/ML IJ SOLN
10.0000 mg | Freq: Once | INTRAMUSCULAR | Status: AC
  Administered 2019-05-15: 10 mg via INTRAVENOUS

## 2019-05-15 MED ORDER — SODIUM CHLORIDE 0.9 % IV SOLN
375.0000 mg/m2 | Freq: Once | INTRAVENOUS | Status: AC
  Administered 2019-05-15: 700 mg via INTRAVENOUS
  Filled 2019-05-15: qty 50

## 2019-05-15 MED ORDER — DIPHENHYDRAMINE HCL 25 MG PO CAPS
ORAL_CAPSULE | ORAL | Status: AC
  Filled 2019-05-15: qty 2

## 2019-05-15 MED ORDER — SODIUM CHLORIDE 0.9 % IV SOLN
INTRAVENOUS | Status: DC
  Filled 2019-05-15: qty 250

## 2019-05-15 MED ORDER — PALONOSETRON HCL INJECTION 0.25 MG/5ML
0.2500 mg | Freq: Once | INTRAVENOUS | Status: AC
  Administered 2019-05-15: 0.25 mg via INTRAVENOUS

## 2019-05-15 NOTE — Telephone Encounter (Signed)
Appointments scheduled calendar printed per 3/24 los 

## 2019-05-15 NOTE — Patient Instructions (Signed)

## 2019-05-15 NOTE — Progress Notes (Signed)
Ok to treat despite labs, Per MD

## 2019-05-15 NOTE — Patient Instructions (Signed)
Rituximab injection What is this medicine? RITUXIMAB (ri TUX i mab) is a monoclonal antibody. It is used to treat certain types of cancer like non-Hodgkin lymphoma and chronic lymphocytic leukemia. It is also used to treat rheumatoid arthritis, granulomatosis with polyangiitis (or Wegener's granulomatosis), microscopic polyangiitis, and pemphigus vulgaris. This medicine may be used for other purposes; ask your health care provider or pharmacist if you have questions. COMMON BRAND NAME(S): Rituxan, RUXIENCE What should I tell my health care provider before I take this medicine? They need to know if you have any of these conditions:  heart disease  infection (especially a virus infection such as hepatitis B, chickenpox, cold sores, or herpes)  immune system problems  irregular heartbeat  kidney disease  low blood counts, like low white cell, platelet, or red cell counts  lung or breathing disease, like asthma  recently received or scheduled to receive a vaccine  an unusual or allergic reaction to rituximab, other medicines, foods, dyes, or preservatives  pregnant or trying to get pregnant  breast-feeding How should I use this medicine? This medicine is for infusion into a vein. It is administered in a hospital or clinic by a specially trained health care professional. A special MedGuide will be given to you by the pharmacist with each prescription and refill. Be sure to read this information carefully each time. Talk to your pediatrician regarding the use of this medicine in children. This medicine is not approved for use in children. Overdosage: If you think you have taken too much of this medicine contact a poison control center or emergency room at once. NOTE: This medicine is only for you. Do not share this medicine with others. What if I miss a dose? It is important not to miss a dose. Call your doctor or health care professional if you are unable to keep an appointment. What  may interact with this medicine?  cisplatin  live virus vaccines This list may not describe all possible interactions. Give your health care provider a list of all the medicines, herbs, non-prescription drugs, or dietary supplements you use. Also tell them if you smoke, drink alcohol, or use illegal drugs. Some items may interact with your medicine. What should I watch for while using this medicine? Your condition will be monitored carefully while you are receiving this medicine. You may need blood work done while you are taking this medicine. This medicine can cause serious allergic reactions. To reduce your risk you may need to take medicine before treatment with this medicine. Take your medicine as directed. In some patients, this medicine may cause a serious brain infection that may cause death. If you have any problems seeing, thinking, speaking, walking, or standing, tell your healthcare professional right away. If you cannot reach your healthcare professional, urgently seek other source of medical care. Call your doctor or health care professional for advice if you get a fever, chills or sore throat, or other symptoms of a cold or flu. Do not treat yourself. This drug decreases your body's ability to fight infections. Try to avoid being around people who are sick. Do not become pregnant while taking this medicine or for at least 12 months after stopping it. Women should inform their doctor if they wish to become pregnant or think they might be pregnant. There is a potential for serious side effects to an unborn child. Talk to your health care professional or pharmacist for more information. Do not breast-feed an infant while taking this medicine or for at   least 6 months after stopping it. What side effects may I notice from receiving this medicine? Side effects that you should report to your doctor or health care professional as soon as possible:  allergic reactions like skin rash, itching or  hives; swelling of the face, lips, or tongue  breathing problems  chest pain  changes in vision  diarrhea  headache with fever, neck stiffness, sensitivity to light, nausea, or confusion  fast, irregular heartbeat  loss of memory  low blood counts - this medicine may decrease the number of white blood cells, red blood cells and platelets. You may be at increased risk for infections and bleeding.  mouth sores  problems with balance, talking, or walking  redness, blistering, peeling or loosening of the skin, including inside the mouth  signs of infection - fever or chills, cough, sore throat, pain or difficulty passing urine  signs and symptoms of kidney injury like trouble passing urine or change in the amount of urine  signs and symptoms of liver injury like dark yellow or brown urine; general ill feeling or flu-like symptoms; light-colored stools; loss of appetite; nausea; right upper belly pain; unusually weak or tired; yellowing of the eyes or skin  signs and symptoms of low blood pressure like dizziness; feeling faint or lightheaded, falls; unusually weak or tired  stomach pain  swelling of the ankles, feet, hands  unusual bleeding or bruising  vomiting Side effects that usually do not require medical attention (report to your doctor or health care professional if they continue or are bothersome):  headache  joint pain  muscle cramps or muscle pain  nausea  tiredness This list may not describe all possible side effects. Call your doctor for medical advice about side effects. You may report side effects to FDA at 1-800-FDA-1088. Where should I keep my medicine? This drug is given in a hospital or clinic and will not be stored at home. NOTE: This sheet is a summary. It may not cover all possible information. If you have questions about this medicine, talk to your doctor, pharmacist, or health care provider.  2020 Elsevier/Gold Standard (2018-03-21  22:01:36)  

## 2019-05-15 NOTE — Progress Notes (Signed)
Hematology and Oncology Follow Up Visit  Kristine Sullivan [id number] 1931/05/17 86 y.o. 05/15/2019   Principle Diagnosis:  Marginal zone lymphoma -- recurrent Iron deficiency anemia Autoimmune hemolytic anemia -- warm antibody  Past therapy: CVP -- completed 8 cycles on 04/09/2012 Maintenance Rituxan - 2 years completed in June 2016 IV iron - last dose on 06/2018  Current Therapy:   Rituxan/Cytoxan -- startedon 11/08/2018, s/p cycle 2, Cytoxan d/c'd 12/19/2018 Rituxan/Bendamustine -- s/p cycle #1 on 04/17/2019 IV Iron as needed   Interim History:  Kristine Sullivan is here today for follow-up.   She does look better.  Hopefully, she will continue to improve.  Her hemoglobin is 9.7 today.  With more important is that the reticulocyte count is now 6.2%.  She really has had no problems with the treatment.  She has had no nausea or vomiting.  There is been no diarrhea.  She has had no fever.  There is no cough.  She has had no bleeding.  She has had no increase shortness of breath.  There is still some fatigue.  Overall, her performance status is ECOG 2.  Medications:  Allergies as of 05/15/2019      Reactions   Lisinopril Swelling   Facial swelling    Pregabalin Other (See Comments)   constipation constipation constipation   Timolol Swelling, Other (See Comments)      Medication List       Accurate as of May 15, 2019  8:25 AM. If you have any questions, ask your nurse or doctor.        acyclovir 400 MG tablet Commonly known as: ZOVIRAX Take 1 tablet (400 mg total) by mouth daily.   allopurinol 300 MG tablet Commonly known as: ZYLOPRIM Take 1 tablet (300 mg total) by mouth daily.   amLODipine 5 MG tablet Commonly known as: NORVASC Take 5 mg by mouth daily.   apixaban 5 MG Tabs tablet Commonly known as: ELIQUIS Take 5 mg by mouth 2 (two) times daily.   aspirin 81 MG tablet Take 81 mg by mouth daily.   atorvastatin 80 MG tablet Commonly known as:  LIPITOR Take 40 mg by mouth daily.   Calcium 600-D 600-400 MG-UNIT Tabs Generic drug: Calcium Carbonate-Vitamin D3 Take by mouth.   dexamethasone 4 MG tablet Commonly known as: DECADRON Take 2 tablets (8 mg total) by mouth daily. Start the day after bendamustine chemotherapy for 2 days. Take with food.   diphenhydrAMINE 25 MG tablet Commonly known as: SOMINEX Take 25 mg by mouth daily.   dorzolamide 2 % ophthalmic solution Commonly known as: TRUSOPT Place 1 drop into both eyes 3 (three) times daily.   ferrous sulfate 325 (65 FE) MG tablet Take 325 mg by mouth daily.   folic acid 1 MG tablet Commonly known as: FOLVITE Take 1 tablet (1 mg total) by mouth daily.   furosemide 40 MG tablet Commonly known as: LASIX Take 40 mg by mouth daily.   ipratropium-albuterol 0.5-2.5 (3) MG/3ML Soln Commonly known as: DUONEB INHALE 3 ML BY NEBULIZATION EVERY SIX (6) HOURS AS NEEDED.   latanoprost 0.005 % ophthalmic solution Commonly known as: XALATAN Place 1 drop into both eyes at bedtime.   lidocaine-prilocaine cream Commonly known as: EMLA Apply to affected area once   loratadine 10 MG tablet Commonly known as: CLARITIN OTC Take by mouth one a day for 10 days, then once a day as needed -allergies.   LORazepam 0.5 MG tablet Commonly known as: Ativan Take 1  tablet (0.5 mg total) by mouth every 6 (six) hours as needed (Nausea or vomiting).   ondansetron 8 MG tablet Commonly known as: Zofran Take 1 tablet (8 mg total) by mouth 2 (two) times daily as needed for refractory nausea / vomiting. Start on day 2 after bendamustine chemo.   potassium chloride 10 MEQ tablet Commonly known as: KLOR-CON Take 10 mEq by mouth daily.   prochlorperazine 10 MG tablet Commonly known as: COMPAZINE Take 1 tablet (10 mg total) by mouth every 6 (six) hours as needed (Nausea or vomiting).   raloxifene 60 MG tablet Commonly known as: EVISTA Take 60 mg by mouth daily.   ranitidine 150 MG  tablet Commonly known as: ZANTAC 1 tablet by mouth twice a day for month and then take twice a day for needed - GERD   tiZANidine 4 MG capsule Commonly known as: ZANAFLEX Take 4 mg by mouth at bedtime.       Allergies:  Allergies  Allergen Reactions  . Lisinopril Swelling    Facial swelling     . Pregabalin Other (See Comments)    constipation  constipation constipation  . Timolol Swelling and Other (See Comments)    Past Medical History, Surgical history, Social history, and Family History were reviewed and updated.  Review of Systems: Review of Systems  Constitutional: Positive for malaise/fatigue.  HENT: Negative.   Eyes: Negative.   Respiratory: Negative.   Cardiovascular: Negative.   Gastrointestinal: Negative.   Genitourinary: Negative.   Musculoskeletal: Negative.   Skin: Negative.   Neurological: Negative.   Endo/Heme/Allergies: Negative.   Psychiatric/Behavioral: Negative.      Physical Exam:  vitals were not taken for this visit.   Wt Readings from Last 3 Encounters:  05/15/19 148 lb (67.1 kg)  04/08/19 157 lb (71.2 kg)  03/18/19 159 lb 12 oz (72.5 kg)    Physical Exam Vitals reviewed.  HENT:     Head: Normocephalic and atraumatic.  Eyes:     Pupils: Pupils are equal, round, and reactive to light.  Cardiovascular:     Rate and Rhythm: Normal rate and regular rhythm.     Heart sounds: Normal heart sounds.  Pulmonary:     Effort: Pulmonary effort is normal.     Breath sounds: Normal breath sounds.  Abdominal:     General: Bowel sounds are normal.     Palpations: Abdomen is soft.  Musculoskeletal:        General: No tenderness or deformity. Normal range of motion.     Cervical back: Normal range of motion.  Lymphadenopathy:     Cervical: No cervical adenopathy.  Skin:    General: Skin is warm and dry.     Findings: No erythema or rash.  Neurological:     Mental Status: She is alert and oriented to person, place, and time.   Psychiatric:        Behavior: Behavior normal.        Thought Content: Thought content normal.        Judgment: Judgment normal.      Lab Results  Component Value Date   WBC 2.8 (L) 05/06/2019   HGB 7.8 (L) 05/06/2019   HCT 26.8 (L) 05/06/2019   MCV 108.5 (H) 05/06/2019   PLT 82 (L) 05/06/2019   Lab Results  Component Value Date   FERRITIN 1,443 (H) 03/18/2019   IRON 95 03/18/2019   TIBC 296 03/18/2019   UIBC 201 03/18/2019   IRONPCTSAT 32 03/18/2019  Lab Results  Component Value Date   RETICCTPCT 11.4 (H) 05/06/2019   RBC 2.43 (L) 05/06/2019   RETICCTABS 59.2 11/13/2013   No results found for: Nils Pyle [facility] Lab Results  Component Value Date   IGGSERUM 557 (L) 06/19/2013   IGA 100 06/19/2013   IGMSERUM 95 06/19/2013   Lab Results  Component Value Date   TOTALPROTELP 6.3 09/16/2011   ALBUMINELP 59.9 09/16/2011   A1GS 7.3 (H) 09/16/2011   A2GS 9.9 09/16/2011   BETS 8.4 (H) 09/16/2011   BETA2SER 4.8 09/16/2011   GAMS 9.7 (L) 09/16/2011   MSPIKE NOT DET 09/16/2011   SPEI * 09/16/2011     Chemistry      Component Value Date/Time   NA 141 05/06/2019 1035   NA 140 09/16/2016 0945   K 4.0 05/06/2019 1035   K 3.2 (L) 09/16/2016 0945   CL 108 05/06/2019 1035   CL 101 12/08/2014 1138   CL 99 05/28/2012 1036   CO2 26 05/06/2019 1035   CO2 26 09/16/2016 0945   BUN 14 05/06/2019 1035   BUN 9.0 09/16/2016 0945   CREATININE 0.57 05/06/2019 1035   CREATININE 0.7 09/16/2016 0945      Component Value Date/Time   CALCIUM 8.5 (L) 05/06/2019 1035   CALCIUM 9.0 09/16/2016 0945   ALKPHOS 90 05/06/2019 1035   ALKPHOS 122 09/16/2016 0945   AST 21 05/06/2019 1035   AST 25 09/16/2016 0945   ALT 11 05/06/2019 1035   ALT 15 09/16/2016 0945   BILITOT 2.5 (H) 05/06/2019 1035   BILITOT 0.90 09/16/2016 0945       Impression and Plan: Kristine Sullivan is a very pleasant 86 yo African American female with a history of marginal zone lymphoma,  angioedema and autoimmune hemolytic anemia.  Clearly, the problem still exists hemolytic anemia.  I just am not sure how we can try to slow it down.  Hopefully, we are seeing the improvement with the Rituxan/bendamustine.  The reticulocyte count will usually tell us how things are going.  We will go ahead with her second cycle of treatment today.  I am glad that she is tolerating this well.  We will plan to get her back in another month.  By then, she will be 86 years old.  I just want her quality of life to be better.  [name], MD 3/24/20218:25 AM

## 2019-05-16 ENCOUNTER — Inpatient Hospital Stay: Payer: Medicare Other

## 2019-05-16 VITALS — BP 135/43 | HR 79 | Temp 97.7°F | Resp 17

## 2019-05-16 DIAGNOSIS — Z5111 Encounter for antineoplastic chemotherapy: Secondary | ICD-10-CM | POA: Diagnosis not present

## 2019-05-16 DIAGNOSIS — C8307 Small cell B-cell lymphoma, spleen: Secondary | ICD-10-CM

## 2019-05-16 MED ORDER — SODIUM CHLORIDE 0.9 % IV SOLN
Freq: Once | INTRAVENOUS | Status: AC
  Filled 2019-05-16: qty 250

## 2019-05-16 MED ORDER — HEPARIN SOD (PORK) LOCK FLUSH 100 UNIT/ML IV SOLN
500.0000 [IU] | Freq: Once | INTRAVENOUS | Status: AC | PRN
  Administered 2019-05-16: 500 [IU]
  Filled 2019-05-16: qty 5

## 2019-05-16 MED ORDER — DEXAMETHASONE SODIUM PHOSPHATE 10 MG/ML IJ SOLN
10.0000 mg | Freq: Once | INTRAMUSCULAR | Status: AC
  Administered 2019-05-16: 10 mg via INTRAVENOUS

## 2019-05-16 MED ORDER — DEXAMETHASONE SODIUM PHOSPHATE 10 MG/ML IJ SOLN
INTRAMUSCULAR | Status: AC
  Filled 2019-05-16: qty 1

## 2019-05-16 MED ORDER — SODIUM CHLORIDE 0.9 % IV SOLN
72.0000 mg/m2 | Freq: Once | INTRAVENOUS | Status: AC
  Administered 2019-05-16: 125 mg via INTRAVENOUS
  Filled 2019-05-16: qty 5

## 2019-05-16 MED ORDER — SODIUM CHLORIDE 0.9% FLUSH
10.0000 mL | INTRAVENOUS | Status: DC | PRN
  Administered 2019-05-16: 10 mL
  Filled 2019-05-16: qty 10

## 2019-05-16 NOTE — Patient Instructions (Signed)
Bendamustine Injection What is this medicine? BENDAMUSTINE (BEN da MUS teen) is a chemotherapy drug. It is used to treat chronic lymphocytic leukemia and non-Hodgkin lymphoma. This medicine may be used for other purposes; ask your health care provider or pharmacist if you have questions. COMMON BRAND NAME(S): BELRAPZO, BENDEKA, Treanda What should I tell my health care provider before I take this medicine? They need to know if you have any of these conditions:  infection (especially a virus infection such as chickenpox, cold sores, or herpes)  kidney disease  liver disease  an unusual or allergic reaction to bendamustine, mannitol, other medicines, foods, dyes, or preservatives  pregnant or trying to get pregnant  breast-feeding How should I use this medicine? This medicine is for infusion into a vein. It is given by a health care professional in a hospital or clinic setting. Talk to your pediatrician regarding the use of this medicine in children. Special care may be needed. Overdosage: If you think you have taken too much of this medicine contact a poison control center or emergency room at once. NOTE: This medicine is only for you. Do not share this medicine with others. What if I miss a dose? It is important not to miss your dose. Call your doctor or health care professional if you are unable to keep an appointment. What may interact with this medicine? Do not take this medicine with any of the following medications:  clozapine This medicine may also interact with the following medications:  atazanavir  cimetidine  ciprofloxacin  enoxacin  fluvoxamine  medicines for seizures like carbamazepine and phenobarbital  mexiletine  rifampin  tacrine  thiabendazole  zileuton This list may not describe all possible interactions. Give your health care provider a list of all the medicines, herbs, non-prescription drugs, or dietary supplements you use. Also tell them if  you smoke, drink alcohol, or use illegal drugs. Some items may interact with your medicine. What should I watch for while using this medicine? This drug may make you feel generally unwell. This is not uncommon, as chemotherapy can affect healthy cells as well as cancer cells. Report any side effects. Continue your course of treatment even though you feel ill unless your doctor tells you to stop. You may need blood work done while you are taking this medicine. Call your doctor or healthcare provider for advice if you get a fever, chills or sore throat, or other symptoms of a cold or flu. Do not treat yourself. This drug decreases your body's ability to fight infections. Try to avoid being around people who are sick. This medicine may cause serious skin reactions. They can happen weeks to months after starting the medicine. Contact your healthcare provider right away if you notice fevers or flu-like symptoms with a rash. The rash may be red or purple and then turn into blisters or peeling of the skin. Or, you might notice a red rash with swelling of the face, lips or lymph nodes in your neck or under your arms. This medicine may increase your risk to bruise or bleed. Call your doctor or healthcare provider if you notice any unusual bleeding. Talk to your doctor about your risk of cancer. You may be more at risk for certain types of cancers if you take this medicine. Do not become pregnant while taking this medicine or for at least 6 months after stopping it. Women should inform their doctor if they wish to become pregnant or think they might be pregnant. Men should not   father a child while taking this medicine and for at least 3 months after stopping it. There is a potential for serious side effects to an unborn child. Talk to your healthcare provider or pharmacist for more information. Do not breast-feed an infant while taking this medicine or for at least 1 week after stopping it. This medicine may make it  more difficult to father a child. You should talk with your doctor or healthcare provider if you are concerned about your fertility. What side effects may I notice from receiving this medicine? Side effects that you should report to your doctor or health care professional as soon as possible:  allergic reactions like skin rash, itching or hives, swelling of the face, lips, or tongue  low blood counts - this medicine may decrease the number of white blood cells, red blood cells and platelets. You may be at increased risk for infections and bleeding.  rash, fever, and swollen lymph nodes  redness, blistering, peeling, or loosening of the skin, including inside the mouth  signs of infection like fever or chills, cough, sore throat, pain or difficulty passing urine  signs of decreased platelets or bleeding like bruising, pinpoint red spots on the skin, black, tarry stools, blood in the urine  signs of decreased red blood cells like being unusually weak or tired, fainting spells, lightheadedness  signs and symptoms of kidney injury like trouble passing urine or change in the amount of urine  signs and symptoms of liver injury like dark yellow or brown urine; general ill feeling or flu-like symptoms; light-colored stools; loss of appetite; nausea; right upper belly pain; unusually weak or tired; yellowing of the eyes or skin Side effects that usually do not require medical attention (report to your doctor or health care professional if they continue or are bothersome):  constipation  decreased appetite  diarrhea  headache  mouth sores  nausea, vomiting  tiredness This list may not describe all possible side effects. Call your doctor for medical advice about side effects. You may report side effects to FDA at 1-800-FDA-1088. Where should I keep my medicine? This drug is given in a hospital or clinic and will not be stored at home. NOTE: This sheet is a summary. It may not cover all  possible information. If you have questions about this medicine, talk to your doctor, pharmacist, or health care provider.  2020 Elsevier/Gold Standard (2018-05-01 10:26:46)  

## 2019-06-04 ENCOUNTER — Other Ambulatory Visit: Payer: Self-pay | Admitting: Hematology & Oncology

## 2019-06-05 NOTE — Progress Notes (Signed)
Pharmacist Chemotherapy Monitoring - Follow Up Assessment    I verify that I have reviewed each item in the below checklist:  . Regimen for the patient is scheduled for the appropriate day and plan matches scheduled date. [name] Appropriate non-routine labs are ordered dependent on drug ordered. . If applicable, additional medications reviewed and ordered per protocol based on lifetime cumulative doses and/or treatment regimen.   Plan for follow-up and/or issues identified: No . I-vent associated with next due treatment: No . MD and/or nursing notified: No  Domingos Riggi, Jacqlyn Larsen 06/05/2019 8:00 AM

## 2019-06-06 NOTE — Progress Notes (Signed)
Pharmacist Chemotherapy Monitoring - Follow Up Assessment    I verify that I have reviewed each item in the below checklist:  . Regimen for the patient is scheduled for the appropriate day and plan matches scheduled date. [name] Appropriate non-routine labs are ordered dependent on drug ordered. . If applicable, additional medications reviewed and ordered per protocol based on lifetime cumulative doses and/or treatment regimen.   Plan for follow-up and/or issues identified: No . I-vent associated with next due treatment: No . MD and/or nursing notified: No  Alyric Parkin, Jacqlyn Larsen 06/06/2019 7:47 AM

## 2019-06-12 ENCOUNTER — Inpatient Hospital Stay: Payer: Medicare Other

## 2019-06-12 ENCOUNTER — Inpatient Hospital Stay: Payer: Medicare Other | Attending: Hematology & Oncology

## 2019-06-12 ENCOUNTER — Telehealth: Payer: Self-pay | Admitting: Hematology & Oncology

## 2019-06-12 ENCOUNTER — Encounter: Payer: Self-pay | Admitting: Hematology & Oncology

## 2019-06-12 ENCOUNTER — Inpatient Hospital Stay (HOSPITAL_BASED_OUTPATIENT_CLINIC_OR_DEPARTMENT_OTHER): Payer: Medicare Other | Admitting: Hematology & Oncology

## 2019-06-12 ENCOUNTER — Other Ambulatory Visit: Payer: Self-pay

## 2019-06-12 VITALS — BP 114/46 | HR 77 | Temp 96.6°F | Resp 17

## 2019-06-12 VITALS — BP 150/82 | HR 90 | Temp 96.6°F | Resp 19 | Wt 161.0 lb

## 2019-06-12 DIAGNOSIS — C8307 Small cell B-cell lymphoma, spleen: Secondary | ICD-10-CM | POA: Diagnosis not present

## 2019-06-12 DIAGNOSIS — R600 Localized edema: Secondary | ICD-10-CM

## 2019-06-12 DIAGNOSIS — D5911 Warm autoimmune hemolytic anemia: Secondary | ICD-10-CM

## 2019-06-12 DIAGNOSIS — Z5112 Encounter for antineoplastic immunotherapy: Secondary | ICD-10-CM | POA: Insufficient documentation

## 2019-06-12 DIAGNOSIS — C8300 Small cell B-cell lymphoma, unspecified site: Secondary | ICD-10-CM | POA: Diagnosis present

## 2019-06-12 DIAGNOSIS — E876 Hypokalemia: Secondary | ICD-10-CM

## 2019-06-12 LAB — IRON AND TIBC
Iron: 51 ug/dL (ref 41–142)
Saturation Ratios: 22 % (ref 21–57)
TIBC: 231 ug/dL — ABNORMAL LOW (ref 236–444)
UIBC: 181 ug/dL (ref 120–384)

## 2019-06-12 LAB — CBC WITH DIFFERENTIAL (CANCER CENTER ONLY)
Abs Immature Granulocytes: 0.12 10*3/uL — ABNORMAL HIGH (ref 0.00–0.07)
Basophils Absolute: 0 10*3/uL (ref 0.0–0.1)
Basophils Relative: 0 %
Eosinophils Absolute: 0.1 10*3/uL (ref 0.0–0.5)
Eosinophils Relative: 2 %
HCT: 32.1 % — ABNORMAL LOW (ref 36.0–46.0)
Hemoglobin: 9.6 g/dL — ABNORMAL LOW (ref 12.0–15.0)
Immature Granulocytes: 4 %
Lymphocytes Relative: 13 %
Lymphs Abs: 0.4 10*3/uL — ABNORMAL LOW (ref 0.7–4.0)
MCH: 31.6 pg (ref 26.0–34.0)
MCHC: 29.9 g/dL — ABNORMAL LOW (ref 30.0–36.0)
MCV: 105.6 fL — ABNORMAL HIGH (ref 80.0–100.0)
Monocytes Absolute: 0.2 10*3/uL (ref 0.1–1.0)
Monocytes Relative: 8 %
Neutro Abs: 2.4 10*3/uL (ref 1.7–7.7)
Neutrophils Relative %: 73 %
Platelet Count: 109 10*3/uL — ABNORMAL LOW (ref 150–400)
RBC: 3.04 MIL/uL — ABNORMAL LOW (ref 3.87–5.11)
RDW: 16.9 % — ABNORMAL HIGH (ref 11.5–15.5)
WBC Count: 3.2 10*3/uL — ABNORMAL LOW (ref 4.0–10.5)
nRBC: 0 % (ref 0.0–0.2)

## 2019-06-12 LAB — CMP (CANCER CENTER ONLY)
ALT: 24 U/L (ref 0–44)
AST: 32 U/L (ref 15–41)
Albumin: 3.5 g/dL (ref 3.5–5.0)
Alkaline Phosphatase: 107 U/L (ref 38–126)
Anion gap: 7 (ref 5–15)
BUN: 10 mg/dL (ref 8–23)
CO2: 29 mmol/L (ref 22–32)
Calcium: 8.7 mg/dL — ABNORMAL LOW (ref 8.9–10.3)
Chloride: 105 mmol/L (ref 98–111)
Creatinine: 0.55 mg/dL (ref 0.44–1.00)
GFR, Est AFR Am: >60 mL/min (ref >60)
GFR, Estimated: >60 mL/min (ref >60)
Glucose, Bld: 122 mg/dL — ABNORMAL HIGH (ref 70–99)
Potassium: 3.4 mmol/L — ABNORMAL LOW (ref 3.5–5.1)
Sodium: 141 mmol/L (ref 135–145)
Total Bilirubin: 1.6 mg/dL — ABNORMAL HIGH (ref 0.3–1.2)
Total Protein: 5.4 g/dL — ABNORMAL LOW (ref 6.5–8.1)

## 2019-06-12 LAB — RETICULOCYTES
Immature Retic Fract: 30.4 % — ABNORMAL HIGH (ref 2.3–15.9)
RBC.: 3.01 MIL/uL — ABNORMAL LOW (ref 3.87–5.11)
Retic Count, Absolute: 297.7 10*3/uL — ABNORMAL HIGH (ref 19.0–186.0)
Retic Ct Pct: 9.9 % — ABNORMAL HIGH (ref 0.4–3.1)

## 2019-06-12 LAB — FERRITIN: Ferritin: 2334 ng/mL — ABNORMAL HIGH (ref 11–307)

## 2019-06-12 LAB — LACTATE DEHYDROGENASE: LDH: 437 U/L — ABNORMAL HIGH (ref 98–192)

## 2019-06-12 LAB — SAMPLE TO BLOOD BANK

## 2019-06-12 MED ORDER — PALONOSETRON HCL INJECTION 0.25 MG/5ML
0.2500 mg | Freq: Once | INTRAVENOUS | Status: AC
  Administered 2019-06-12: 0.25 mg via INTRAVENOUS

## 2019-06-12 MED ORDER — ACETAMINOPHEN 325 MG PO TABS
ORAL_TABLET | ORAL | Status: AC
  Filled 2019-06-12: qty 2

## 2019-06-12 MED ORDER — SODIUM CHLORIDE 0.9 % IV SOLN
375.0000 mg/m2 | Freq: Once | INTRAVENOUS | Status: AC
  Administered 2019-06-12: 700 mg via INTRAVENOUS
  Filled 2019-06-12: qty 50

## 2019-06-12 MED ORDER — SODIUM CHLORIDE 0.9 % IV SOLN
10.0000 mg | Freq: Once | INTRAVENOUS | Status: AC
  Administered 2019-06-12: 10 mg via INTRAVENOUS
  Filled 2019-06-12: qty 10

## 2019-06-12 MED ORDER — FUROSEMIDE 10 MG/ML IJ SOLN: 40.0000 mg | Freq: Once | INTRAMUSCULAR | Status: DC

## 2019-06-12 MED ORDER — POTASSIUM CHLORIDE CRYS ER 20 MEQ PO TBCR
40.0000 meq | EXTENDED_RELEASE_TABLET | Freq: Two times a day (BID) | ORAL | Status: DC
  Administered 2019-06-12 (×2): 40 meq via ORAL
  Filled 2019-06-12: qty 2

## 2019-06-12 MED ORDER — PALONOSETRON HCL INJECTION 0.25 MG/5ML
INTRAVENOUS | Status: AC
  Filled 2019-06-12: qty 5

## 2019-06-12 MED ORDER — DIPHENHYDRAMINE HCL 25 MG PO CAPS
ORAL_CAPSULE | ORAL | Status: AC
  Filled 2019-06-12: qty 2

## 2019-06-12 MED ORDER — ACETAMINOPHEN 325 MG PO TABS
650.0000 mg | ORAL_TABLET | Freq: Once | ORAL | Status: AC
  Administered 2019-06-12: 650 mg via ORAL

## 2019-06-12 MED ORDER — SODIUM CHLORIDE 0.9 % IV SOLN
72.0000 mg/m2 | Freq: Once | INTRAVENOUS | Status: AC
  Administered 2019-06-12: 125 mg via INTRAVENOUS
  Filled 2019-06-12: qty 5

## 2019-06-12 MED ORDER — SODIUM CHLORIDE 0.9 % IV SOLN
Freq: Once | INTRAVENOUS | Status: AC
  Filled 2019-06-12: qty 250

## 2019-06-12 MED ORDER — SODIUM CHLORIDE 0.9% FLUSH
10.0000 mL | INTRAVENOUS | Status: DC | PRN
  Administered 2019-06-12: 10 mL
  Filled 2019-06-12: qty 10

## 2019-06-12 MED ORDER — DIPHENHYDRAMINE HCL 25 MG PO CAPS
50.0000 mg | ORAL_CAPSULE | Freq: Once | ORAL | Status: AC
  Administered 2019-06-12: 50 mg via ORAL

## 2019-06-12 MED ORDER — POTASSIUM CHLORIDE CRYS ER 20 MEQ PO TBCR
40.0000 meq | EXTENDED_RELEASE_TABLET | Freq: Two times a day (BID) | ORAL | Status: DC
  Filled 2019-06-12: qty 2

## 2019-06-12 MED ORDER — HEPARIN SOD (PORK) LOCK FLUSH 100 UNIT/ML IV SOLN
500.0000 [IU] | Freq: Once | INTRAVENOUS | Status: AC | PRN
  Administered 2019-06-12: 500 [IU]
  Filled 2019-06-12: qty 5

## 2019-06-12 MED ORDER — FUROSEMIDE 10 MG/ML IJ SOLN
INTRAMUSCULAR | Status: AC
  Filled 2019-06-12: qty 4

## 2019-06-12 MED ORDER — FUROSEMIDE 10 MG/ML IJ SOLN
40.0000 mg | Freq: Once | INTRAMUSCULAR | Status: AC
  Administered 2019-06-12: 40 mg via INTRAVENOUS

## 2019-06-12 NOTE — Telephone Encounter (Signed)
Appointments scheduled patient to get update from infusion per 4/21 los

## 2019-06-12 NOTE — Patient Instructions (Signed)
[place] Discharge Instructions for Patients Receiving Chemotherapy  Today you received the following chemotherapy agents Bendeka and Ruxience  To help prevent nausea and vomiting after your treatment, we encourage you to take your nausea medication as prescribed by MD.   If you develop nausea and vomiting that is not controlled by your nausea medication, call the clinic.   BELOW ARE SYMPTOMS THAT SHOULD BE REPORTED IMMEDIATELY:  *FEVER GREATER THAN 100.5 F  *CHILLS WITH OR WITHOUT FEVER  NAUSEA AND VOMITING THAT IS NOT CONTROLLED WITH YOUR NAUSEA MEDICATION  *UNUSUAL SHORTNESS OF BREATH  *UNUSUAL BRUISING OR BLEEDING  TENDERNESS IN MOUTH AND THROAT WITH OR WITHOUT PRESENCE OF ULCERS  *URINARY PROBLEMS  *BOWEL PROBLEMS  UNUSUAL RASH Items with * indicate a potential emergency and should be followed up as soon as possible.  Feel free to call the clinic should you have any questions or concerns. The clinic phone number is [phone].  Please show the [place] at check-in to the Emergency Department and triage nurse.

## 2019-06-12 NOTE — Progress Notes (Signed)
Hematology and Oncology Follow Up Visit  Kristine Sullivan [id number] [birth year] 86 y.o. 06/12/2019   Principle Diagnosis:  Marginal zone lymphoma -- recurrent Iron deficiency anemia Autoimmune hemolytic anemia -- warm antibody  Past therapy: CVP -- completed 8 cycles on 04/09/2012 Maintenance Rituxan - 2 years completed in June 2016 IV iron - last dose on 06/2018  Current Therapy:   Rituxan/Cytoxan -- startedon 11/08/2018, s/p cycle 2, Cytoxan d/c'd 12/19/2018 Rituxan/Bendamustine -- s/p cycle #2 -- started on 04/17/2019 IV Iron as needed   Interim History:  Kristine Sullivan is here today for follow-up.   She is doing okay.  She has some weight gain.  She has some more swelling in her feet.  She is taking some Lasix.  I think we probably have to give her a dose of IV Lasix in the office today.  Thankfully, she is able to celebrate her 88th birthday over the weekend.  Her family did a "drive-by" birthday party for her.  She is eating well.  She is having no problems with nausea or vomiting.  She has had no bleeding.  She has had no change in bowel or bladder habits.  There has been no rashes.  I went through her medication list today.  We took out medications that she really does not need.  Hopefully this will help simplify her life.  Overall, her performance status is ECOG 2.  Medications:  Allergies as of 06/12/2019      Reactions   Lisinopril Swelling   Facial swelling    Pregabalin Other (See Comments)   constipation constipation constipation   Timolol Swelling, Other (See Comments)      Medication List       Accurate as of June 12, 2019  8:50 AM. If you have any questions, ask your nurse or doctor.        acyclovir 400 MG tablet Commonly known as: ZOVIRAX Take 1 tablet (400 mg total) by mouth daily.   allopurinol 300 MG tablet Commonly known as: ZYLOPRIM Take 1 tablet (300 mg total) by mouth daily.   amLODipine 5 MG tablet Commonly known as:  NORVASC Take 5 mg by mouth daily.   apixaban 5 MG Tabs tablet Commonly known as: ELIQUIS Take 5 mg by mouth 2 (two) times daily.   aspirin 81 MG tablet Take 81 mg by mouth daily.   atorvastatin 80 MG tablet Commonly known as: LIPITOR Take 40 mg by mouth daily.   Calcium 600-D 600-400 MG-UNIT Tabs Generic drug: Calcium Carbonate-Vitamin D3 Take by mouth.   dexamethasone 4 MG tablet Commonly known as: DECADRON Take 2 tablets (8 mg total) by mouth daily. Start the day after bendamustine chemotherapy for 2 days. Take with food.   diphenhydrAMINE 25 MG tablet Commonly known as: SOMINEX Take 25 mg by mouth daily.   dorzolamide 2 % ophthalmic solution Commonly known as: TRUSOPT Place 1 drop into both eyes 3 (three) times daily.   ferrous sulfate 325 (65 FE) MG tablet Take 325 mg by mouth daily.   folic acid 1 MG tablet Commonly known as: FOLVITE Take 1 tablet (1 mg total) by mouth daily.   furosemide 40 MG tablet Commonly known as: LASIX Take 40 mg by mouth daily.   ipratropium-albuterol 0.5-2.5 (3) MG/3ML Soln Commonly known as: DUONEB INHALE 3 ML BY NEBULIZATION EVERY SIX (6) HOURS AS NEEDED.   latanoprost 0.005 % ophthalmic solution Commonly known as: XALATAN Place 1 drop into both eyes at bedtime.   lidocaine-prilocaine cream  Commonly known as: EMLA Apply to affected area once   loratadine 10 MG tablet Commonly known as: CLARITIN OTC Take by mouth one a day for 10 days, then once a day as needed -allergies.   LORazepam 0.5 MG tablet Commonly known as: Ativan Take 1 tablet (0.5 mg total) by mouth every 6 (six) hours as needed (Nausea or vomiting).   ondansetron 8 MG tablet Commonly known as: Zofran Take 1 tablet (8 mg total) by mouth 2 (two) times daily as needed for refractory nausea / vomiting. Start on day 2 after bendamustine chemo.   potassium chloride 10 MEQ tablet Commonly known as: KLOR-CON Take 10 mEq by mouth daily.   prochlorperazine 10 MG  tablet Commonly known as: COMPAZINE Take 1 tablet (10 mg total) by mouth every 6 (six) hours as needed (Nausea or vomiting).   raloxifene 60 MG tablet Commonly known as: EVISTA Take 60 mg by mouth daily.   ranitidine 150 MG tablet Commonly known as: ZANTAC 1 tablet by mouth twice a day for month and then take twice a day for needed - GERD   tiZANidine 4 MG capsule Commonly known as: ZANAFLEX Take 4 mg by mouth at bedtime.   Vitamin D (Ergocalciferol) 1.25 MG (50000 UNIT) Caps capsule Commonly known as: DRISDOL Take 50,000 Units by mouth once a week.       Allergies:  Allergies  Allergen Reactions  . Lisinopril Swelling    Facial swelling     . Pregabalin Other (See Comments)    constipation  constipation constipation  . Timolol Swelling and Other (See Comments)    Past Medical History, Surgical history, Social history, and Family History were reviewed and updated.  Review of Systems: Review of Systems  Constitutional: Positive for malaise/fatigue.  HENT: Negative.   Eyes: Negative.   Respiratory: Negative.   Cardiovascular: Negative.   Gastrointestinal: Negative.   Genitourinary: Negative.   Musculoskeletal: Negative.   Skin: Negative.   Neurological: Negative.   Endo/Heme/Allergies: Negative.   Psychiatric/Behavioral: Negative.      Physical Exam:  weight is 161 lb (73 kg). Her temporal temperature is 96.6 F (35.9 C) (abnormal). Her blood pressure is 150/82 (abnormal) and her pulse is 90. Her respiration is 19 and oxygen saturation is 95%.   Wt Readings from Last 3 Encounters:  06/12/19 161 lb (73 kg)  05/15/19 148 lb (67.1 kg)  04/08/19 157 lb (71.2 kg)    Physical Exam Vitals reviewed.  HENT:     Head: Normocephalic and atraumatic.  Eyes:     Pupils: Pupils are equal, round, and reactive to light.  Cardiovascular:     Rate and Rhythm: Normal rate and regular rhythm.     Heart sounds: Normal heart sounds.  Pulmonary:     Effort:  Pulmonary effort is normal.     Breath sounds: Normal breath sounds.  Abdominal:     General: Bowel sounds are normal.     Palpations: Abdomen is soft.  Musculoskeletal:        General: No tenderness or deformity. Normal range of motion.     Cervical back: Normal range of motion.  Lymphadenopathy:     Cervical: No cervical adenopathy.  Skin:    General: Skin is warm and dry.     Findings: No erythema or rash.  Neurological:     Mental Status: She is alert and oriented to person, place, and time.  Psychiatric:        Behavior: Behavior normal.  Thought Content: Thought content normal.        Judgment: Judgment normal.      Lab Results  Component Value Date   WBC 3.2 (L) 06/12/2019   HGB 9.6 (L) 06/12/2019   HCT 32.1 (L) 06/12/2019   MCV 105.6 (H) 06/12/2019   PLT 109 (L) 06/12/2019   Lab Results  Component Value Date   FERRITIN 3,510 (H) 05/15/2019   IRON 216 (H) 05/15/2019   TIBC 241 05/15/2019   UIBC 25 (L) 05/15/2019   IRONPCTSAT 90 (H) 05/15/2019   Lab Results  Component Value Date   RETICCTPCT 9.9 (H) 06/12/2019   RBC 3.04 (L) 06/12/2019   RBC 3.01 (L) 06/12/2019   RETICCTABS 59.2 11/13/2013   No results found for: Nils Pyle, [facility] Lab Results  Component Value Date   IGGSERUM 557 (L) 06/19/2013   IGA 100 06/19/2013   IGMSERUM 95 06/19/2013   Lab Results  Component Value Date   TOTALPROTELP 6.3 09/16/2011   ALBUMINELP 59.9 09/16/2011   A1GS 7.3 (H) 09/16/2011   A2GS 9.9 09/16/2011   BETS 8.4 (H) 09/16/2011   BETA2SER 4.8 09/16/2011   GAMS 9.7 (L) 09/16/2011   MSPIKE NOT DET 09/16/2011   SPEI * 09/16/2011     Chemistry      Component Value Date/Time   NA 141 05/15/2019 0800   NA 140 09/16/2016 0945   K 4.0 05/15/2019 0800   K 3.2 (L) 09/16/2016 0945   CL 106 05/15/2019 0800   CL 101 12/08/2014 1138   CL 99 05/28/2012 1036   CO2 28 05/15/2019 0800   CO2 26 09/16/2016 0945   BUN 14 05/15/2019 0800   BUN 9.0  09/16/2016 0945   CREATININE 0.58 05/15/2019 0800   CREATININE 0.7 09/16/2016 0945      Component Value Date/Time   CALCIUM 8.9 05/15/2019 0800   CALCIUM 9.0 09/16/2016 0945   ALKPHOS 114 05/15/2019 0800   ALKPHOS 122 09/16/2016 0945   AST 24 05/15/2019 0800   AST 25 09/16/2016 0945   ALT 18 05/15/2019 0800   ALT 15 09/16/2016 0945   BILITOT 1.9 (H) 05/15/2019 0800   BILITOT 0.90 09/16/2016 0945       Impression and Plan: Kristine Sullivan is a very pleasant 86 yo African American female with a history of marginal zone lymphoma, angioedema and autoimmune hemolytic anemia.  Clearly, the problem still exists hemolytic anemia.  Her reticulocyte count is up to almost 10%.  Thankfully, her hemoglobin is still about the same.  We will go ahead with the third cycle of treatment.  I do believe that things are doing okay right now just because her hemoglobin is holding steady.  We will plan to get her back in another 4 weeks.  We should get some IV Lasix along with some oral potassium in the office today.    [name], MD 4/21/20218:50 AM

## 2019-06-13 ENCOUNTER — Inpatient Hospital Stay: Payer: Medicare Other

## 2019-06-13 VITALS — BP 142/62 | HR 83 | Temp 97.1°F | Resp 18

## 2019-06-13 DIAGNOSIS — Z5112 Encounter for antineoplastic immunotherapy: Secondary | ICD-10-CM | POA: Diagnosis not present

## 2019-06-13 DIAGNOSIS — C8307 Small cell B-cell lymphoma, spleen: Secondary | ICD-10-CM

## 2019-06-13 MED ORDER — HEPARIN SOD (PORK) LOCK FLUSH 100 UNIT/ML IV SOLN
500.0000 [IU] | Freq: Once | INTRAVENOUS | Status: AC | PRN
  Administered 2019-06-13: 500 [IU]
  Filled 2019-06-13: qty 5

## 2019-06-13 MED ORDER — SODIUM CHLORIDE 0.9% FLUSH
10.0000 mL | INTRAVENOUS | Status: DC | PRN
  Administered 2019-06-13: 10 mL
  Filled 2019-06-13: qty 10

## 2019-06-13 MED ORDER — SODIUM CHLORIDE 0.9 % IV SOLN
10.0000 mg | Freq: Once | INTRAVENOUS | Status: AC
  Administered 2019-06-13: 10 mg via INTRAVENOUS
  Filled 2019-06-13: qty 10

## 2019-06-13 MED ORDER — SODIUM CHLORIDE 0.9 % IV SOLN
Freq: Once | INTRAVENOUS | Status: AC
  Filled 2019-06-13: qty 250

## 2019-06-13 MED ORDER — SODIUM CHLORIDE 0.9 % IV SOLN
72.0000 mg/m2 | Freq: Once | INTRAVENOUS | Status: AC
  Administered 2019-06-13: 125 mg via INTRAVENOUS
  Filled 2019-06-13: qty 5

## 2019-06-13 NOTE — Patient Instructions (Signed)
Bendamustine Injection What is this medicine? BENDAMUSTINE (BEN da MUS teen) is a chemotherapy drug. It is used to treat chronic lymphocytic leukemia and non-Hodgkin lymphoma. This medicine may be used for other purposes; ask your health care provider or pharmacist if you have questions. COMMON BRAND NAME(S): BELRAPZO, BENDEKA, Treanda What should I tell my health care provider before I take this medicine? They need to know if you have any of these conditions:  infection (especially a virus infection such as chickenpox, cold sores, or herpes)  kidney disease  liver disease  an unusual or allergic reaction to bendamustine, mannitol, other medicines, foods, dyes, or preservatives  pregnant or trying to get pregnant  breast-feeding How should I use this medicine? This medicine is for infusion into a vein. It is given by a health care professional in a hospital or clinic setting. Talk to your pediatrician regarding the use of this medicine in children. Special care may be needed. Overdosage: If you think you have taken too much of this medicine contact a poison control center or emergency room at once. NOTE: This medicine is only for you. Do not share this medicine with others. What if I miss a dose? It is important not to miss your dose. Call your doctor or health care professional if you are unable to keep an appointment. What may interact with this medicine? Do not take this medicine with any of the following medications:  clozapine This medicine may also interact with the following medications:  atazanavir  cimetidine  ciprofloxacin  enoxacin  fluvoxamine  medicines for seizures like carbamazepine and phenobarbital  mexiletine  rifampin  tacrine  thiabendazole  zileuton This list may not describe all possible interactions. Give your health care provider a list of all the medicines, herbs, non-prescription drugs, or dietary supplements you use. Also tell them if  you smoke, drink alcohol, or use illegal drugs. Some items may interact with your medicine. What should I watch for while using this medicine? This drug may make you feel generally unwell. This is not uncommon, as chemotherapy can affect healthy cells as well as cancer cells. Report any side effects. Continue your course of treatment even though you feel ill unless your doctor tells you to stop. You may need blood work done while you are taking this medicine. Call your doctor or healthcare provider for advice if you get a fever, chills or sore throat, or other symptoms of a cold or flu. Do not treat yourself. This drug decreases your body's ability to fight infections. Try to avoid being around people who are sick. This medicine may cause serious skin reactions. They can happen weeks to months after starting the medicine. Contact your healthcare provider right away if you notice fevers or flu-like symptoms with a rash. The rash may be red or purple and then turn into blisters or peeling of the skin. Or, you might notice a red rash with swelling of the face, lips or lymph nodes in your neck or under your arms. This medicine may increase your risk to bruise or bleed. Call your doctor or healthcare provider if you notice any unusual bleeding. Talk to your doctor about your risk of cancer. You may be more at risk for certain types of cancers if you take this medicine. Do not become pregnant while taking this medicine or for at least 6 months after stopping it. Women should inform their doctor if they wish to become pregnant or think they might be pregnant. Men should not   father a child while taking this medicine and for at least 3 months after stopping it. There is a potential for serious side effects to an unborn child. Talk to your healthcare provider or pharmacist for more information. Do not breast-feed an infant while taking this medicine or for at least 1 week after stopping it. This medicine may make it  more difficult to father a child. You should talk with your doctor or healthcare provider if you are concerned about your fertility. What side effects may I notice from receiving this medicine? Side effects that you should report to your doctor or health care professional as soon as possible:  allergic reactions like skin rash, itching or hives, swelling of the face, lips, or tongue  low blood counts - this medicine may decrease the number of white blood cells, red blood cells and platelets. You may be at increased risk for infections and bleeding.  rash, fever, and swollen lymph nodes  redness, blistering, peeling, or loosening of the skin, including inside the mouth  signs of infection like fever or chills, cough, sore throat, pain or difficulty passing urine  signs of decreased platelets or bleeding like bruising, pinpoint red spots on the skin, black, tarry stools, blood in the urine  signs of decreased red blood cells like being unusually weak or tired, fainting spells, lightheadedness  signs and symptoms of kidney injury like trouble passing urine or change in the amount of urine  signs and symptoms of liver injury like dark yellow or brown urine; general ill feeling or flu-like symptoms; light-colored stools; loss of appetite; nausea; right upper belly pain; unusually weak or tired; yellowing of the eyes or skin Side effects that usually do not require medical attention (report to your doctor or health care professional if they continue or are bothersome):  constipation  decreased appetite  diarrhea  headache  mouth sores  nausea, vomiting  tiredness This list may not describe all possible side effects. Call your doctor for medical advice about side effects. You may report side effects to FDA at 1-800-FDA-1088. Where should I keep my medicine? This drug is given in a hospital or clinic and will not be stored at home. NOTE: This sheet is a summary. It may not cover all  possible information. If you have questions about this medicine, talk to your doctor, pharmacist, or health care provider.  2020 Elsevier/Gold Standard (2018-05-01 10:26:46)  

## 2019-07-04 NOTE — Progress Notes (Signed)
Pharmacist Chemotherapy Monitoring - Follow Up Assessment    I verify that I have reviewed each item in the below checklist:  . Regimen for the patient is scheduled for the appropriate day and plan matches scheduled date. [name] Appropriate non-routine labs are ordered dependent on drug ordered. . If applicable, additional medications reviewed and ordered per protocol based on lifetime cumulative doses and/or treatment regimen.   Plan for follow-up and/or issues identified: No . I-vent associated with next due treatment: No . MD and/or nursing notified: No  Acquanetta Belling 07/04/2019 4:12 PM

## 2019-07-10 ENCOUNTER — Inpatient Hospital Stay: Payer: Medicare Other

## 2019-07-10 ENCOUNTER — Other Ambulatory Visit: Payer: Self-pay

## 2019-07-10 ENCOUNTER — Inpatient Hospital Stay (HOSPITAL_BASED_OUTPATIENT_CLINIC_OR_DEPARTMENT_OTHER): Payer: Medicare Other | Admitting: Family

## 2019-07-10 ENCOUNTER — Telehealth: Payer: Self-pay | Admitting: Family

## 2019-07-10 ENCOUNTER — Encounter: Payer: Self-pay | Admitting: Family

## 2019-07-10 ENCOUNTER — Inpatient Hospital Stay: Payer: Medicare Other | Attending: Hematology & Oncology

## 2019-07-10 VITALS — BP 118/51 | HR 97 | Temp 97.1°F | Resp 16 | Wt 166.1 lb

## 2019-07-10 DIAGNOSIS — C8300 Small cell B-cell lymphoma, unspecified site: Secondary | ICD-10-CM | POA: Diagnosis present

## 2019-07-10 DIAGNOSIS — G629 Polyneuropathy, unspecified: Secondary | ICD-10-CM | POA: Insufficient documentation

## 2019-07-10 DIAGNOSIS — R5383 Other fatigue: Secondary | ICD-10-CM | POA: Diagnosis not present

## 2019-07-10 DIAGNOSIS — Z79899 Other long term (current) drug therapy: Secondary | ICD-10-CM

## 2019-07-10 DIAGNOSIS — D509 Iron deficiency anemia, unspecified: Secondary | ICD-10-CM | POA: Insufficient documentation

## 2019-07-10 DIAGNOSIS — M7989 Other specified soft tissue disorders: Secondary | ICD-10-CM | POA: Insufficient documentation

## 2019-07-10 DIAGNOSIS — R0602 Shortness of breath: Secondary | ICD-10-CM | POA: Insufficient documentation

## 2019-07-10 DIAGNOSIS — Z5181 Encounter for therapeutic drug level monitoring: Secondary | ICD-10-CM | POA: Diagnosis not present

## 2019-07-10 DIAGNOSIS — Z5111 Encounter for antineoplastic chemotherapy: Secondary | ICD-10-CM | POA: Diagnosis present

## 2019-07-10 DIAGNOSIS — Z5112 Encounter for antineoplastic immunotherapy: Secondary | ICD-10-CM | POA: Diagnosis present

## 2019-07-10 DIAGNOSIS — D5911 Warm autoimmune hemolytic anemia: Secondary | ICD-10-CM | POA: Insufficient documentation

## 2019-07-10 DIAGNOSIS — C8307 Small cell B-cell lymphoma, spleen: Secondary | ICD-10-CM

## 2019-07-10 DIAGNOSIS — D500 Iron deficiency anemia secondary to blood loss (chronic): Secondary | ICD-10-CM

## 2019-07-10 DIAGNOSIS — R600 Localized edema: Secondary | ICD-10-CM

## 2019-07-10 LAB — CMP (CANCER CENTER ONLY)
ALT: 22 U/L (ref 0–44)
AST: 28 U/L (ref 15–41)
Albumin: 3.6 g/dL (ref 3.5–5.0)
Alkaline Phosphatase: 103 U/L (ref 38–126)
Anion gap: 6 (ref 5–15)
BUN: 9 mg/dL (ref 8–23)
CO2: 30 mmol/L (ref 22–32)
Calcium: 8.7 mg/dL — ABNORMAL LOW (ref 8.9–10.3)
Chloride: 105 mmol/L (ref 98–111)
Creatinine: 0.65 mg/dL (ref 0.44–1.00)
GFR, Est AFR Am: >60 mL/min (ref >60)
GFR, Estimated: >60 mL/min (ref >60)
Glucose, Bld: 132 mg/dL — ABNORMAL HIGH (ref 70–99)
Potassium: 3.9 mmol/L (ref 3.5–5.1)
Sodium: 141 mmol/L (ref 135–145)
Total Bilirubin: 1.1 mg/dL (ref 0.3–1.2)
Total Protein: 5.6 g/dL — ABNORMAL LOW (ref 6.5–8.1)

## 2019-07-10 LAB — CBC WITH DIFFERENTIAL (CANCER CENTER ONLY)
Abs Immature Granulocytes: 0.04 10*3/uL (ref 0.00–0.07)
Basophils Absolute: 0 10*3/uL (ref 0.0–0.1)
Basophils Relative: 0 %
Eosinophils Absolute: 0 10*3/uL (ref 0.0–0.5)
Eosinophils Relative: 2 %
HCT: 35.3 % — ABNORMAL LOW (ref 36.0–46.0)
Hemoglobin: 10.6 g/dL — ABNORMAL LOW (ref 12.0–15.0)
Immature Granulocytes: 2 %
Lymphocytes Relative: 10 %
Lymphs Abs: 0.3 10*3/uL — ABNORMAL LOW (ref 0.7–4.0)
MCH: 31 pg (ref 26.0–34.0)
MCHC: 30 g/dL (ref 30.0–36.0)
MCV: 103.2 fL — ABNORMAL HIGH (ref 80.0–100.0)
Monocytes Absolute: 0.3 10*3/uL (ref 0.1–1.0)
Monocytes Relative: 10 %
Neutro Abs: 2.1 10*3/uL (ref 1.7–7.7)
Neutrophils Relative %: 76 %
Platelet Count: 87 10*3/uL — ABNORMAL LOW (ref 150–400)
RBC: 3.42 MIL/uL — ABNORMAL LOW (ref 3.87–5.11)
RDW: 15.8 % — ABNORMAL HIGH (ref 11.5–15.5)
WBC Count: 2.8 10*3/uL — ABNORMAL LOW (ref 4.0–10.5)
nRBC: 0 % (ref 0.0–0.2)

## 2019-07-10 LAB — RETICULOCYTES
Immature Retic Fract: 25.5 % — ABNORMAL HIGH (ref 2.3–15.9)
RBC.: 3.44 MIL/uL — ABNORMAL LOW (ref 3.87–5.11)
Retic Count, Absolute: 169.2 10*3/uL (ref 19.0–186.0)
Retic Ct Pct: 4.9 % — ABNORMAL HIGH (ref 0.4–3.1)

## 2019-07-10 LAB — LACTATE DEHYDROGENASE: LDH: 349 U/L — ABNORMAL HIGH (ref 98–192)

## 2019-07-10 LAB — SAMPLE TO BLOOD BANK

## 2019-07-10 LAB — SAVE SMEAR(SSMR), FOR PROVIDER SLIDE REVIEW

## 2019-07-10 LAB — HEPATITIS B CORE ANTIBODY, TOTAL: Hep B Core Total Ab: NONREACTIVE

## 2019-07-10 MED ORDER — SODIUM CHLORIDE 0.9% FLUSH
10.0000 mL | INTRAVENOUS | Status: DC | PRN
  Administered 2019-07-10: 10 mL
  Filled 2019-07-10: qty 10

## 2019-07-10 MED ORDER — ACETAMINOPHEN 325 MG PO TABS
ORAL_TABLET | ORAL | Status: AC
  Filled 2019-07-10: qty 2

## 2019-07-10 MED ORDER — SODIUM CHLORIDE 0.9 % IV SOLN
10.0000 mg | Freq: Once | INTRAVENOUS | Status: AC
  Administered 2019-07-10: 10 mg via INTRAVENOUS
  Filled 2019-07-10: qty 10

## 2019-07-10 MED ORDER — HEPARIN SOD (PORK) LOCK FLUSH 100 UNIT/ML IV SOLN
500.0000 [IU] | Freq: Once | INTRAVENOUS | Status: AC | PRN
  Administered 2019-07-10: 500 [IU]
  Filled 2019-07-10: qty 5

## 2019-07-10 MED ORDER — ACETAMINOPHEN 325 MG PO TABS
650.0000 mg | ORAL_TABLET | Freq: Once | ORAL | Status: AC
  Administered 2019-07-10: 650 mg via ORAL

## 2019-07-10 MED ORDER — DIPHENHYDRAMINE HCL 25 MG PO CAPS
50.0000 mg | ORAL_CAPSULE | Freq: Once | ORAL | Status: AC
  Administered 2019-07-10: 50 mg via ORAL

## 2019-07-10 MED ORDER — PALONOSETRON HCL INJECTION 0.25 MG/5ML
0.2500 mg | Freq: Once | INTRAVENOUS | Status: AC
  Administered 2019-07-10: 0.25 mg via INTRAVENOUS

## 2019-07-10 MED ORDER — SODIUM CHLORIDE 0.9 % IV SOLN
Freq: Once | INTRAVENOUS | Status: AC
  Filled 2019-07-10: qty 250

## 2019-07-10 MED ORDER — SODIUM CHLORIDE 0.9 % IV SOLN
375.0000 mg/m2 | Freq: Once | INTRAVENOUS | Status: AC
  Administered 2019-07-10: 700 mg via INTRAVENOUS
  Filled 2019-07-10: qty 50

## 2019-07-10 MED ORDER — SODIUM CHLORIDE 0.9 % IV SOLN
72.0000 mg/m2 | Freq: Once | INTRAVENOUS | Status: AC
  Administered 2019-07-10: 125 mg via INTRAVENOUS
  Filled 2019-07-10: qty 5

## 2019-07-10 MED ORDER — PALONOSETRON HCL INJECTION 0.25 MG/5ML
INTRAVENOUS | Status: AC
  Filled 2019-07-10: qty 5

## 2019-07-10 MED ORDER — DIPHENHYDRAMINE HCL 25 MG PO CAPS
ORAL_CAPSULE | ORAL | Status: AC
  Filled 2019-07-10: qty 2

## 2019-07-10 NOTE — Telephone Encounter (Signed)
Appointments scheduled calendar printed per 5/19 los

## 2019-07-10 NOTE — Progress Notes (Signed)
Ok to treat with platelets of 87 & ANC of 0.3 per Judson Roch, NP. dph

## 2019-07-10 NOTE — Patient Instructions (Signed)

## 2019-07-10 NOTE — Patient Instructions (Signed)
Bendamustine Injection What is this medicine? BENDAMUSTINE (BEN da MUS teen) is a chemotherapy drug. It is used to treat chronic lymphocytic leukemia and non-Hodgkin lymphoma. This medicine may be used for other purposes; ask your health care provider or pharmacist if you have questions. COMMON BRAND NAME(S): Kristine Royal, Treanda What should I tell my health care provider before I take this medicine? They need to know if you have any of these conditions:  infection (especially a virus infection such as chickenpox, cold sores, or herpes)  kidney disease  liver disease  an unusual or allergic reaction to bendamustine, mannitol, other medicines, foods, dyes, or preservatives  pregnant or trying to get pregnant  breast-feeding How should I use this medicine? This medicine is for infusion into a vein. It is given by a health care professional in a hospital or clinic setting. Talk to your pediatrician regarding the use of this medicine in children. Special care may be needed. Overdosage: If you think you have taken too much of this medicine contact a poison control center or emergency room at once. NOTE: This medicine is only for you. Do not share this medicine with others. What if I miss a dose? It is important not to miss your dose. Call your doctor or health care professional if you are unable to keep an appointment. What may interact with this medicine? Do not take this medicine with any of the following medications:  clozapine This medicine may also interact with the following medications:  atazanavir  cimetidine  ciprofloxacin  enoxacin  fluvoxamine  medicines for seizures like carbamazepine and phenobarbital  mexiletine  rifampin  tacrine  thiabendazole  zileuton This list may not describe all possible interactions. Give your health care provider a list of all the medicines, herbs, non-prescription drugs, or dietary supplements you use. Also tell them if  you smoke, drink alcohol, or use illegal drugs. Some items may interact with your medicine. What should I watch for while using this medicine? This drug may make you feel generally unwell. This is not uncommon, as chemotherapy can affect healthy cells as well as cancer cells. Report any side effects. Continue your course of treatment even though you feel ill unless your doctor tells you to stop. You may need blood work done while you are taking this medicine. Call your doctor or healthcare provider for advice if you get a fever, chills or sore throat, or other symptoms of a cold or flu. Do not treat yourself. This drug decreases your body's ability to fight infections. Try to avoid being around people who are sick. This medicine may cause serious skin reactions. They can happen weeks to months after starting the medicine. Contact your healthcare provider right away if you notice fevers or flu-like symptoms with a rash. The rash may be red or purple and then turn into blisters or peeling of the skin. Or, you might notice a red rash with swelling of the face, lips or lymph nodes in your neck or under your arms. This medicine may increase your risk to bruise or bleed. Call your doctor or healthcare provider if you notice any unusual bleeding. Talk to your doctor about your risk of cancer. You may be more at risk for certain types of cancers if you take this medicine. Do not become pregnant while taking this medicine or for at least 6 months after stopping it. Women should inform their doctor if they wish to become pregnant or think they might be pregnant. Men should not  father a child while taking this medicine and for at least 3 months after stopping it. There is a potential for serious side effects to an unborn child. Talk to your healthcare provider or pharmacist for more information. Do not breast-feed an infant while taking this medicine or for at least 1 week after stopping it. This medicine may make it  more difficult to father a child. You should talk with your doctor or healthcare provider if you are concerned about your fertility. What side effects may I notice from receiving this medicine? Side effects that you should report to your doctor or health care professional as soon as possible:  allergic reactions like skin rash, itching or hives, swelling of the face, lips, or tongue  low blood counts - this medicine may decrease the number of white blood cells, red blood cells and platelets. You may be at increased risk for infections and bleeding.  rash, fever, and swollen lymph nodes  redness, blistering, peeling, or loosening of the skin, including inside the mouth  signs of infection like fever or chills, cough, sore throat, pain or difficulty passing urine  signs of decreased platelets or bleeding like bruising, pinpoint red spots on the skin, black, tarry stools, blood in the urine  signs of decreased red blood cells like being unusually weak or tired, fainting spells, lightheadedness  signs and symptoms of kidney injury like trouble passing urine or change in the amount of urine  signs and symptoms of liver injury like dark yellow or brown urine; general ill feeling or flu-like symptoms; light-colored stools; loss of appetite; nausea; right upper belly pain; unusually weak or tired; yellowing of the eyes or skin Side effects that usually do not require medical attention (report to your doctor or health care professional if they continue or are bothersome):  constipation  decreased appetite  diarrhea  headache  mouth sores  nausea, vomiting  tiredness This list may not describe all possible side effects. Call your doctor for medical advice about side effects. You may report side effects to FDA at 1-800-FDA-1088. Where should I keep my medicine? This drug is given in a hospital or clinic and will not be stored at home. NOTE: This sheet is a summary. It may not cover all  possible information. If you have questions about this medicine, talk to your doctor, pharmacist, or health care provider.  2020 Elsevier/Gold Standard (2018-05-01 10:26:46) Rituximab injection What is this medicine? RITUXIMAB (ri TUX i mab) is a monoclonal antibody. It is used to treat certain types of cancer like non-Hodgkin lymphoma and chronic lymphocytic leukemia. It is also used to treat rheumatoid arthritis, granulomatosis with polyangiitis (or Wegener's granulomatosis), microscopic polyangiitis, and pemphigus vulgaris. This medicine may be used for other purposes; ask your health care provider or pharmacist if you have questions. COMMON BRAND NAME(S): Rituxan, RUXIENCE What should I tell my health care provider before I take this medicine? They need to know if you have any of these conditions:  heart disease  infection (especially a virus infection such as hepatitis B, chickenpox, cold sores, or herpes)  immune system problems  irregular heartbeat  kidney disease  low blood counts, like low white cell, platelet, or red cell counts  lung or breathing disease, like asthma  recently received or scheduled to receive a vaccine  an unusual or allergic reaction to rituximab, other medicines, foods, dyes, or preservatives  pregnant or trying to get pregnant  breast-feeding How should I use this medicine? This medicine is  for infusion into a vein. It is administered in a hospital or clinic by a specially trained health care professional. A special MedGuide will be given to you by the pharmacist with each prescription and refill. Be sure to read this information carefully each time. Talk to your pediatrician regarding the use of this medicine in children. This medicine is not approved for use in children. Overdosage: If you think you have taken too much of this medicine contact a poison control center or emergency room at once. NOTE: This medicine is only for you. Do not share this  medicine with others. What if I miss a dose? It is important not to miss a dose. Call your doctor or health care professional if you are unable to keep an appointment. What may interact with this medicine?  cisplatin  live virus vaccines This list may not describe all possible interactions. Give your health care provider a list of all the medicines, herbs, non-prescription drugs, or dietary supplements you use. Also tell them if you smoke, drink alcohol, or use illegal drugs. Some items may interact with your medicine. What should I watch for while using this medicine? Your condition will be monitored carefully while you are receiving this medicine. You may need blood work done while you are taking this medicine. This medicine can cause serious allergic reactions. To reduce your risk you may need to take medicine before treatment with this medicine. Take your medicine as directed. In some patients, this medicine may cause a serious brain infection that may cause death. If you have any problems seeing, thinking, speaking, walking, or standing, tell your healthcare professional right away. If you cannot reach your healthcare professional, urgently seek other source of medical care. Call your doctor or health care professional for advice if you get a fever, chills or sore throat, or other symptoms of a cold or flu. Do not treat yourself. This drug decreases your body's ability to fight infections. Try to avoid being around people who are sick. Do not become pregnant while taking this medicine or for at least 12 months after stopping it. Women should inform their doctor if they wish to become pregnant or think they might be pregnant. There is a potential for serious side effects to an unborn child. Talk to your health care professional or pharmacist for more information. Do not breast-feed an infant while taking this medicine or for at least 6 months after stopping it. What side effects may I notice from  receiving this medicine? Side effects that you should report to your doctor or health care professional as soon as possible:  allergic reactions like skin rash, itching or hives; swelling of the face, lips, or tongue  breathing problems  chest pain  changes in vision  diarrhea  headache with fever, neck stiffness, sensitivity to light, nausea, or confusion  fast, irregular heartbeat  loss of memory  low blood counts - this medicine may decrease the number of white blood cells, red blood cells and platelets. You may be at increased risk for infections and bleeding.  mouth sores  problems with balance, talking, or walking  redness, blistering, peeling or loosening of the skin, including inside the mouth  signs of infection - fever or chills, cough, sore throat, pain or difficulty passing urine  signs and symptoms of kidney injury like trouble passing urine or change in the amount of urine  signs and symptoms of liver injury like dark yellow or brown urine; general ill feeling or flu-like  symptoms; light-colored stools; loss of appetite; nausea; right upper belly pain; unusually weak or tired; yellowing of the eyes or skin  signs and symptoms of low blood pressure like dizziness; feeling faint or lightheaded, falls; unusually weak or tired  stomach pain  swelling of the ankles, feet, hands  unusual bleeding or bruising  vomiting Side effects that usually do not require medical attention (report to your doctor or health care professional if they continue or are bothersome):  headache  joint pain  muscle cramps or muscle pain  nausea  tiredness This list may not describe all possible side effects. Call your doctor for medical advice about side effects. You may report side effects to FDA at 1-800-FDA-1088. Where should I keep my medicine? This drug is given in a hospital or clinic and will not be stored at home. NOTE: This sheet is a summary. It may not cover all  possible information. If you have questions about this medicine, talk to your doctor, pharmacist, or health care provider.  2020 Elsevier/Gold Standard (2018-03-21 22:01:36)  

## 2019-07-10 NOTE — Progress Notes (Signed)
Hepatitis B labs being drawn today. Do not need to wait on results for treatment.   Hardie Pulley, PharmD, BCPS, BCOP

## 2019-07-10 NOTE — Progress Notes (Signed)
Hematology and Oncology Follow Up Visit  Kristine Sullivan [id number] 30-Sep-1931 86 y.o. 07/10/2019   Principle Diagnosis:  Marginal zone lymphoma -- recurrent Iron deficiency anemia Autoimmune hemolytic anemia -- warm antibody  Past therapy: CVP -- completed 8 cycles on 04/09/2012 Maintenance Rituxan - 2 years completed in June 2016 Rituxan/Cytoxan -- startedon 11/08/2018, s/p cycle 2, Cytoxan d/c'd 12/19/2018  Current Therapy:        Rituxan/Bendamustine - started on 04/17/2019, s/p cycle 3 IV Iron as needed  Interim History:  [name] is here today for follow-up and treatment. She is doing well but has some fatigue at times.  She has mild SOB with over exertion and will take a break to rest when needed.  No fever, chills, n/v, cough, rash, dizziness, chest pain, palpitations, abdominal pain or changes in bowel or bladder habits.  The swelling in her feet and ankles waxes and wanes. It resolves throughout the night and then comes back during the day.  She is walking some for exercise. Unfortunately, the family member helping her with things around her home and yard passed away and she has had to start doing them herself.  The neuropathy in her hands is stable/unchanged.  No falls or syncopal episodes to report.  She states that she does not have much of an appetite but that she is still eating. Her weight is stable.   ECOG Performance Status: 1 - Symptomatic but completely ambulatory  Medications:  Allergies as of 07/10/2019      Reactions   Lisinopril Swelling   Facial swelling    Pregabalin Other (See Comments)   constipation constipation constipation   Timolol Swelling, Other (See Comments)      Medication List       Accurate as of Jul 10, 2019  8:35 AM. If you have any questions, ask your nurse or doctor.        acyclovir 400 MG tablet Commonly known as: ZOVIRAX Take 1 tablet (400 mg total) by mouth daily.   allopurinol 300 MG tablet Commonly known  as: ZYLOPRIM Take 1 tablet (300 mg total) by mouth daily.   amLODipine 5 MG tablet Commonly known as: NORVASC Take 5 mg by mouth daily.   apixaban 5 MG Tabs tablet Commonly known as: ELIQUIS Take 5 mg by mouth 2 (two) times daily.   aspirin 81 MG tablet Take 81 mg by mouth daily.   atorvastatin 80 MG tablet Commonly known as: LIPITOR Take 40 mg by mouth daily.   Calcium 600-D 600-400 MG-UNIT Tabs Generic drug: Calcium Carbonate-Vitamin D3 Take by mouth.   dexamethasone 4 MG tablet Commonly known as: DECADRON Take 2 tablets (8 mg total) by mouth daily. Start the day after bendamustine chemotherapy for 2 days. Take with food.   diphenhydrAMINE 25 MG tablet Commonly known as: SOMINEX Take 25 mg by mouth daily.   dorzolamide 2 % ophthalmic solution Commonly known as: TRUSOPT Place 1 drop into both eyes 3 (three) times daily.   folic acid 1 MG tablet Commonly known as: FOLVITE Take 1 tablet (1 mg total) by mouth daily.   furosemide 40 MG tablet Commonly known as: LASIX Take 40 mg by mouth daily.   ipratropium-albuterol 0.5-2.5 (3) MG/3ML Soln Commonly known as: DUONEB INHALE 3 ML BY NEBULIZATION EVERY SIX (6) HOURS AS NEEDED.   latanoprost 0.005 % ophthalmic solution Commonly known as: XALATAN Place 1 drop into both eyes at bedtime.   lidocaine-prilocaine cream Commonly known as: EMLA Apply to affected area once  loratadine 10 MG tablet Commonly known as: CLARITIN OTC Take by mouth one a day for 10 days, then once a day as needed -allergies.   LORazepam 0.5 MG tablet Commonly known as: Ativan Take 1 tablet (0.5 mg total) by mouth every 6 (six) hours as needed (Nausea or vomiting).   ondansetron 8 MG tablet Commonly known as: Zofran Take 1 tablet (8 mg total) by mouth 2 (two) times daily as needed for refractory nausea / vomiting. Start on day 2 after bendamustine chemo.   potassium chloride 10 MEQ tablet Commonly known as: KLOR-CON Take 10 mEq by mouth  daily.   prochlorperazine 10 MG tablet Commonly known as: COMPAZINE Take 1 tablet (10 mg total) by mouth every 6 (six) hours as needed (Nausea or vomiting).   Vitamin D (Ergocalciferol) 1.25 MG (50000 UNIT) Caps capsule Commonly known as: DRISDOL Take 50,000 Units by mouth once a week.       Allergies:  Allergies  Allergen Reactions  . Lisinopril Swelling    Facial swelling     . Pregabalin Other (See Comments)    constipation  constipation constipation  . Timolol Swelling and Other (See Comments)    Past Medical History, Surgical history, Social history, and Family History were reviewed and updated.  Review of Systems: All other 10 point review of systems is negative.   Physical Exam:  weight is 166 lb 1.9 oz (75.4 kg). Her temporal temperature is 97.1 F (36.2 C) (abnormal). Her blood pressure is 118/51 (abnormal) and her pulse is 97. Her respiration is 16 and oxygen saturation is 94%.   Wt Readings from Last 3 Encounters:  07/10/19 166 lb 1.9 oz (75.4 kg)  06/12/19 161 lb (73 kg)  05/15/19 148 lb (67.1 kg)    Ocular: Sclerae unicteric, pupils equal, round and reactive to light Ear-nose-throat: Oropharynx clear, dentition fair Lymphatic: No cervical or supraclavicular adenopathy Lungs no rales or rhonchi, good excursion bilaterally Heart regular rate and rhythm, no murmur appreciated Abd soft, nontender, positive bowel sounds, no liver or spleen tip palpated on exam, no fluid wave  MSK no focal spinal tenderness, no joint edema Neuro: non-focal, well-oriented, appropriate affect Breasts: Deferred   Lab Results  Component Value Date   WBC 3.2 (L) 06/12/2019   HGB 9.6 (L) 06/12/2019   HCT 32.1 (L) 06/12/2019   MCV 105.6 (H) 06/12/2019   PLT 109 (L) 06/12/2019   Lab Results  Component Value Date   FERRITIN 2,334 (H) 06/12/2019   IRON 51 06/12/2019   TIBC 231 (L) 06/12/2019   UIBC 181 06/12/2019   IRONPCTSAT 22 06/12/2019   Lab Results  Component  Value Date   RETICCTPCT 9.9 (H) 06/12/2019   RBC 3.04 (L) 06/12/2019   RBC 3.01 (L) 06/12/2019   RETICCTABS 59.2 11/13/2013   No results found for: KPAFRELGTCHN, LAMBDASER, KAPLAMBRATIO Lab Results  Component Value Date   IGGSERUM 557 (L) 06/19/2013   IGA 100 06/19/2013   IGMSERUM 95 06/19/2013   Lab Results  Component Value Date   TOTALPROTELP 6.3 09/16/2011   ALBUMINELP 59.9 09/16/2011   A1GS 7.3 (H) 09/16/2011   A2GS 9.9 09/16/2011   BETS 8.4 (H) 09/16/2011   BETA2SER 4.8 09/16/2011   GAMS 9.7 (L) 09/16/2011   MSPIKE NOT DET 09/16/2011   SPEI * 09/16/2011     Chemistry      Component Value Date/Time   NA 141 06/12/2019 0820   NA 140 09/16/2016 0945   K 3.4 (L) 06/12/2019 0820  K 3.2 (L) 09/16/2016 0945   CL 105 06/12/2019 0820   CL 101 12/08/2014 1138   CL 99 05/28/2012 1036   CO2 29 06/12/2019 0820   CO2 26 09/16/2016 0945   BUN 10 06/12/2019 0820   BUN 9.0 09/16/2016 0945   CREATININE 0.55 06/12/2019 0820   CREATININE 0.7 09/16/2016 0945      Component Value Date/Time   CALCIUM 8.7 (L) 06/12/2019 0820   CALCIUM 9.0 09/16/2016 0945   ALKPHOS 107 06/12/2019 0820   ALKPHOS 122 09/16/2016 0945   AST 32 06/12/2019 0820   AST 25 09/16/2016 0945   ALT 24 06/12/2019 0820   ALT 15 09/16/2016 0945   BILITOT 1.6 (H) 06/12/2019 0820   BILITOT 0.90 09/16/2016 0945       Impression and Plan: Ms. Nisly is a very pleasant 86 yo African American female with a history of marginal zone lymphoma, angioedema and autoimmune hemolytic anemia. We will proceed with treatment today as planned.  We did add Hep B testing today for eval while on Rituxan.  We will plan to see her again in 4 weeks.  She will contact our office with any questions or concerns. We can certainly see her sooner if needed.   Laverna Peace, NP 5/19/20218:35 AM

## 2019-07-11 ENCOUNTER — Other Ambulatory Visit: Payer: Self-pay | Admitting: Hematology & Oncology

## 2019-07-11 ENCOUNTER — Inpatient Hospital Stay: Payer: Medicare Other

## 2019-07-11 VITALS — BP 128/70 | HR 85 | Temp 97.8°F | Resp 19

## 2019-07-11 DIAGNOSIS — C8307 Small cell B-cell lymphoma, spleen: Secondary | ICD-10-CM

## 2019-07-11 DIAGNOSIS — Z5112 Encounter for antineoplastic immunotherapy: Secondary | ICD-10-CM | POA: Diagnosis not present

## 2019-07-11 MED ORDER — SODIUM CHLORIDE 0.9 % IV SOLN
10.0000 mg | Freq: Once | INTRAVENOUS | Status: AC
  Administered 2019-07-11: 10 mg via INTRAVENOUS
  Filled 2019-07-11: qty 10

## 2019-07-11 MED ORDER — SODIUM CHLORIDE 0.9% FLUSH
10.0000 mL | INTRAVENOUS | Status: DC | PRN
  Administered 2019-07-11: 10 mL
  Filled 2019-07-11: qty 10

## 2019-07-11 MED ORDER — SODIUM CHLORIDE 0.9 % IV SOLN
72.0000 mg/m2 | Freq: Once | INTRAVENOUS | Status: AC
  Administered 2019-07-11: 125 mg via INTRAVENOUS
  Filled 2019-07-11: qty 5

## 2019-07-11 MED ORDER — SODIUM CHLORIDE 0.9 % IV SOLN
Freq: Once | INTRAVENOUS | Status: AC
  Filled 2019-07-11: qty 250

## 2019-07-11 MED ORDER — HEPARIN SOD (PORK) LOCK FLUSH 100 UNIT/ML IV SOLN
500.0000 [IU] | Freq: Once | INTRAVENOUS | Status: AC | PRN
  Administered 2019-07-11: 500 [IU]
  Filled 2019-07-11: qty 5

## 2019-07-11 NOTE — Patient Instructions (Signed)
Bendamustine Injection What is this medicine? BENDAMUSTINE (BEN da MUS teen) is a chemotherapy drug. It is used to treat chronic lymphocytic leukemia and non-Hodgkin lymphoma. This medicine may be used for other purposes; ask your health care provider or pharmacist if you have questions. COMMON BRAND NAME(S): Kristine Royal, Treanda What should I tell my health care provider before I take this medicine? They need to know if you have any of these conditions:  infection (especially a virus infection such as chickenpox, cold sores, or herpes)  kidney disease  liver disease  an unusual or allergic reaction to bendamustine, mannitol, other medicines, foods, dyes, or preservatives  pregnant or trying to get pregnant  breast-feeding How should I use this medicine? This medicine is for infusion into a vein. It is given by a health care professional in a hospital or clinic setting. Talk to your pediatrician regarding the use of this medicine in children. Special care may be needed. Overdosage: If you think you have taken too much of this medicine contact a poison control center or emergency room at once. NOTE: This medicine is only for you. Do not share this medicine with others. What if I miss a dose? It is important not to miss your dose. Call your doctor or health care professional if you are unable to keep an appointment. What may interact with this medicine? Do not take this medicine with any of the following medications:  clozapine This medicine may also interact with the following medications:  atazanavir  cimetidine  ciprofloxacin  enoxacin  fluvoxamine  medicines for seizures like carbamazepine and phenobarbital  mexiletine  rifampin  tacrine  thiabendazole  zileuton This list may not describe all possible interactions. Give your health care provider a list of all the medicines, herbs, non-prescription drugs, or dietary supplements you use. Also tell them if  you smoke, drink alcohol, or use illegal drugs. Some items may interact with your medicine. What should I watch for while using this medicine? This drug may make you feel generally unwell. This is not uncommon, as chemotherapy can affect healthy cells as well as cancer cells. Report any side effects. Continue your course of treatment even though you feel ill unless your doctor tells you to stop. You may need blood work done while you are taking this medicine. Call your doctor or healthcare provider for advice if you get a fever, chills or sore throat, or other symptoms of a cold or flu. Do not treat yourself. This drug decreases your body's ability to fight infections. Try to avoid being around people who are sick. This medicine may cause serious skin reactions. They can happen weeks to months after starting the medicine. Contact your healthcare provider right away if you notice fevers or flu-like symptoms with a rash. The rash may be red or purple and then turn into blisters or peeling of the skin. Or, you might notice a red rash with swelling of the face, lips or lymph nodes in your neck or under your arms. This medicine may increase your risk to bruise or bleed. Call your doctor or healthcare provider if you notice any unusual bleeding. Talk to your doctor about your risk of cancer. You may be more at risk for certain types of cancers if you take this medicine. Do not become pregnant while taking this medicine or for at least 6 months after stopping it. Women should inform their doctor if they wish to become pregnant or think they might be pregnant. Men should not  father a child while taking this medicine and for at least 3 months after stopping it. There is a potential for serious side effects to an unborn child. Talk to your healthcare provider or pharmacist for more information. Do not breast-feed an infant while taking this medicine or for at least 1 week after stopping it. This medicine may make it  more difficult to father a child. You should talk with your doctor or healthcare provider if you are concerned about your fertility. What side effects may I notice from receiving this medicine? Side effects that you should report to your doctor or health care professional as soon as possible:  allergic reactions like skin rash, itching or hives, swelling of the face, lips, or tongue  low blood counts - this medicine may decrease the number of white blood cells, red blood cells and platelets. You may be at increased risk for infections and bleeding.  rash, fever, and swollen lymph nodes  redness, blistering, peeling, or loosening of the skin, including inside the mouth  signs of infection like fever or chills, cough, sore throat, pain or difficulty passing urine  signs of decreased platelets or bleeding like bruising, pinpoint red spots on the skin, black, tarry stools, blood in the urine  signs of decreased red blood cells like being unusually weak or tired, fainting spells, lightheadedness  signs and symptoms of kidney injury like trouble passing urine or change in the amount of urine  signs and symptoms of liver injury like dark yellow or brown urine; general ill feeling or flu-like symptoms; light-colored stools; loss of appetite; nausea; right upper belly pain; unusually weak or tired; yellowing of the eyes or skin Side effects that usually do not require medical attention (report to your doctor or health care professional if they continue or are bothersome):  constipation  decreased appetite  diarrhea  headache  mouth sores  nausea, vomiting  tiredness This list may not describe all possible side effects. Call your doctor for medical advice about side effects. You may report side effects to FDA at 1-800-FDA-1088. Where should I keep my medicine? This drug is given in a hospital or clinic and will not be stored at home. NOTE: This sheet is a summary. It may not cover all  possible information. If you have questions about this medicine, talk to your doctor, pharmacist, or health care provider.  2020 Elsevier/Gold Standard (2018-05-01 10:26:46) Rituximab injection What is this medicine? RITUXIMAB (ri TUX i mab) is a monoclonal antibody. It is used to treat certain types of cancer like non-Hodgkin lymphoma and chronic lymphocytic leukemia. It is also used to treat rheumatoid arthritis, granulomatosis with polyangiitis (or Wegener's granulomatosis), microscopic polyangiitis, and pemphigus vulgaris. This medicine may be used for other purposes; ask your health care provider or pharmacist if you have questions. COMMON BRAND NAME(S): Rituxan, RUXIENCE What should I tell my health care provider before I take this medicine? They need to know if you have any of these conditions:  heart disease  infection (especially a virus infection such as hepatitis B, chickenpox, cold sores, or herpes)  immune system problems  irregular heartbeat  kidney disease  low blood counts, like low white cell, platelet, or red cell counts  lung or breathing disease, like asthma  recently received or scheduled to receive a vaccine  an unusual or allergic reaction to rituximab, other medicines, foods, dyes, or preservatives  pregnant or trying to get pregnant  breast-feeding How should I use this medicine? This medicine is  for infusion into a vein. It is administered in a hospital or clinic by a specially trained health care professional. A special MedGuide will be given to you by the pharmacist with each prescription and refill. Be sure to read this information carefully each time. Talk to your pediatrician regarding the use of this medicine in children. This medicine is not approved for use in children. Overdosage: If you think you have taken too much of this medicine contact a poison control center or emergency room at once. NOTE: This medicine is only for you. Do not share this  medicine with others. What if I miss a dose? It is important not to miss a dose. Call your doctor or health care professional if you are unable to keep an appointment. What may interact with this medicine?  cisplatin  live virus vaccines This list may not describe all possible interactions. Give your health care provider a list of all the medicines, herbs, non-prescription drugs, or dietary supplements you use. Also tell them if you smoke, drink alcohol, or use illegal drugs. Some items may interact with your medicine. What should I watch for while using this medicine? Your condition will be monitored carefully while you are receiving this medicine. You may need blood work done while you are taking this medicine. This medicine can cause serious allergic reactions. To reduce your risk you may need to take medicine before treatment with this medicine. Take your medicine as directed. In some patients, this medicine may cause a serious brain infection that may cause death. If you have any problems seeing, thinking, speaking, walking, or standing, tell your healthcare professional right away. If you cannot reach your healthcare professional, urgently seek other source of medical care. Call your doctor or health care professional for advice if you get a fever, chills or sore throat, or other symptoms of a cold or flu. Do not treat yourself. This drug decreases your body's ability to fight infections. Try to avoid being around people who are sick. Do not become pregnant while taking this medicine or for at least 12 months after stopping it. Women should inform their doctor if they wish to become pregnant or think they might be pregnant. There is a potential for serious side effects to an unborn child. Talk to your health care professional or pharmacist for more information. Do not breast-feed an infant while taking this medicine or for at least 6 months after stopping it. What side effects may I notice from  receiving this medicine? Side effects that you should report to your doctor or health care professional as soon as possible:  allergic reactions like skin rash, itching or hives; swelling of the face, lips, or tongue  breathing problems  chest pain  changes in vision  diarrhea  headache with fever, neck stiffness, sensitivity to light, nausea, or confusion  fast, irregular heartbeat  loss of memory  low blood counts - this medicine may decrease the number of white blood cells, red blood cells and platelets. You may be at increased risk for infections and bleeding.  mouth sores  problems with balance, talking, or walking  redness, blistering, peeling or loosening of the skin, including inside the mouth  signs of infection - fever or chills, cough, sore throat, pain or difficulty passing urine  signs and symptoms of kidney injury like trouble passing urine or change in the amount of urine  signs and symptoms of liver injury like dark yellow or brown urine; general ill feeling or flu-like  symptoms; light-colored stools; loss of appetite; nausea; right upper belly pain; unusually weak or tired; yellowing of the eyes or skin  signs and symptoms of low blood pressure like dizziness; feeling faint or lightheaded, falls; unusually weak or tired  stomach pain  swelling of the ankles, feet, hands  unusual bleeding or bruising  vomiting Side effects that usually do not require medical attention (report to your doctor or health care professional if they continue or are bothersome):  headache  joint pain  muscle cramps or muscle pain  nausea  tiredness This list may not describe all possible side effects. Call your doctor for medical advice about side effects. You may report side effects to FDA at 1-800-FDA-1088. Where should I keep my medicine? This drug is given in a hospital or clinic and will not be stored at home. NOTE: This sheet is a summary. It may not cover all  possible information. If you have questions about this medicine, talk to your doctor, pharmacist, or health care provider.  2020 Elsevier/Gold Standard (2018-03-21 22:01:36)  

## 2019-07-12 LAB — HEPATITIS B DNA, ULTRAQUANTITATIVE, PCR
HBV DNA SERPL PCR-ACNC: NOT DETECTED IU/mL
HBV DNA SERPL PCR-LOG IU: UNDETERMINED log10 IU/mL

## 2019-08-01 NOTE — Progress Notes (Unsigned)
Pharmacist Chemotherapy Monitoring - Follow Up Assessment    I verify that I have reviewed each item in the below checklist:  . Regimen for the patient is scheduled for the appropriate day and plan matches scheduled date. [name] Appropriate non-routine labs are ordered dependent on drug ordered. . If applicable, additional medications reviewed and ordered per protocol based on lifetime cumulative doses and/or treatment regimen.   Plan for follow-up and/or issues identified: No . I-vent associated with next due treatment: No . MD and/or nursing notified: No  Bridey Brookover, Jacqlyn Larsen 08/01/2019 8:06 AM

## 2019-08-08 ENCOUNTER — Other Ambulatory Visit: Payer: Self-pay

## 2019-08-08 ENCOUNTER — Inpatient Hospital Stay: Payer: Medicare Other | Attending: Hematology & Oncology

## 2019-08-08 ENCOUNTER — Encounter (INDEPENDENT_AMBULATORY_CARE_PROVIDER_SITE_OTHER): Payer: Self-pay

## 2019-08-08 ENCOUNTER — Inpatient Hospital Stay: Payer: Medicare Other

## 2019-08-08 ENCOUNTER — Encounter: Payer: Self-pay | Admitting: Hematology & Oncology

## 2019-08-08 ENCOUNTER — Inpatient Hospital Stay (HOSPITAL_BASED_OUTPATIENT_CLINIC_OR_DEPARTMENT_OTHER): Payer: Medicare Other | Admitting: Hematology & Oncology

## 2019-08-08 ENCOUNTER — Telehealth: Payer: Self-pay | Admitting: Hematology & Oncology

## 2019-08-08 VITALS — BP 159/70 | HR 88 | Temp 98.0°F | Resp 18 | Wt 160.0 lb

## 2019-08-08 DIAGNOSIS — D500 Iron deficiency anemia secondary to blood loss (chronic): Secondary | ICD-10-CM

## 2019-08-08 DIAGNOSIS — D5911 Warm autoimmune hemolytic anemia: Secondary | ICD-10-CM | POA: Diagnosis not present

## 2019-08-08 DIAGNOSIS — C8300 Small cell B-cell lymphoma, unspecified site: Secondary | ICD-10-CM | POA: Diagnosis not present

## 2019-08-08 DIAGNOSIS — B1910 Unspecified viral hepatitis B without hepatic coma: Secondary | ICD-10-CM | POA: Insufficient documentation

## 2019-08-08 DIAGNOSIS — C8307 Small cell B-cell lymphoma, spleen: Secondary | ICD-10-CM

## 2019-08-08 DIAGNOSIS — M7989 Other specified soft tissue disorders: Secondary | ICD-10-CM | POA: Insufficient documentation

## 2019-08-08 DIAGNOSIS — D509 Iron deficiency anemia, unspecified: Secondary | ICD-10-CM | POA: Insufficient documentation

## 2019-08-08 LAB — CMP (CANCER CENTER ONLY)
ALT: 33 U/L (ref 0–44)
AST: 30 U/L (ref 15–41)
Albumin: 3.6 g/dL (ref 3.5–5.0)
Alkaline Phosphatase: 119 U/L (ref 38–126)
Anion gap: 9 (ref 5–15)
BUN: 8 mg/dL (ref 8–23)
CO2: 28 mmol/L (ref 22–32)
Calcium: 8.7 mg/dL — ABNORMAL LOW (ref 8.9–10.3)
Chloride: 103 mmol/L (ref 98–111)
Creatinine: 0.65 mg/dL (ref 0.44–1.00)
GFR, Est AFR Am: >60 mL/min (ref >60)
GFR, Estimated: >60 mL/min (ref >60)
Glucose, Bld: 123 mg/dL — ABNORMAL HIGH (ref 70–99)
Potassium: 3.4 mmol/L — ABNORMAL LOW (ref 3.5–5.1)
Sodium: 140 mmol/L (ref 135–145)
Total Bilirubin: 1.3 mg/dL — ABNORMAL HIGH (ref 0.3–1.2)
Total Protein: 5.4 g/dL — ABNORMAL LOW (ref 6.5–8.1)

## 2019-08-08 LAB — CBC WITH DIFFERENTIAL (CANCER CENTER ONLY)
Abs Immature Granulocytes: 0.02 10*3/uL (ref 0.00–0.07)
Basophils Absolute: 0 10*3/uL (ref 0.0–0.1)
Basophils Relative: 1 %
Eosinophils Absolute: 0 10*3/uL (ref 0.0–0.5)
Eosinophils Relative: 1 %
HCT: 37.5 % (ref 36.0–46.0)
Hemoglobin: 11.4 g/dL — ABNORMAL LOW (ref 12.0–15.0)
Immature Granulocytes: 1 %
Lymphocytes Relative: 21 %
Lymphs Abs: 0.3 10*3/uL — ABNORMAL LOW (ref 0.7–4.0)
MCH: 29.6 pg (ref 26.0–34.0)
MCHC: 30.4 g/dL (ref 30.0–36.0)
MCV: 97.4 fL (ref 80.0–100.0)
Monocytes Absolute: 0.4 10*3/uL (ref 0.1–1.0)
Monocytes Relative: 22 %
Neutro Abs: 0.9 10*3/uL — ABNORMAL LOW (ref 1.7–7.7)
Neutrophils Relative %: 54 %
Platelet Count: 100 10*3/uL — ABNORMAL LOW (ref 150–400)
RBC: 3.85 MIL/uL — ABNORMAL LOW (ref 3.87–5.11)
RDW: 14.8 % (ref 11.5–15.5)
WBC Count: 1.7 10*3/uL — ABNORMAL LOW (ref 4.0–10.5)
nRBC: 0 % (ref 0.0–0.2)

## 2019-08-08 LAB — IRON AND TIBC
Iron: 51 ug/dL (ref 41–142)
Saturation Ratios: 20 % — ABNORMAL LOW (ref 21–57)
TIBC: 250 ug/dL (ref 236–444)
UIBC: 199 ug/dL (ref 120–384)

## 2019-08-08 LAB — LACTATE DEHYDROGENASE: LDH: 413 U/L — ABNORMAL HIGH (ref 98–192)

## 2019-08-08 LAB — SAMPLE TO BLOOD BANK

## 2019-08-08 LAB — RETICULOCYTES
Immature Retic Fract: 26 % — ABNORMAL HIGH (ref 2.3–15.9)
RBC.: 3.88 MIL/uL (ref 3.87–5.11)
Retic Count, Absolute: 208.4 10*3/uL — ABNORMAL HIGH (ref 19.0–186.0)
Retic Ct Pct: 5.4 % — ABNORMAL HIGH (ref 0.4–3.1)

## 2019-08-08 LAB — SAVE SMEAR(SSMR), FOR PROVIDER SLIDE REVIEW

## 2019-08-08 LAB — FERRITIN: Ferritin: 2848 ng/mL — ABNORMAL HIGH (ref 11–307)

## 2019-08-08 MED ORDER — DILTIAZEM HCL ER 90 MG PO CP12
90.0000 mg | ORAL_CAPSULE | Freq: Two times a day (BID) | ORAL | 4 refills | Status: DC
Start: 2019-08-08 — End: 2020-01-08

## 2019-08-08 NOTE — Telephone Encounter (Signed)
Appointments scheduled calendar printed per 6/17 los 

## 2019-08-08 NOTE — Progress Notes (Signed)
Hematology and Oncology Follow Up Visit  Aiesha Leland [id number] Mar 23, 1931 86 y.o. 08/08/2019   Principle Diagnosis:  Marginal zone lymphoma -- recurrent Iron deficiency anemia Autoimmune hemolytic anemia -- warm antibody  Past therapy: CVP -- completed 8 cycles on 04/09/2012 Maintenance Rituxan - 2 years completed in June 2016 Rituxan/Cytoxan -- startedon 11/08/2018, s/p cycle 2, Cytoxan d/c'd 12/19/2018  Current Therapy:        Rituxan/Bendamustine - started on 04/17/2019, s/p cycle #4 IV Iron as needed  Interim History:  Ms. Rallo is here today for follow-up and treatment.  Her main complaint right now is that she has swelling in her lower legs.  I do not know if this might be from the amlodipine.  I really think that we can probably get her off the amlodipine and try her on something else.  I probably would try her on some diltiazem.  This may not be a bad idea.  Hopefully this will help some of the leg swelling.  I do not think that the treatment protocol is doing this.  The treatment protocol is working incredibly well.  Her hemoglobin today is 11.4.  This truly is amazing.  Again, I have to believe that the protocol with bendamustine is helping.  She has had no problems with nausea or vomiting.  There is no cough.  She has had no fever.  She has had no rashes.  There has been no headache.  I really do not think we will be able to treat her today.  Her white cell count is only 1.7.  Since she is doing so well, I think we can give her a 2-week break and have her come back in her white cell count should be a little bit better.    Medications:  Allergies as of 08/08/2019      Reactions   Lisinopril Swelling   Facial swelling    Pregabalin Other (See Comments)   constipation constipation constipation   Timolol Swelling, Other (See Comments)      Medication List       Accurate as of August 08, 2019  9:02 AM. If you have any questions, ask your nurse or doctor.          STOP taking these medications   folic acid 1 MG tablet Commonly known as: FOLVITE Stopped by: [name], MD     TAKE these medications   acyclovir 400 MG tablet Commonly known as: ZOVIRAX TAKE 1 TABLET BY MOUTH EVERY DAY   allopurinol 300 MG tablet Commonly known as: ZYLOPRIM TAKE 1 TABLET BY MOUTH EVERY DAY   amLODipine 5 MG tablet Commonly known as: NORVASC Take 5 mg by mouth daily.   apixaban 5 MG Tabs tablet Commonly known as: ELIQUIS Take 5 mg by mouth 2 (two) times daily.   aspirin 81 MG tablet Take 81 mg by mouth daily.   atorvastatin 80 MG tablet Commonly known as: LIPITOR Take 40 mg by mouth daily.   Calcium 600-D 600-400 MG-UNIT Tabs Generic drug: Calcium Carbonate-Vitamin D3 Take by mouth.   dexamethasone 4 MG tablet Commonly known as: DECADRON Take 2 tablets (8 mg total) by mouth daily. Start the day after bendamustine chemotherapy for 2 days. Take with food.   diphenhydrAMINE 25 MG tablet Commonly known as: SOMINEX Take 25 mg by mouth daily.   dorzolamide 2 % ophthalmic solution Commonly known as: TRUSOPT Place 1 drop into both eyes 3 (three) times daily.   furosemide 40 MG tablet Commonly  known as: LASIX Take 40 mg by mouth daily.   ipratropium-albuterol 0.5-2.5 (3) MG/3ML Soln Commonly known as: DUONEB INHALE 3 ML BY NEBULIZATION EVERY SIX (6) HOURS AS NEEDED.   latanoprost 0.005 % ophthalmic solution Commonly known as: XALATAN Place 1 drop into both eyes at bedtime.   lidocaine-prilocaine cream Commonly known as: EMLA Apply to affected area once   loratadine 10 MG tablet Commonly known as: CLARITIN OTC Take by mouth one a day for 10 days, then once a day as needed -allergies.   LORazepam 0.5 MG tablet Commonly known as: Ativan Take 1 tablet (0.5 mg total) by mouth every 6 (six) hours as needed (Nausea or vomiting).   ondansetron 8 MG tablet Commonly known as: Zofran Take 1 tablet (8 mg total) by mouth 2 (two)  times daily as needed for refractory nausea / vomiting. Start on day 2 after bendamustine chemo.   potassium chloride 10 MEQ tablet Commonly known as: KLOR-CON Take 10 mEq by mouth daily.   prochlorperazine 10 MG tablet Commonly known as: COMPAZINE Take 1 tablet (10 mg total) by mouth every 6 (six) hours as needed (Nausea or vomiting).   Vitamin D (Ergocalciferol) 1.25 MG (50000 UNIT) Caps capsule Commonly known as: DRISDOL Take 50,000 Units by mouth once a week.       Allergies:  Allergies  Allergen Reactions  . Lisinopril Swelling    Facial swelling     . Pregabalin Other (See Comments)    constipation  constipation constipation  . Timolol Swelling and Other (See Comments)    Past Medical History, Surgical history, Social history, and Family History were reviewed and updated.  Review of Systems: Review of Systems  Constitutional: Negative.   HENT: Negative.   Eyes: Negative.   Respiratory: Negative.   Cardiovascular: Positive for leg swelling.  Gastrointestinal: Negative.   Genitourinary: Negative.   Musculoskeletal: Negative.   Skin: Negative.   Neurological: Negative.   Endo/Heme/Allergies: Negative.   Psychiatric/Behavioral: Negative.      Physical Exam:  weight is 160 lb (72.6 kg). Her temporal temperature is 98 F (36.7 C). Her blood pressure is 159/70 (abnormal) and her pulse is 88. Her respiration is 18 and oxygen saturation is 91%.   Wt Readings from Last 3 Encounters:  08/08/19 160 lb (72.6 kg)  07/10/19 166 lb 1.9 oz (75.4 kg)  06/12/19 161 lb (73 kg)    Physical Exam Vitals reviewed.  HENT:     Head: Normocephalic and atraumatic.  Eyes:     Pupils: Pupils are equal, round, and reactive to light.  Cardiovascular:     Rate and Rhythm: Normal rate and regular rhythm.     Heart sounds: Normal heart sounds.  Pulmonary:     Effort: Pulmonary effort is normal.     Breath sounds: Normal breath sounds.  Abdominal:     General: Bowel sounds  are normal.     Palpations: Abdomen is soft.  Musculoskeletal:        General: No tenderness or deformity. Normal range of motion.     Cervical back: Normal range of motion.     Comments: Her extremities shows some edema which is nonpitting in the lower legs.  This is bilateral.  There is no venous cord.  She has no erythema or warmth.  She has good strength and good range of motion of her joints.  Lymphadenopathy:     Cervical: No cervical adenopathy.  Skin:    General: Skin is warm and dry.  Findings: No erythema or rash.  Neurological:     Mental Status: She is alert and oriented to person, place, and time.  Psychiatric:        Behavior: Behavior normal.        Thought Content: Thought content normal.        Judgment: Judgment normal.      Lab Results  Component Value Date   WBC 1.7 (L) 08/08/2019   HGB 11.4 (L) 08/08/2019   HCT 37.5 08/08/2019   MCV 97.4 08/08/2019   PLT 100 (L) 08/08/2019   Lab Results  Component Value Date   FERRITIN 2,334 (H) 06/12/2019   IRON 51 06/12/2019   TIBC 231 (L) 06/12/2019   UIBC 181 06/12/2019   IRONPCTSAT 22 06/12/2019   Lab Results  Component Value Date   RETICCTPCT 5.4 (H) 08/08/2019   RBC 3.85 (L) 08/08/2019   RBC 3.88 08/08/2019   RETICCTABS 59.2 11/13/2013   No results found for: Nils Pyle, [facility] Lab Results  Component Value Date   IGGSERUM 557 (L) 06/19/2013   IGA 100 06/19/2013   IGMSERUM 95 06/19/2013   Lab Results  Component Value Date   TOTALPROTELP 6.3 09/16/2011   ALBUMINELP 59.9 09/16/2011   A1GS 7.3 (H) 09/16/2011   A2GS 9.9 09/16/2011   BETS 8.4 (H) 09/16/2011   BETA2SER 4.8 09/16/2011   GAMS 9.7 (L) 09/16/2011   MSPIKE NOT DET 09/16/2011   SPEI * 09/16/2011     Chemistry      Component Value Date/Time   NA 140 08/08/2019 0824   NA 140 09/16/2016 0945   K 3.4 (L) 08/08/2019 0824   K 3.2 (L) 09/16/2016 0945   CL 103 08/08/2019 0824   CL 101 12/08/2014 1138   CL 99  05/28/2012 1036   CO2 28 08/08/2019 0824   CO2 26 09/16/2016 0945   BUN 8 08/08/2019 0824   BUN 9.0 09/16/2016 0945   CREATININE 0.65 08/08/2019 0824   CREATININE 0.7 09/16/2016 0945      Component Value Date/Time   CALCIUM 8.7 (L) 08/08/2019 0824   CALCIUM 9.0 09/16/2016 0945   ALKPHOS 119 08/08/2019 0824   ALKPHOS 122 09/16/2016 0945   AST 30 08/08/2019 0824   AST 25 09/16/2016 0945   ALT 33 08/08/2019 0824   ALT 15 09/16/2016 0945   BILITOT 1.3 (H) 08/08/2019 0824   BILITOT 0.90 09/16/2016 0945       Impression and Plan: Ms. Barsch is a very pleasant 86 yo African American female with a history of marginal zone lymphoma, angioedema and autoimmune hemolytic anemia.  Again, I have to believe that she is responding to treatment.  We will hold her treatment for 2 weeks.  Maybe the swelling in her legs will improve.  I forgot to mention that she is Hepatitis B.  We will have her come back in 2 weeks and see how she is doing.   [name], MD 6/17/20219:02 AM

## 2019-08-09 ENCOUNTER — Inpatient Hospital Stay: Payer: Medicare Other

## 2019-08-22 ENCOUNTER — Inpatient Hospital Stay (HOSPITAL_BASED_OUTPATIENT_CLINIC_OR_DEPARTMENT_OTHER): Payer: Medicare Other | Admitting: Family

## 2019-08-22 ENCOUNTER — Other Ambulatory Visit: Payer: Self-pay

## 2019-08-22 ENCOUNTER — Inpatient Hospital Stay: Payer: Medicare Other | Attending: Hematology & Oncology

## 2019-08-22 ENCOUNTER — Encounter: Payer: Self-pay | Admitting: Family

## 2019-08-22 ENCOUNTER — Telehealth: Payer: Self-pay | Admitting: Family

## 2019-08-22 ENCOUNTER — Inpatient Hospital Stay: Payer: Medicare Other

## 2019-08-22 VITALS — BP 153/72 | HR 89 | Temp 97.5°F

## 2019-08-22 VITALS — BP 153/72 | HR 89 | Temp 97.5°F | Resp 18 | Ht 62.0 in | Wt 161.0 lb

## 2019-08-22 DIAGNOSIS — B1910 Unspecified viral hepatitis B without hepatic coma: Secondary | ICD-10-CM | POA: Insufficient documentation

## 2019-08-22 DIAGNOSIS — Z5112 Encounter for antineoplastic immunotherapy: Secondary | ICD-10-CM | POA: Insufficient documentation

## 2019-08-22 DIAGNOSIS — G629 Polyneuropathy, unspecified: Secondary | ICD-10-CM | POA: Insufficient documentation

## 2019-08-22 DIAGNOSIS — D509 Iron deficiency anemia, unspecified: Secondary | ICD-10-CM | POA: Diagnosis not present

## 2019-08-22 DIAGNOSIS — C8307 Small cell B-cell lymphoma, spleen: Secondary | ICD-10-CM | POA: Diagnosis not present

## 2019-08-22 DIAGNOSIS — D5911 Warm autoimmune hemolytic anemia: Secondary | ICD-10-CM | POA: Diagnosis not present

## 2019-08-22 DIAGNOSIS — C8300 Small cell B-cell lymphoma, unspecified site: Secondary | ICD-10-CM | POA: Diagnosis present

## 2019-08-22 DIAGNOSIS — D500 Iron deficiency anemia secondary to blood loss (chronic): Secondary | ICD-10-CM

## 2019-08-22 DIAGNOSIS — Z95828 Presence of other vascular implants and grafts: Secondary | ICD-10-CM

## 2019-08-22 LAB — CMP (CANCER CENTER ONLY)
ALT: 22 U/L (ref 0–44)
AST: 24 U/L (ref 15–41)
Albumin: 3.7 g/dL (ref 3.5–5.0)
Alkaline Phosphatase: 104 U/L (ref 38–126)
Anion gap: 7 (ref 5–15)
BUN: 10 mg/dL (ref 8–23)
CO2: 28 mmol/L (ref 22–32)
Calcium: 8.7 mg/dL — ABNORMAL LOW (ref 8.9–10.3)
Chloride: 106 mmol/L (ref 98–111)
Creatinine: 0.73 mg/dL (ref 0.44–1.00)
GFR, Est AFR Am: >60 mL/min (ref >60)
GFR, Estimated: >60 mL/min (ref >60)
Glucose, Bld: 118 mg/dL — ABNORMAL HIGH (ref 70–99)
Potassium: 3.9 mmol/L (ref 3.5–5.1)
Sodium: 141 mmol/L (ref 135–145)
Total Bilirubin: 1 mg/dL (ref 0.3–1.2)
Total Protein: 5.6 g/dL — ABNORMAL LOW (ref 6.5–8.1)

## 2019-08-22 LAB — CBC WITH DIFFERENTIAL (CANCER CENTER ONLY)
Abs Immature Granulocytes: 0.04 10*3/uL (ref 0.00–0.07)
Basophils Absolute: 0 10*3/uL (ref 0.0–0.1)
Basophils Relative: 1 %
Eosinophils Absolute: 0 10*3/uL (ref 0.0–0.5)
Eosinophils Relative: 2 %
HCT: 39.8 % (ref 36.0–46.0)
Hemoglobin: 12.2 g/dL (ref 12.0–15.0)
Immature Granulocytes: 2 %
Lymphocytes Relative: 20 %
Lymphs Abs: 0.3 10*3/uL — ABNORMAL LOW (ref 0.7–4.0)
MCH: 29.3 pg (ref 26.0–34.0)
MCHC: 30.7 g/dL (ref 30.0–36.0)
MCV: 95.4 fL (ref 80.0–100.0)
Monocytes Absolute: 0.5 10*3/uL (ref 0.1–1.0)
Monocytes Relative: 27 %
Neutro Abs: 0.8 10*3/uL — ABNORMAL LOW (ref 1.7–7.7)
Neutrophils Relative %: 48 %
Platelet Count: 106 10*3/uL — ABNORMAL LOW (ref 150–400)
RBC: 4.17 MIL/uL (ref 3.87–5.11)
RDW: 14.3 % (ref 11.5–15.5)
WBC Count: 1.7 10*3/uL — ABNORMAL LOW (ref 4.0–10.5)
nRBC: 0 % (ref 0.0–0.2)

## 2019-08-22 LAB — FERRITIN: Ferritin: 2507 ng/mL — ABNORMAL HIGH (ref 11–307)

## 2019-08-22 LAB — RETICULOCYTES
Immature Retic Fract: 20.3 % — ABNORMAL HIGH (ref 2.3–15.9)
RBC.: 4.15 MIL/uL (ref 3.87–5.11)
Retic Count, Absolute: 143.2 10*3/uL (ref 19.0–186.0)
Retic Ct Pct: 3.5 % — ABNORMAL HIGH (ref 0.4–3.1)

## 2019-08-22 LAB — LACTATE DEHYDROGENASE: LDH: 323 U/L — ABNORMAL HIGH (ref 98–192)

## 2019-08-22 LAB — IRON AND TIBC
Iron: 64 ug/dL (ref 41–142)
Saturation Ratios: 25 % (ref 21–57)
TIBC: 254 ug/dL (ref 236–444)
UIBC: 190 ug/dL (ref 120–384)

## 2019-08-22 MED ORDER — HEPARIN SOD (PORK) LOCK FLUSH 100 UNIT/ML IV SOLN
500.0000 [IU] | Freq: Once | INTRAVENOUS | Status: AC
  Administered 2019-08-22: 500 [IU] via INTRAVENOUS
  Filled 2019-08-22: qty 5

## 2019-08-22 MED ORDER — SODIUM CHLORIDE 0.9% FLUSH
10.0000 mL | Freq: Once | INTRAVENOUS | Status: AC
  Administered 2019-08-22: 10 mL via INTRAVENOUS
  Filled 2019-08-22: qty 10

## 2019-08-22 NOTE — Progress Notes (Signed)
Hematology and Oncology Follow Up Visit  Jupiter Boys [id number] [birth year] 86 y.o. 08/22/2019   Principle Diagnosis:  Marginal zone lymphoma -- recurrent Iron deficiency anemia Autoimmune hemolytic anemia -- warm antibody  Past therapy: CVP -- completed 8 cycles on 04/09/2012 Maintenance Rituxan - 2 years completed in June 2016 Rituxan/Cytoxan -- startedon 11/08/2018, s/p cycle 2, Cytoxan d/c'd 12/19/2018  Current Therapy: Rituxan/Bendamustine - startedon 04/17/2019, s/p cycle 4 IV Iron as needed   Interim History:  Ms. Shearer is here today for follow-up. She is doing quite well and has no complaints at this time.  Since stopping the Amlodipine and starting Cardizem and Lasix the swelling in her legs has resolved. She has also noted that her SOB with exertion has improved.  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 No falls or syncopal episodes to report.  No fever, chills, n/v, cough, rash, dizziness, chest pain, palpitations, abdominal pain or changes in bowel or bladder habits.  Hgb is up to 12.2, WBC count 1.7 and platelets 106.  No episodes of bleeding. No bruising or petechiae.  Her appetite comes and goes but she is still making herself eat and having a Boost daily. She is staying properly hydrated. Weight is stable at 161 lbs.   ECOG Performance Status: 1 - Symptomatic but completely ambulatory  Medications:  Allergies as of 08/22/2019      Reactions   Lisinopril Swelling   Facial swelling    Pregabalin Other (See Comments)   constipation   Timolol Swelling, Other (See Comments)      Medication List       Accurate as of August 22, 2019  9:19 AM. If you have any questions, ask your nurse or doctor.        acyclovir 400 MG tablet Commonly known as: ZOVIRAX TAKE 1 TABLET BY MOUTH EVERY DAY   allopurinol 300 MG tablet Commonly known as: ZYLOPRIM TAKE 1 TABLET BY MOUTH EVERY DAY   apixaban 5 MG Tabs tablet Commonly known as:  ELIQUIS Take 5 mg by mouth 2 (two) times daily.   aspirin 81 MG tablet Take 81 mg by mouth daily.   atorvastatin 80 MG tablet Commonly known as: LIPITOR Take 40 mg by mouth daily.   Calcium 600-D 600-400 MG-UNIT Tabs Generic drug: Calcium Carbonate-Vitamin D3 Take by mouth.   dexamethasone 4 MG tablet Commonly known as: DECADRON Take 2 tablets (8 mg total) by mouth daily. Start the day after bendamustine chemotherapy for 2 days. Take with food.   diltiazem 90 MG 12 hr capsule Commonly known as: CARDIZEM SR Take 1 capsule (90 mg total) by mouth 2 (two) times daily.   diphenhydrAMINE 25 MG tablet Commonly known as: SOMINEX Take 25 mg by mouth daily.   dorzolamide 2 % ophthalmic solution Commonly known as: TRUSOPT Place 1 drop into both eyes 3 (three) times daily.   furosemide 40 MG tablet Commonly known as: LASIX Take 40 mg by mouth daily.   ipratropium-albuterol 0.5-2.5 (3) MG/3ML Soln Commonly known as: DUONEB INHALE 3 ML BY NEBULIZATION EVERY SIX (6) HOURS AS NEEDED.   latanoprost 0.005 % ophthalmic solution Commonly known as: XALATAN Place 1 drop into both eyes at bedtime.   lidocaine-prilocaine cream Commonly known as: EMLA Apply to affected area once   loratadine 10 MG tablet Commonly known as: CLARITIN OTC Take by mouth one a day for 10 days, then once a day as needed -allergies.   LORazepam 0.5 MG tablet Commonly known as: Ativan Take  1 tablet (0.5 mg total) by mouth every 6 (six) hours as needed (Nausea or vomiting).   ondansetron 8 MG tablet Commonly known as: Zofran Take 1 tablet (8 mg total) by mouth 2 (two) times daily as needed for refractory nausea / vomiting. Start on day 2 after bendamustine chemo.   potassium chloride 10 MEQ tablet Commonly known as: KLOR-CON Take 10 mEq by mouth daily.   prochlorperazine 10 MG tablet Commonly known as: COMPAZINE Take 1 tablet (10 mg total) by mouth every 6 (six) hours as needed (Nausea or vomiting).    spironolactone 25 MG tablet Commonly known as: ALDACTONE Take by mouth.   Vitamin D (Ergocalciferol) 1.25 MG (50000 UNIT) Caps capsule Commonly known as: DRISDOL Take 50,000 Units by mouth once a week.       Allergies:  Allergies  Allergen Reactions  . Lisinopril Swelling    Facial swelling     . Pregabalin Other (See Comments)    constipation  . Timolol Swelling and Other (See Comments)    Past Medical History, Surgical history, Social history, and Family History were reviewed and updated.  Review of Systems: All other 10 point review of systems is negative.   Physical Exam:  height is 5\' 2"  (1.575 m) and weight is 161 lb (73 kg). Her temporal temperature is 97.5 F (36.4 C) (abnormal). Her blood pressure is 153/72 (abnormal) and her pulse is 89. Her respiration is 18 and oxygen saturation is 96%.   Wt Readings from Last 3 Encounters:  08/22/19 161 lb (73 kg)  08/08/19 160 lb (72.6 kg)  07/10/19 166 lb 1.9 oz (75.4 kg)    Ocular: Sclerae unicteric, pupils equal, round and reactive to light Ear-nose-throat: Oropharynx clear, dentition fair Lymphatic: No cervical or supraclavicular adenopathy Lungs no rales or rhonchi, good excursion bilaterally Heart regular rate and rhythm, no murmur appreciated Abd soft, nontender, positive bowel sounds, no liver or spleen tip palpated on exam, no fluid wave  MSK no focal spinal tenderness, no joint edema Neuro: non-focal, well-oriented, appropriate affect Breasts: Deferred   Lab Results  Component Value Date   WBC 1.7 (L) 08/22/2019   HGB 12.2 08/22/2019   HCT 39.8 08/22/2019   MCV 95.4 08/22/2019   PLT 106 (L) 08/22/2019   Lab Results  Component Value Date   FERRITIN 2,848 (H) 08/08/2019   IRON 51 08/08/2019   TIBC 250 08/08/2019   UIBC 199 08/08/2019   IRONPCTSAT 20 (L) 08/08/2019   Lab Results  Component Value Date   RETICCTPCT 3.5 (H) 08/22/2019   RBC 4.15 08/22/2019   RBC 4.17 08/22/2019   RETICCTABS 59.2  11/13/2013   No results found for: KPAFRELGTCHN, LAMBDASER, KAPLAMBRATIO Lab Results  Component Value Date   IGGSERUM 557 (L) 06/19/2013   IGA 100 06/19/2013   IGMSERUM 95 06/19/2013   Lab Results  Component Value Date   TOTALPROTELP 6.3 09/16/2011   ALBUMINELP 59.9 09/16/2011   A1GS 7.3 (H) 09/16/2011   A2GS 9.9 09/16/2011   BETS 8.4 (H) 09/16/2011   BETA2SER 4.8 09/16/2011   GAMS 9.7 (L) 09/16/2011   MSPIKE NOT DET 09/16/2011   SPEI * 09/16/2011     Chemistry      Component Value Date/Time   NA 141 08/22/2019 0815   NA 140 09/16/2016 0945   K 3.9 08/22/2019 0815   K 3.2 (L) 09/16/2016 0945   CL 106 08/22/2019 0815   CL 101 12/08/2014 1138   CL 99 05/28/2012 1036   CO2  28 08/22/2019 0815   CO2 26 09/16/2016 0945   BUN 10 08/22/2019 0815   BUN 9.0 09/16/2016 0945   CREATININE 0.73 08/22/2019 0815   CREATININE 0.7 09/16/2016 0945      Component Value Date/Time   CALCIUM 8.7 (L) 08/22/2019 0815   CALCIUM 9.0 09/16/2016 0945   ALKPHOS 104 08/22/2019 0815   ALKPHOS 122 09/16/2016 0945   AST 24 08/22/2019 0815   AST 25 09/16/2016 0945   ALT 22 08/22/2019 0815   ALT 15 09/16/2016 0945   BILITOT 1.0 08/22/2019 0815   BILITOT 0.90 09/16/2016 0945       Impression and Plan: Ms. Hollenkamp is a very pleasant 86 yo African American female with a history of marginal zone lymphoma, angioedema and autoimmune hemolytic anemia. She continues to do well and has no complaints at this time.  We will hold treatment today per Dr. Marin Olp as her Hgb is much improved but ANC is still low.  We will plan to see her again in another 3 weeks.  She will contact our office with any questions or concerns. We can certainly see her sooner if needed.   Laverna Peace, NP 7/1/20219:19 AM

## 2019-08-22 NOTE — Addendum Note (Signed)
Addended by: Tyler Aas A on: 08/22/2019 09:15 AM   Modules accepted: Orders

## 2019-08-22 NOTE — Patient Instructions (Signed)

## 2019-08-22 NOTE — Telephone Encounter (Signed)
lmom to inform patient tf next appts per 7/1 LOS 7/22 at [facility]

## 2019-08-23 ENCOUNTER — Inpatient Hospital Stay: Payer: Medicare Other

## 2019-09-04 ENCOUNTER — Other Ambulatory Visit: Payer: Medicare Other

## 2019-09-04 ENCOUNTER — Ambulatory Visit: Payer: Medicare Other | Admitting: Family

## 2019-09-04 ENCOUNTER — Ambulatory Visit: Payer: Medicare Other

## 2019-09-05 ENCOUNTER — Ambulatory Visit: Payer: Medicare Other

## 2019-09-12 ENCOUNTER — Inpatient Hospital Stay: Payer: Medicare Other

## 2019-09-12 ENCOUNTER — Encounter: Payer: Self-pay | Admitting: Hematology & Oncology

## 2019-09-12 ENCOUNTER — Inpatient Hospital Stay (HOSPITAL_BASED_OUTPATIENT_CLINIC_OR_DEPARTMENT_OTHER): Payer: Medicare Other | Admitting: Hematology & Oncology

## 2019-09-12 ENCOUNTER — Other Ambulatory Visit: Payer: Self-pay

## 2019-09-12 ENCOUNTER — Telehealth: Payer: Self-pay | Admitting: Hematology & Oncology

## 2019-09-12 VITALS — BP 108/56 | HR 70 | Temp 98.6°F | Resp 18

## 2019-09-12 VITALS — BP 127/74 | HR 88 | Temp 97.7°F | Resp 18 | Wt 144.0 lb

## 2019-09-12 DIAGNOSIS — C8307 Small cell B-cell lymphoma, spleen: Secondary | ICD-10-CM | POA: Diagnosis not present

## 2019-09-12 DIAGNOSIS — D5911 Warm autoimmune hemolytic anemia: Secondary | ICD-10-CM

## 2019-09-12 DIAGNOSIS — Z5112 Encounter for antineoplastic immunotherapy: Secondary | ICD-10-CM | POA: Diagnosis not present

## 2019-09-12 LAB — CBC WITH DIFFERENTIAL (CANCER CENTER ONLY)
Abs Immature Granulocytes: 0.02 10*3/uL (ref 0.00–0.07)
Basophils Absolute: 0 10*3/uL (ref 0.0–0.1)
Basophils Relative: 1 %
Eosinophils Absolute: 0.1 10*3/uL (ref 0.0–0.5)
Eosinophils Relative: 2 %
HCT: 41.2 % (ref 36.0–46.0)
Hemoglobin: 13.3 g/dL (ref 12.0–15.0)
Immature Granulocytes: 1 %
Lymphocytes Relative: 10 %
Lymphs Abs: 0.3 10*3/uL — ABNORMAL LOW (ref 0.7–4.0)
MCH: 29.4 pg (ref 26.0–34.0)
MCHC: 32.3 g/dL (ref 30.0–36.0)
MCV: 91.2 fL (ref 80.0–100.0)
Monocytes Absolute: 0.5 10*3/uL (ref 0.1–1.0)
Monocytes Relative: 20 %
Neutro Abs: 1.7 10*3/uL (ref 1.7–7.7)
Neutrophils Relative %: 66 %
Platelet Count: 96 10*3/uL — ABNORMAL LOW (ref 150–400)
RBC: 4.52 MIL/uL (ref 3.87–5.11)
RDW: 13.6 % (ref 11.5–15.5)
WBC Count: 2.6 10*3/uL — ABNORMAL LOW (ref 4.0–10.5)
nRBC: 0 % (ref 0.0–0.2)

## 2019-09-12 LAB — CMP (CANCER CENTER ONLY)
ALT: 24 U/L (ref 0–44)
AST: 23 U/L (ref 15–41)
Albumin: 4.2 g/dL (ref 3.5–5.0)
Alkaline Phosphatase: 123 U/L (ref 38–126)
Anion gap: 6 (ref 5–15)
BUN: 13 mg/dL (ref 8–23)
CO2: 27 mmol/L (ref 22–32)
Calcium: 9.1 mg/dL (ref 8.9–10.3)
Chloride: 107 mmol/L (ref 98–111)
Creatinine: 0.66 mg/dL (ref 0.44–1.00)
GFR, Est AFR Am: >60 mL/min (ref >60)
GFR, Estimated: >60 mL/min (ref >60)
Glucose, Bld: 128 mg/dL — ABNORMAL HIGH (ref 70–99)
Potassium: 4.1 mmol/L (ref 3.5–5.1)
Sodium: 140 mmol/L (ref 135–145)
Total Bilirubin: 0.8 mg/dL (ref 0.3–1.2)
Total Protein: 6.1 g/dL — ABNORMAL LOW (ref 6.5–8.1)

## 2019-09-12 LAB — RETICULOCYTES
Immature Retic Fract: 7.4 % (ref 2.3–15.9)
RBC.: 4.59 MIL/uL (ref 3.87–5.11)
Retic Count, Absolute: 80.8 10*3/uL (ref 19.0–186.0)
Retic Ct Pct: 1.8 % (ref 0.4–3.1)

## 2019-09-12 LAB — LACTATE DEHYDROGENASE: LDH: 320 U/L — ABNORMAL HIGH (ref 98–192)

## 2019-09-12 MED ORDER — DIPHENHYDRAMINE HCL 25 MG PO CAPS
50.0000 mg | ORAL_CAPSULE | Freq: Once | ORAL | Status: AC
  Administered 2019-09-12: 50 mg via ORAL

## 2019-09-12 MED ORDER — PALONOSETRON HCL INJECTION 0.25 MG/5ML
0.2500 mg | Freq: Once | INTRAVENOUS | Status: AC
  Administered 2019-09-12: 0.25 mg via INTRAVENOUS

## 2019-09-12 MED ORDER — DIPHENHYDRAMINE HCL 25 MG PO CAPS
ORAL_CAPSULE | ORAL | Status: AC
  Filled 2019-09-12: qty 2

## 2019-09-12 MED ORDER — ACETAMINOPHEN 325 MG PO TABS
650.0000 mg | ORAL_TABLET | Freq: Once | ORAL | Status: AC
  Administered 2019-09-12: 650 mg via ORAL

## 2019-09-12 MED ORDER — SODIUM CHLORIDE 0.9 % IV SOLN
Freq: Once | INTRAVENOUS | Status: AC
  Filled 2019-09-12: qty 250

## 2019-09-12 MED ORDER — HEPARIN SOD (PORK) LOCK FLUSH 100 UNIT/ML IV SOLN
500.0000 [IU] | Freq: Once | INTRAVENOUS | Status: AC | PRN
  Administered 2019-09-12: 500 [IU]
  Filled 2019-09-12: qty 5

## 2019-09-12 MED ORDER — ACETAMINOPHEN 325 MG PO TABS
ORAL_TABLET | ORAL | Status: AC
  Filled 2019-09-12: qty 2

## 2019-09-12 MED ORDER — SODIUM CHLORIDE 0.9% FLUSH
10.0000 mL | INTRAVENOUS | Status: DC | PRN
  Administered 2019-09-12: 10 mL
  Filled 2019-09-12: qty 10

## 2019-09-12 MED ORDER — SODIUM CHLORIDE 0.9 % IV SOLN
600.0000 mg | Freq: Once | INTRAVENOUS | Status: AC
  Administered 2019-09-12: 600 mg via INTRAVENOUS
  Filled 2019-09-12: qty 10

## 2019-09-12 MED ORDER — SODIUM CHLORIDE 0.9 % IV SOLN
72.0000 mg/m2 | Freq: Once | INTRAVENOUS | Status: AC
  Administered 2019-09-12: 125 mg via INTRAVENOUS
  Filled 2019-09-12: qty 5

## 2019-09-12 MED ORDER — VITAMIN D (ERGOCALCIFEROL) 1.25 MG (50000 UNIT) PO CAPS: 50000.0000 [IU] | ORAL_CAPSULE | ORAL | 3 refills | Status: AC

## 2019-09-12 MED ORDER — PALONOSETRON HCL INJECTION 0.25 MG/5ML
INTRAVENOUS | Status: AC
  Filled 2019-09-12: qty 5

## 2019-09-12 MED ORDER — SODIUM CHLORIDE 0.9 % IV SOLN
10.0000 mg | Freq: Once | INTRAVENOUS | Status: AC
  Administered 2019-09-12: 10 mg via INTRAVENOUS
  Filled 2019-09-12: qty 10

## 2019-09-12 NOTE — Progress Notes (Signed)
Patient has had weight loss. Rituximab dose changed to reflect current weight per Dr. Antonieta Pert instructions.

## 2019-09-12 NOTE — Telephone Encounter (Signed)
Appointments scheduled calendar printed per 7/22 los

## 2019-09-12 NOTE — Addendum Note (Signed)
Addended by: [name] on: 09/12/2019 03:56 PM   Modules accepted: Orders

## 2019-09-12 NOTE — Progress Notes (Signed)
Hematology and Oncology Follow Up Visit  Kristine Sullivan [id number] 1931/10/25 86 y.o. 09/12/2019   Principle Diagnosis:  Marginal zone lymphoma -- recurrent Iron deficiency anemia Autoimmune hemolytic anemia -- warm antibody  Past therapy: CVP -- completed 8 cycles on 04/09/2012 Maintenance Rituxan - 2 years completed in June 2016 Rituxan/Cytoxan -- startedon 11/08/2018, s/p cycle 2, Cytoxan d/c'd 12/19/2018  Current Therapy: Rituxan/Bendamustine - startedon 04/17/2019, s/p cycle #4 --will omit day #2 of bendamustine for the last 2 cycles  IV Iron as needed   Interim History:  Kristine Sullivan is here today for follow-up.  It is amazing how well she is doing.  She has responded incredibly well to the Rituxan/bendamustine combination.  Her hemoglobin continues to come up.  She just feels better.  She really has had no problems with bleeding.  There is no fever.  She has had no leg swelling.  She is on diuretics right now.  There is been no problems with her appetite.  She had a very nice July 4 holiday.  She has had no rashes.  She has had no cough or shortness of breath.  There is been no episodes of angioedema.  Overall, her performance status is ECOG 1.  Medications:  Allergies as of 09/12/2019      Reactions   Lisinopril Swelling   Facial swelling    Pregabalin Other (See Comments)   constipation   Timolol Swelling, Other (See Comments)      Medication List       Accurate as of September 12, 2019  9:08 AM. If you have any questions, ask your nurse or doctor.        acyclovir 400 MG tablet Commonly known as: ZOVIRAX TAKE 1 TABLET BY MOUTH EVERY DAY   allopurinol 300 MG tablet Commonly known as: ZYLOPRIM TAKE 1 TABLET BY MOUTH EVERY DAY   apixaban 5 MG Tabs tablet Commonly known as: ELIQUIS Take 5 mg by mouth 2 (two) times daily.   aspirin 81 MG tablet Take 81 mg by mouth daily.   atorvastatin 80 MG tablet Commonly known as: LIPITOR Take 40 mg  by mouth daily.   Calcium 600-D 600-400 MG-UNIT Tabs Generic drug: Calcium Carbonate-Vitamin D3 Take by mouth.   dexamethasone 4 MG tablet Commonly known as: DECADRON Take 2 tablets (8 mg total) by mouth daily. Start the day after bendamustine chemotherapy for 2 days. Take with food.   diltiazem 90 MG 12 hr capsule Commonly known as: CARDIZEM SR Take 1 capsule (90 mg total) by mouth 2 (two) times daily.   diphenhydrAMINE 25 MG tablet Commonly known as: SOMINEX Take 25 mg by mouth daily.   dorzolamide 2 % ophthalmic solution Commonly known as: TRUSOPT Place 1 drop into both eyes 3 (three) times daily.   furosemide 40 MG tablet Commonly known as: LASIX Take 40 mg by mouth daily.   ipratropium-albuterol 0.5-2.5 (3) MG/3ML Soln Commonly known as: DUONEB INHALE 3 ML BY NEBULIZATION EVERY SIX (6) HOURS AS NEEDED.   latanoprost 0.005 % ophthalmic solution Commonly known as: XALATAN Place 1 drop into both eyes at bedtime.   lidocaine-prilocaine cream Commonly known as: EMLA Apply to affected area once   loratadine 10 MG tablet Commonly known as: CLARITIN OTC Take by mouth one a day for 10 days, then once a day as needed -allergies.   LORazepam 0.5 MG tablet Commonly known as: Ativan Take 1 tablet (0.5 mg total) by mouth every 6 (six) hours as needed (Nausea or vomiting).  ondansetron 8 MG tablet Commonly known as: Zofran Take 1 tablet (8 mg total) by mouth 2 (two) times daily as needed for refractory nausea / vomiting. Start on day 2 after bendamustine chemo.   potassium chloride 10 MEQ tablet Commonly known as: KLOR-CON Take 10 mEq by mouth daily.   prochlorperazine 10 MG tablet Commonly known as: COMPAZINE Take 1 tablet (10 mg total) by mouth every 6 (six) hours as needed (Nausea or vomiting).   spironolactone 25 MG tablet Commonly known as: ALDACTONE Take by mouth.   Vitamin D (Ergocalciferol) 1.25 MG (50000 UNIT) Caps capsule Commonly known as:  DRISDOL Take 50,000 Units by mouth once a week.       Allergies:  Allergies  Allergen Reactions  . Lisinopril Swelling    Facial swelling     . Pregabalin Other (See Comments)    constipation  . Timolol Swelling and Other (See Comments)    Past Medical History, Surgical history, Social history, and Family History were reviewed and updated.  Review of Systems: Review of Systems  Constitutional: Negative.   HENT: Negative.   Eyes: Negative.   Respiratory: Negative.   Cardiovascular: Negative.   Gastrointestinal: Negative.   Genitourinary: Negative.   Musculoskeletal: Negative.   Skin: Negative.   Neurological: Negative.   Endo/Heme/Allergies: Negative.   Psychiatric/Behavioral: Negative.      Physical Exam:  weight is 144 lb (65.3 kg). Her oral temperature is 97.7 F (36.5 C). Her blood pressure is 127/74 and her pulse is 88. Her respiration is 18 and oxygen saturation is 94%.   Wt Readings from Last 3 Encounters:  09/12/19 144 lb (65.3 kg)  08/22/19 161 lb (73 kg)  08/08/19 160 lb (72.6 kg)    Physical Exam Vitals reviewed.  HENT:     Head: Normocephalic and atraumatic.  Eyes:     Pupils: Pupils are equal, round, and reactive to light.  Cardiovascular:     Rate and Rhythm: Normal rate and regular rhythm.     Heart sounds: Normal heart sounds.  Pulmonary:     Effort: Pulmonary effort is normal.     Breath sounds: Normal breath sounds.  Abdominal:     General: Bowel sounds are normal.     Palpations: Abdomen is soft.  Musculoskeletal:        General: No tenderness or deformity. Normal range of motion.     Cervical back: Normal range of motion.  Lymphadenopathy:     Cervical: No cervical adenopathy.  Skin:    General: Skin is warm and dry.     Findings: No erythema or rash.  Neurological:     Mental Status: She is alert and oriented to person, place, and time.  Psychiatric:        Behavior: Behavior normal.        Thought Content: Thought content  normal.        Judgment: Judgment normal.      Lab Results  Component Value Date   WBC 2.6 (L) 09/12/2019   HGB 13.3 09/12/2019   HCT 41.2 09/12/2019   MCV 91.2 09/12/2019   PLT 96 (L) 09/12/2019   Lab Results  Component Value Date   FERRITIN 2,507 (H) 08/22/2019   IRON 64 08/22/2019   TIBC 254 08/22/2019   UIBC 190 08/22/2019   IRONPCTSAT 25 08/22/2019   Lab Results  Component Value Date   RETICCTPCT 1.8 09/12/2019   RBC 4.59 09/12/2019   RETICCTABS 59.2 11/13/2013   No results found  for: KPAFRELGTCHN, Vania Rea [facility] Lab Results  Component Value Date   XYVOPFYT 244 (L) 06/19/2013   IGA 100 06/19/2013   IGMSERUM 95 06/19/2013   Lab Results  Component Value Date   TOTALPROTELP 6.3 09/16/2011   ALBUMINELP 59.9 09/16/2011   A1GS 7.3 (H) 09/16/2011   A2GS 9.9 09/16/2011   BETS 8.4 (H) 09/16/2011   BETA2SER 4.8 09/16/2011   GAMS 9.7 (L) 09/16/2011   MSPIKE NOT DET 09/16/2011   SPEI * 09/16/2011     Chemistry      Component Value Date/Time   NA 141 08/22/2019 0815   NA 140 09/16/2016 0945   K 3.9 08/22/2019 0815   K 3.2 (L) 09/16/2016 0945   CL 106 08/22/2019 0815   CL 101 12/08/2014 1138   CL 99 05/28/2012 1036   CO2 28 08/22/2019 0815   CO2 26 09/16/2016 0945   BUN 10 08/22/2019 0815   BUN 9.0 09/16/2016 0945   CREATININE 0.73 08/22/2019 0815   CREATININE 0.7 09/16/2016 0945      Component Value Date/Time   CALCIUM 8.7 (L) 08/22/2019 0815   CALCIUM 9.0 09/16/2016 0945   ALKPHOS 104 08/22/2019 0815   ALKPHOS 122 09/16/2016 0945   AST 24 08/22/2019 0815   AST 25 09/16/2016 0945   ALT 22 08/22/2019 0815   ALT 15 09/16/2016 0945   BILITOT 1.0 08/22/2019 0815   BILITOT 0.90 09/16/2016 0945       Impression and Plan: Ms. Whack is a very pleasant 86 yo African American female with a history of marginal zone lymphoma, angioedema and autoimmune hemolytic anemia.  Again, the Rituxan/ bendamustine combination is working quite well.  At  this point, I will drop the second day of bendamustine.  I think this will still be effective for the hemolytic anemia and hopefully will help her blood counts get a little bit better.  We will plan for 2 more cycles of treatment, including this 1.  After that, we might think about some maintenance Rituxan for her.  We will just have to see how her anemia and hemolysis are responding.  I am just glad that her quality of life is doing so well right now.  She has an incredible faith.  She is given her testimony to as many people as possible.    [name], MD 7/22/20219:08 AM

## 2019-09-12 NOTE — Patient Instructions (Signed)
Implanted Port Insertion, Care After °This sheet gives you information about how to care for yourself after your procedure. Your health care provider may also give you more specific instructions. If you have problems or questions, contact your health care provider. °What can I expect after the procedure? °After the procedure, it is common to have: °· Discomfort at the port insertion site. °· Bruising on the skin over the port. This should improve over 3-4 days. °Follow these instructions at home: °Port care °· After your port is placed, you will get a manufacturer's information card. The card has information about your port. Keep this card with you at all times. °· Take care of the port as told by your health care provider. Ask your health care provider if you or a family member can get training for taking care of the port at home. A home health care nurse may also take care of the port. °· Make sure to remember what type of port you have. °Incision care ° °  ° °· Follow instructions from your health care provider about how to take care of your port insertion site. Make sure you: °? Wash your hands with soap and water before and after you change your bandage (dressing). If soap and water are not available, use hand sanitizer. °? Change your dressing as told by your health care provider. °? Leave stitches (sutures), skin glue, or adhesive strips in place. These skin closures may need to stay in place for 2 weeks or longer. If adhesive strip edges start to loosen and curl up, you may trim the loose edges. Do not remove adhesive strips completely unless your health care provider tells you to do that. °· Check your port insertion site every day for signs of infection. Check for: °? Redness, swelling, or pain. °? Fluid or blood. °? Warmth. °? Pus or a bad smell. °Activity °· Return to your normal activities as told by your health care provider. Ask your health care provider what activities are safe for you. °· Do not  lift anything that is heavier than 10 lb (4.5 kg), or the limit that you are told, until your health care provider says that it is safe. °General instructions °· Take over-the-counter and prescription medicines only as told by your health care provider. °· Do not take baths, swim, or use a hot tub until your health care provider approves. Ask your health care provider if you may take showers. You may only be allowed to take sponge baths. °· Do not drive for 24 hours if you were given a sedative during your procedure. °· Wear a medical alert bracelet in case of an emergency. This will tell any health care providers that you have a port. °· Keep all follow-up visits as told by your health care provider. This is important. °Contact a health care provider if: °· You cannot flush your port with saline as directed, or you cannot draw blood from the port. °· You have a fever or chills. °· You have redness, swelling, or pain around your port insertion site. °· You have fluid or blood coming from your port insertion site. °· Your port insertion site feels warm to the touch. °· You have pus or a bad smell coming from the port insertion site. °Get help right away if: °· You have chest pain or shortness of breath. °· You have bleeding from your port that you cannot control. °Summary °· Take care of the port as told by your health   care provider. Keep the manufacturer's information card with you at all times. °· Change your dressing as told by your health care provider. °· Contact a health care provider if you have a fever or chills or if you have redness, swelling, or pain around your port insertion site. °· Keep all follow-up visits as told by your health care provider. °This information is not intended to replace advice given to you by your health care provider. Make sure you discuss any questions you have with your health care provider. °Document Revised: 09/05/2017 Document Reviewed: 09/05/2017 °Elsevier Patient Education ©  2020 [employer] ° °

## 2019-09-12 NOTE — Patient Instructions (Addendum)
Bendamustine Injection What is this medicine? BENDAMUSTINE (BEN da MUS teen) is a chemotherapy drug. It is used to treat chronic lymphocytic leukemia and non-Hodgkin lymphoma. This medicine may be used for other purposes; ask your health care provider or pharmacist if you have questions. COMMON BRAND NAME(S): BELRAPZO, BENDEKA, Treanda What should I tell my health care provider before I take this medicine? They need to know if you have any of these conditions:  infection (especially a virus infection such as chickenpox, cold sores, or herpes)  kidney disease  liver disease  an unusual or allergic reaction to bendamustine, mannitol, other medicines, foods, dyes, or preservatives  pregnant or trying to get pregnant  breast-feeding How should I use this medicine? This medicine is for infusion into a vein. It is given by a health care professional in a hospital or clinic setting. Talk to your pediatrician regarding the use of this medicine in children. Special care may be needed. Overdosage: If you think you have taken too much of this medicine contact a poison control center or emergency room at once. NOTE: This medicine is only for you. Do not share this medicine with others. What if I miss a dose? It is important not to miss your dose. Call your doctor or health care professional if you are unable to keep an appointment. What may interact with this medicine? Do not take this medicine with any of the following medications:  clozapine This medicine may also interact with the following medications:  atazanavir  cimetidine  ciprofloxacin  enoxacin  fluvoxamine  medicines for seizures like carbamazepine and phenobarbital  mexiletine  rifampin  tacrine  thiabendazole  zileuton This list may not describe all possible interactions. Give your health care provider a list of all the medicines, herbs, non-prescription drugs, or dietary supplements you use. Also tell them if  you smoke, drink alcohol, or use illegal drugs. Some items may interact with your medicine. What should I watch for while using this medicine? This drug may make you feel generally unwell. This is not uncommon, as chemotherapy can affect healthy cells as well as cancer cells. Report any side effects. Continue your course of treatment even though you feel ill unless your doctor tells you to stop. You may need blood work done while you are taking this medicine. Call your doctor or healthcare provider for advice if you get a fever, chills or sore throat, or other symptoms of a cold or flu. Do not treat yourself. This drug decreases your body's ability to fight infections. Try to avoid being around people who are sick. This medicine may cause serious skin reactions. They can happen weeks to months after starting the medicine. Contact your healthcare provider right away if you notice fevers or flu-like symptoms with a rash. The rash may be red or purple and then turn into blisters or peeling of the skin. Or, you might notice a red rash with swelling of the face, lips or lymph nodes in your neck or under your arms. This medicine may increase your risk to bruise or bleed. Call your doctor or healthcare provider if you notice any unusual bleeding. Talk to your doctor about your risk of cancer. You may be more at risk for certain types of cancers if you take this medicine. Do not become pregnant while taking this medicine or for at least 6 months after stopping it. Women should inform their doctor if they wish to become pregnant or think they might be pregnant. Men should not   father a child while taking this medicine and for at least 3 months after stopping it. There is a potential for serious side effects to an unborn child. Talk to your healthcare provider or pharmacist for more information. Do not breast-feed an infant while taking this medicine or for at least 1 week after stopping it. This medicine may make it  more difficult to father a child. You should talk with your doctor or healthcare provider if you are concerned about your fertility. What side effects may I notice from receiving this medicine? Side effects that you should report to your doctor or health care professional as soon as possible:  allergic reactions like skin rash, itching or hives, swelling of the face, lips, or tongue  low blood counts - this medicine may decrease the number of white blood cells, red blood cells and platelets. You may be at increased risk for infections and bleeding.  rash, fever, and swollen lymph nodes  redness, blistering, peeling, or loosening of the skin, including inside the mouth  signs of infection like fever or chills, cough, sore throat, pain or difficulty passing urine  signs of decreased platelets or bleeding like bruising, pinpoint red spots on the skin, black, tarry stools, blood in the urine  signs of decreased red blood cells like being unusually weak or tired, fainting spells, lightheadedness  signs and symptoms of kidney injury like trouble passing urine or change in the amount of urine  signs and symptoms of liver injury like dark yellow or brown urine; general ill feeling or flu-like symptoms; light-colored stools; loss of appetite; nausea; right upper belly pain; unusually weak or tired; yellowing of the eyes or skin Side effects that usually do not require medical attention (report to your doctor or health care professional if they continue or are bothersome):  constipation  decreased appetite  diarrhea  headache  mouth sores  nausea, vomiting  tiredness This list may not describe all possible side effects. Call your doctor for medical advice about side effects. You may report side effects to FDA at 1-800-FDA-1088. Where should I keep my medicine? This drug is given in a hospital or clinic and will not be stored at home. NOTE: This sheet is a summary. It may not cover all  possible information. If you have questions about this medicine, talk to your doctor, pharmacist, or health care provider.  2020 Elsevier/Gold Standard (2018-05-01 10:26:46) Rituximab injection What is this medicine? RITUXIMAB (ri TUX i mab) is a monoclonal antibody. It is used to treat certain types of cancer like non-Hodgkin lymphoma and chronic lymphocytic leukemia. It is also used to treat rheumatoid arthritis, granulomatosis with polyangiitis (or Wegener's granulomatosis), microscopic polyangiitis, and pemphigus vulgaris. This medicine may be used for other purposes; ask your health care provider or pharmacist if you have questions. COMMON BRAND NAME(S): Rituxan, RUXIENCE What should I tell my health care provider before I take this medicine? They need to know if you have any of these conditions:  heart disease  infection (especially a virus infection such as hepatitis B, chickenpox, cold sores, or herpes)  immune system problems  irregular heartbeat  kidney disease  low blood counts, like low white cell, platelet, or red cell counts  lung or breathing disease, like asthma  recently received or scheduled to receive a vaccine  an unusual or allergic reaction to rituximab, other medicines, foods, dyes, or preservatives  pregnant or trying to get pregnant  breast-feeding How should I use this medicine? This medicine is  for infusion into a vein. It is administered in a hospital or clinic by a specially trained health care professional. A special MedGuide will be given to you by the pharmacist with each prescription and refill. Be sure to read this information carefully each time. Talk to your pediatrician regarding the use of this medicine in children. This medicine is not approved for use in children. Overdosage: If you think you have taken too much of this medicine contact a poison control center or emergency room at once. NOTE: This medicine is only for you. Do not share this  medicine with others. What if I miss a dose? It is important not to miss a dose. Call your doctor or health care professional if you are unable to keep an appointment. What may interact with this medicine?  cisplatin  live virus vaccines This list may not describe all possible interactions. Give your health care provider a list of all the medicines, herbs, non-prescription drugs, or dietary supplements you use. Also tell them if you smoke, drink alcohol, or use illegal drugs. Some items may interact with your medicine. What should I watch for while using this medicine? Your condition will be monitored carefully while you are receiving this medicine. You may need blood work done while you are taking this medicine. This medicine can cause serious allergic reactions. To reduce your risk you may need to take medicine before treatment with this medicine. Take your medicine as directed. In some patients, this medicine may cause a serious brain infection that may cause death. If you have any problems seeing, thinking, speaking, walking, or standing, tell your healthcare professional right away. If you cannot reach your healthcare professional, urgently seek other source of medical care. Call your doctor or health care professional for advice if you get a fever, chills or sore throat, or other symptoms of a cold or flu. Do not treat yourself. This drug decreases your body's ability to fight infections. Try to avoid being around people who are sick. Do not become pregnant while taking this medicine or for at least 12 months after stopping it. Women should inform their doctor if they wish to become pregnant or think they might be pregnant. There is a potential for serious side effects to an unborn child. Talk to your health care professional or pharmacist for more information. Do not breast-feed an infant while taking this medicine or for at least 6 months after stopping it. What side effects may I notice from  receiving this medicine? Side effects that you should report to your doctor or health care professional as soon as possible:  allergic reactions like skin rash, itching or hives; swelling of the face, lips, or tongue  breathing problems  chest pain  changes in vision  diarrhea  headache with fever, neck stiffness, sensitivity to light, nausea, or confusion  fast, irregular heartbeat  loss of memory  low blood counts - this medicine may decrease the number of white blood cells, red blood cells and platelets. You may be at increased risk for infections and bleeding.  mouth sores  problems with balance, talking, or walking  redness, blistering, peeling or loosening of the skin, including inside the mouth  signs of infection - fever or chills, cough, sore throat, pain or difficulty passing urine  signs and symptoms of kidney injury like trouble passing urine or change in the amount of urine  signs and symptoms of liver injury like dark yellow or brown urine; general ill feeling or flu-like  symptoms; light-colored stools; loss of appetite; nausea; right upper belly pain; unusually weak or tired; yellowing of the eyes or skin  signs and symptoms of low blood pressure like dizziness; feeling faint or lightheaded, falls; unusually weak or tired  stomach pain  swelling of the ankles, feet, hands  unusual bleeding or bruising  vomiting Side effects that usually do not require medical attention (report to your doctor or health care professional if they continue or are bothersome):  headache  joint pain  muscle cramps or muscle pain  nausea  tiredness This list may not describe all possible side effects. Call your doctor for medical advice about side effects. You may report side effects to FDA at 1-800-FDA-1088. Where should I keep my medicine? This drug is given in a hospital or clinic and will not be stored at home. NOTE: This sheet is a summary. It may not cover all  possible information. If you have questions about this medicine, talk to your doctor, pharmacist, or health care provider.  2020 Elsevier/Gold Standard (2018-03-21 22:01:36)  

## 2019-09-13 ENCOUNTER — Inpatient Hospital Stay: Payer: Medicare Other

## 2019-10-10 ENCOUNTER — Encounter: Payer: Self-pay | Admitting: Hematology & Oncology

## 2019-10-10 ENCOUNTER — Inpatient Hospital Stay: Payer: Medicare Other

## 2019-10-10 ENCOUNTER — Inpatient Hospital Stay: Payer: Medicare Other | Attending: Hematology & Oncology

## 2019-10-10 ENCOUNTER — Telehealth: Payer: Self-pay | Admitting: *Deleted

## 2019-10-10 ENCOUNTER — Inpatient Hospital Stay (HOSPITAL_BASED_OUTPATIENT_CLINIC_OR_DEPARTMENT_OTHER): Payer: Medicare Other | Admitting: Hematology & Oncology

## 2019-10-10 ENCOUNTER — Telehealth: Payer: Self-pay | Admitting: Hematology & Oncology

## 2019-10-10 ENCOUNTER — Other Ambulatory Visit: Payer: Self-pay

## 2019-10-10 VITALS — BP 125/60 | HR 78 | Temp 98.7°F | Resp 18 | Wt 143.0 lb

## 2019-10-10 DIAGNOSIS — C8300 Small cell B-cell lymphoma, unspecified site: Secondary | ICD-10-CM | POA: Insufficient documentation

## 2019-10-10 DIAGNOSIS — C8307 Small cell B-cell lymphoma, spleen: Secondary | ICD-10-CM

## 2019-10-10 DIAGNOSIS — Z5112 Encounter for antineoplastic immunotherapy: Secondary | ICD-10-CM | POA: Insufficient documentation

## 2019-10-10 DIAGNOSIS — Z95828 Presence of other vascular implants and grafts: Secondary | ICD-10-CM

## 2019-10-10 DIAGNOSIS — D509 Iron deficiency anemia, unspecified: Secondary | ICD-10-CM | POA: Insufficient documentation

## 2019-10-10 DIAGNOSIS — D5911 Warm autoimmune hemolytic anemia: Secondary | ICD-10-CM

## 2019-10-10 DIAGNOSIS — G629 Polyneuropathy, unspecified: Secondary | ICD-10-CM | POA: Diagnosis not present

## 2019-10-10 LAB — CBC WITH DIFFERENTIAL (CANCER CENTER ONLY)
Abs Immature Granulocytes: 0.02 10*3/uL (ref 0.00–0.07)
Basophils Absolute: 0 10*3/uL (ref 0.0–0.1)
Basophils Relative: 2 %
Eosinophils Absolute: 0 10*3/uL (ref 0.0–0.5)
Eosinophils Relative: 2 %
HCT: 37.3 % (ref 36.0–46.0)
Hemoglobin: 12.4 g/dL (ref 12.0–15.0)
Immature Granulocytes: 2 %
Lymphocytes Relative: 35 %
Lymphs Abs: 0.4 10*3/uL — ABNORMAL LOW (ref 0.7–4.0)
MCH: 29.3 pg (ref 26.0–34.0)
MCHC: 33.2 g/dL (ref 30.0–36.0)
MCV: 88.2 fL (ref 80.0–100.0)
Monocytes Absolute: 0.5 10*3/uL (ref 0.1–1.0)
Monocytes Relative: 47 %
Neutro Abs: 0.1 10*3/uL — CL (ref 1.7–7.7)
Neutrophils Relative %: 12 %
Platelet Count: 103 10*3/uL — ABNORMAL LOW (ref 150–400)
RBC: 4.23 MIL/uL (ref 3.87–5.11)
RDW: 14.2 % (ref 11.5–15.5)
WBC Count: 1 10*3/uL — ABNORMAL LOW (ref 4.0–10.5)
nRBC: 0 % (ref 0.0–0.2)

## 2019-10-10 LAB — RETICULOCYTES
Immature Retic Fract: 14.3 % (ref 2.3–15.9)
RBC.: 4.32 MIL/uL (ref 3.87–5.11)
Retic Count, Absolute: 109.3 10*3/uL (ref 19.0–186.0)
Retic Ct Pct: 2.5 % (ref 0.4–3.1)

## 2019-10-10 LAB — CMP (CANCER CENTER ONLY)
ALT: 20 U/L (ref 0–44)
AST: 22 U/L (ref 15–41)
Albumin: 3.8 g/dL (ref 3.5–5.0)
Alkaline Phosphatase: 104 U/L (ref 38–126)
Anion gap: 9 (ref 5–15)
BUN: 11 mg/dL (ref 8–23)
CO2: 26 mmol/L (ref 22–32)
Calcium: 9.2 mg/dL (ref 8.9–10.3)
Chloride: 105 mmol/L (ref 98–111)
Creatinine: 0.67 mg/dL (ref 0.44–1.00)
GFR, Est AFR Am: >60 mL/min (ref >60)
GFR, Estimated: >60 mL/min (ref >60)
Glucose, Bld: 112 mg/dL — ABNORMAL HIGH (ref 70–99)
Potassium: 3.6 mmol/L (ref 3.5–5.1)
Sodium: 140 mmol/L (ref 135–145)
Total Bilirubin: 1.2 mg/dL (ref 0.3–1.2)
Total Protein: 6 g/dL — ABNORMAL LOW (ref 6.5–8.1)

## 2019-10-10 LAB — IRON AND TIBC
Iron: 97 ug/dL (ref 41–142)
Saturation Ratios: 43 % (ref 21–57)
TIBC: 225 ug/dL — ABNORMAL LOW (ref 236–444)
UIBC: 129 ug/dL (ref 120–384)

## 2019-10-10 LAB — FERRITIN: Ferritin: 4932 ng/mL — ABNORMAL HIGH (ref 11–307)

## 2019-10-10 LAB — LACTATE DEHYDROGENASE: LDH: 311 U/L — ABNORMAL HIGH (ref 98–192)

## 2019-10-10 MED ORDER — SODIUM CHLORIDE 0.9% FLUSH
10.0000 mL | INTRAVENOUS | Status: AC | PRN
  Administered 2019-10-10: 10 mL via INTRAVENOUS
  Filled 2019-10-10: qty 10

## 2019-10-10 MED ORDER — HEPARIN SOD (PORK) LOCK FLUSH 100 UNIT/ML IV SOLN
500.0000 [IU] | Freq: Once | INTRAVENOUS | Status: AC
  Administered 2019-10-10: 500 [IU] via INTRAVENOUS
  Filled 2019-10-10: qty 5

## 2019-10-10 NOTE — Patient Instructions (Signed)

## 2019-10-10 NOTE — Addendum Note (Signed)
Addended by: Melton Krebs on: 10/10/2019 08:40 AM   Modules accepted: Orders, SmartSet

## 2019-10-10 NOTE — Progress Notes (Signed)
Hematology and Oncology Follow Up Visit  Camya Haydon [id number] March 31, 1931 86 y.o. 10/10/2019   Principle Diagnosis:  Marginal zone lymphoma -- recurrent Iron deficiency anemia Autoimmune hemolytic anemia -- warm antibody  Past therapy: CVP -- completed 8 cycles on 04/09/2012 Maintenance Rituxan - 2 years completed in June 2016 Rituxan/Cytoxan -- startedon 11/08/2018, s/p cycle 2, Cytoxan d/c'd 12/19/2018  Current Therapy: Rituxan/Bendamustine - startedon 04/17/2019, s/p cycle #5 --will omit day #2 of bendamustine for the last 2 cycles  IV Iron as needed   Interim History:  Ms. Riolo is here today for follow-up.  She feels well.  She has had no problems since we last saw her.  She has had no fatigue or weakness.  Her appetite is doing well.  She has had no leg swelling.  She has had no rashes.  There is been no mouth sores.  She has had no swelling of the tongue or other symptoms of angioedema.  She is really done nicely with the Rituxan/bendamustine combination.  Her reticulocyte count has been staying nice and low.  Overall, her performance status is ECOG 1.  Medications:  Allergies as of 10/10/2019      Reactions   Lisinopril Swelling   Facial swelling    Pregabalin Other (See Comments)   constipation   Timolol Swelling, Other (See Comments)      Medication List       Accurate as of October 10, 2019  8:14 AM. If you have any questions, ask your nurse or doctor.        acyclovir 400 MG tablet Commonly known as: ZOVIRAX TAKE 1 TABLET BY MOUTH EVERY DAY   allopurinol 300 MG tablet Commonly known as: ZYLOPRIM TAKE 1 TABLET BY MOUTH EVERY DAY   apixaban 5 MG Tabs tablet Commonly known as: ELIQUIS Take 5 mg by mouth 2 (two) times daily.   aspirin 81 MG tablet Take 81 mg by mouth daily.   atorvastatin 80 MG tablet Commonly known as: LIPITOR Take 40 mg by mouth daily.   Calcium 600-D 600-400 MG-UNIT Tabs Generic drug: Calcium  Carbonate-Vitamin D3 Take by mouth.   dexamethasone 4 MG tablet Commonly known as: DECADRON Take 2 tablets (8 mg total) by mouth daily. Start the day after bendamustine chemotherapy for 2 days. Take with food.   diltiazem 90 MG 12 hr capsule Commonly known as: CARDIZEM SR Take 1 capsule (90 mg total) by mouth 2 (two) times daily.   diphenhydrAMINE 25 MG tablet Commonly known as: SOMINEX Take 25 mg by mouth daily.   dorzolamide 2 % ophthalmic solution Commonly known as: TRUSOPT Place 1 drop into both eyes 3 (three) times daily.   furosemide 40 MG tablet Commonly known as: LASIX Take 40 mg by mouth daily.   ipratropium-albuterol 0.5-2.5 (3) MG/3ML Soln Commonly known as: DUONEB INHALE 3 ML BY NEBULIZATION EVERY SIX (6) HOURS AS NEEDED.   latanoprost 0.005 % ophthalmic solution Commonly known as: XALATAN Place 1 drop into both eyes at bedtime.   lidocaine-prilocaine cream Commonly known as: EMLA Apply to affected area once   loratadine 10 MG tablet Commonly known as: CLARITIN OTC Take by mouth one a day for 10 days, then once a day as needed -allergies.   LORazepam 0.5 MG tablet Commonly known as: Ativan Take 1 tablet (0.5 mg total) by mouth every 6 (six) hours as needed (Nausea or vomiting).   ondansetron 8 MG tablet Commonly known as: Zofran Take 1 tablet (8 mg total) by mouth 2 (  two) times daily as needed for refractory nausea / vomiting. Start on day 2 after bendamustine chemo.   potassium chloride 10 MEQ tablet Commonly known as: KLOR-CON Take 10 mEq by mouth daily.   prochlorperazine 10 MG tablet Commonly known as: COMPAZINE Take 1 tablet (10 mg total) by mouth every 6 (six) hours as needed (Nausea or vomiting).   spironolactone 25 MG tablet Commonly known as: ALDACTONE Take by mouth.   Vitamin D (Ergocalciferol) 1.25 MG (50000 UNIT) Caps capsule Commonly known as: DRISDOL Take 1 capsule (50,000 Units total) by mouth once a week.       Allergies:    Allergies  Allergen Reactions  . Lisinopril Swelling    Facial swelling     . Pregabalin Other (See Comments)    constipation  . Timolol Swelling and Other (See Comments)    Past Medical History, Surgical history, Social history, and Family History were reviewed and updated.  Review of Systems: Review of Systems  Constitutional: Negative.   HENT: Negative.   Eyes: Negative.   Respiratory: Negative.   Cardiovascular: Negative.   Gastrointestinal: Negative.   Genitourinary: Negative.   Musculoskeletal: Negative.   Skin: Negative.   Neurological: Negative.   Endo/Heme/Allergies: Negative.   Psychiatric/Behavioral: Negative.      Physical Exam:  vitals were not taken for this visit.   Wt Readings from Last 3 Encounters:  09/12/19 144 lb (65.3 kg)  08/22/19 161 lb (73 kg)  08/08/19 160 lb (72.6 kg)    Physical Exam Vitals reviewed.  HENT:     Head: Normocephalic and atraumatic.  Eyes:     Pupils: Pupils are equal, round, and reactive to light.  Cardiovascular:     Rate and Rhythm: Normal rate and regular rhythm.     Heart sounds: Normal heart sounds.  Pulmonary:     Effort: Pulmonary effort is normal.     Breath sounds: Normal breath sounds.  Abdominal:     General: Bowel sounds are normal.     Palpations: Abdomen is soft.  Musculoskeletal:        General: No tenderness or deformity. Normal range of motion.     Cervical back: Normal range of motion.  Lymphadenopathy:     Cervical: No cervical adenopathy.  Skin:    General: Skin is warm and dry.     Findings: No erythema or rash.  Neurological:     Mental Status: She is alert and oriented to person, place, and time.  Psychiatric:        Behavior: Behavior normal.        Thought Content: Thought content normal.        Judgment: Judgment normal.      Lab Results  Component Value Date   WBC 2.6 (L) 09/12/2019   HGB 13.3 09/12/2019   HCT 41.2 09/12/2019   MCV 91.2 09/12/2019   PLT 96 (L)  09/12/2019   Lab Results  Component Value Date   FERRITIN 2,507 (H) 08/22/2019   IRON 64 08/22/2019   TIBC 254 08/22/2019   UIBC 190 08/22/2019   IRONPCTSAT 25 08/22/2019   Lab Results  Component Value Date   RETICCTPCT 1.8 09/12/2019   RBC 4.59 09/12/2019   RETICCTABS 59.2 11/13/2013   No results found for: KPAFRELGTCHN, LAMBDASER, KAPLAMBRATIO Lab Results  Component Value Date   IGGSERUM 557 (L) 06/19/2013   IGA 100 06/19/2013   IGMSERUM 95 06/19/2013   Lab Results  Component Value Date   TOTALPROTELP 6.3 09/16/2011  ALBUMINELP 59.9 09/16/2011   A1GS 7.3 (H) 09/16/2011   A2GS 9.9 09/16/2011   BETS 8.4 (H) 09/16/2011   BETA2SER 4.8 09/16/2011   GAMS 9.7 (L) 09/16/2011   MSPIKE NOT DET 09/16/2011   SPEI * 09/16/2011     Chemistry      Component Value Date/Time   NA 140 09/12/2019 0841   NA 140 09/16/2016 0945   K 4.1 09/12/2019 0841   K 3.2 (L) 09/16/2016 0945   CL 107 09/12/2019 0841   CL 101 12/08/2014 1138   CL 99 05/28/2012 1036   CO2 27 09/12/2019 0841   CO2 26 09/16/2016 0945   BUN 13 09/12/2019 0841   BUN 9.0 09/16/2016 0945   CREATININE 0.66 09/12/2019 0841   CREATININE 0.7 09/16/2016 0945      Component Value Date/Time   CALCIUM 9.1 09/12/2019 0841   CALCIUM 9.0 09/16/2016 0945   ALKPHOS 123 09/12/2019 0841   ALKPHOS 122 09/16/2016 0945   AST 23 09/12/2019 0841   AST 25 09/16/2016 0945   ALT 24 09/12/2019 0841   ALT 15 09/16/2016 0945   BILITOT 0.8 09/12/2019 0841   BILITOT 0.90 09/16/2016 0945       Impression and Plan: Ms. Valenza is a very pleasant 86 yo African American female with a history of marginal zone lymphoma, angioedema and autoimmune hemolytic anemia.  Again, the Rituxan/ bendamustine combination is working quite well.  Her white cell count is just too low today for me to treat her.  I know that her monocytes are up quite nicely.  This usually indicates that the white cells are coming back up.  We will move her back in 1  week.  I think this would be very reasonable.  Thankfully, we have only given her 1 day of bendamustine and not 2 days.  We will plan to get her back to see [place] in about a month or so after her next cycle next week.     [name], MD 8/19/20218:14 AM

## 2019-10-10 NOTE — Telephone Encounter (Signed)
Appointments scheduled calendar printed per 8/19 los 

## 2019-10-10 NOTE — Telephone Encounter (Signed)
Dr. Marin Olp notified of ANC-0.1 and WBC-1.0.  No new orders received at this time.

## 2019-10-14 ENCOUNTER — Ambulatory Visit: Payer: Medicare Other | Admitting: Hematology & Oncology

## 2019-10-14 ENCOUNTER — Other Ambulatory Visit: Payer: Medicare Other

## 2019-10-14 ENCOUNTER — Ambulatory Visit: Payer: Medicare Other

## 2019-10-18 ENCOUNTER — Encounter: Payer: Self-pay | Admitting: Hematology & Oncology

## 2019-10-18 ENCOUNTER — Other Ambulatory Visit: Payer: Self-pay

## 2019-10-18 ENCOUNTER — Inpatient Hospital Stay (HOSPITAL_BASED_OUTPATIENT_CLINIC_OR_DEPARTMENT_OTHER): Payer: Medicare Other | Admitting: Hematology & Oncology

## 2019-10-18 ENCOUNTER — Inpatient Hospital Stay: Payer: Medicare Other

## 2019-10-18 VITALS — BP 129/80 | HR 85 | Temp 97.8°F | Resp 17 | Wt 148.0 lb

## 2019-10-18 VITALS — BP 125/60 | HR 62 | Resp 16

## 2019-10-18 DIAGNOSIS — C8307 Small cell B-cell lymphoma, spleen: Secondary | ICD-10-CM

## 2019-10-18 DIAGNOSIS — D5911 Warm autoimmune hemolytic anemia: Secondary | ICD-10-CM

## 2019-10-18 DIAGNOSIS — Z5112 Encounter for antineoplastic immunotherapy: Secondary | ICD-10-CM | POA: Diagnosis not present

## 2019-10-18 LAB — CBC WITH DIFFERENTIAL (CANCER CENTER ONLY)
Abs Immature Granulocytes: 0.1 10*3/uL — ABNORMAL HIGH (ref 0.00–0.07)
Basophils Absolute: 0 10*3/uL (ref 0.0–0.1)
Basophils Relative: 1 %
Eosinophils Absolute: 0 10*3/uL (ref 0.0–0.5)
Eosinophils Relative: 1 %
HCT: 37.8 % (ref 36.0–46.0)
Hemoglobin: 12.2 g/dL (ref 12.0–15.0)
Immature Granulocytes: 3 %
Lymphocytes Relative: 10 %
Lymphs Abs: 0.3 10*3/uL — ABNORMAL LOW (ref 0.7–4.0)
MCH: 29 pg (ref 26.0–34.0)
MCHC: 32.3 g/dL (ref 30.0–36.0)
MCV: 89.8 fL (ref 80.0–100.0)
Monocytes Absolute: 0.5 10*3/uL (ref 0.1–1.0)
Monocytes Relative: 17 %
Neutro Abs: 2.2 10*3/uL (ref 1.7–7.7)
Neutrophils Relative %: 68 %
Platelet Count: 114 10*3/uL — ABNORMAL LOW (ref 150–400)
RBC: 4.21 MIL/uL (ref 3.87–5.11)
RDW: 14.2 % (ref 11.5–15.5)
WBC Count: 3.3 10*3/uL — ABNORMAL LOW (ref 4.0–10.5)
nRBC: 0 % (ref 0.0–0.2)

## 2019-10-18 LAB — CMP (CANCER CENTER ONLY)
ALT: 15 U/L (ref 0–44)
AST: 19 U/L (ref 15–41)
Albumin: 3.6 g/dL (ref 3.5–5.0)
Alkaline Phosphatase: 105 U/L (ref 38–126)
Anion gap: 7 (ref 5–15)
BUN: 7 mg/dL — ABNORMAL LOW (ref 8–23)
CO2: 29 mmol/L (ref 22–32)
Calcium: 8.9 mg/dL (ref 8.9–10.3)
Chloride: 106 mmol/L (ref 98–111)
Creatinine: 0.69 mg/dL (ref 0.44–1.00)
GFR, Est AFR Am: >60 mL/min (ref >60)
GFR, Estimated: >60 mL/min (ref >60)
Glucose, Bld: 105 mg/dL — ABNORMAL HIGH (ref 70–99)
Potassium: 3.7 mmol/L (ref 3.5–5.1)
Sodium: 142 mmol/L (ref 135–145)
Total Bilirubin: 0.7 mg/dL (ref 0.3–1.2)
Total Protein: 5.7 g/dL — ABNORMAL LOW (ref 6.5–8.1)

## 2019-10-18 LAB — RETICULOCYTES
Immature Retic Fract: 20.1 % — ABNORMAL HIGH (ref 2.3–15.9)
RBC.: 4.22 MIL/uL (ref 3.87–5.11)
Retic Count, Absolute: 101.3 10*3/uL (ref 19.0–186.0)
Retic Ct Pct: 2.4 % (ref 0.4–3.1)

## 2019-10-18 LAB — IRON AND TIBC
Iron: 125 ug/dL (ref 41–142)
Saturation Ratios: 49 % (ref 21–57)
TIBC: 255 ug/dL (ref 236–444)
UIBC: 130 ug/dL (ref 120–384)

## 2019-10-18 LAB — LACTATE DEHYDROGENASE: LDH: 346 U/L — ABNORMAL HIGH (ref 98–192)

## 2019-10-18 LAB — FERRITIN: Ferritin: 2956 ng/mL — ABNORMAL HIGH (ref 11–307)

## 2019-10-18 MED ORDER — SODIUM CHLORIDE 0.9 % IV SOLN
600.0000 mg | Freq: Once | INTRAVENOUS | Status: AC
  Administered 2019-10-18: 600 mg via INTRAVENOUS
  Filled 2019-10-18: qty 50

## 2019-10-18 MED ORDER — ACETAMINOPHEN 325 MG PO TABS
ORAL_TABLET | ORAL | Status: AC
  Filled 2019-10-18: qty 2

## 2019-10-18 MED ORDER — SODIUM CHLORIDE 0.9 % IV SOLN
72.0000 mg/m2 | Freq: Once | INTRAVENOUS | Status: AC
  Administered 2019-10-18: 125 mg via INTRAVENOUS
  Filled 2019-10-18: qty 5

## 2019-10-18 MED ORDER — PALONOSETRON HCL INJECTION 0.25 MG/5ML
0.2500 mg | Freq: Once | INTRAVENOUS | Status: AC
  Administered 2019-10-18: 0.25 mg via INTRAVENOUS

## 2019-10-18 MED ORDER — DIPHENHYDRAMINE HCL 25 MG PO CAPS
50.0000 mg | ORAL_CAPSULE | Freq: Once | ORAL | Status: AC
  Administered 2019-10-18: 50 mg via ORAL

## 2019-10-18 MED ORDER — SODIUM CHLORIDE 0.9 % IV SOLN
10.0000 mg | Freq: Once | INTRAVENOUS | Status: AC
  Administered 2019-10-18: 10 mg via INTRAVENOUS
  Filled 2019-10-18: qty 10

## 2019-10-18 MED ORDER — DIPHENHYDRAMINE HCL 25 MG PO CAPS
ORAL_CAPSULE | ORAL | Status: AC
  Filled 2019-10-18: qty 2

## 2019-10-18 MED ORDER — HEPARIN SOD (PORK) LOCK FLUSH 100 UNIT/ML IV SOLN
500.0000 [IU] | Freq: Once | INTRAVENOUS | Status: AC | PRN
  Administered 2019-10-18: 500 [IU]
  Filled 2019-10-18: qty 5

## 2019-10-18 MED ORDER — SODIUM CHLORIDE 0.9% FLUSH
10.0000 mL | INTRAVENOUS | Status: DC | PRN
  Administered 2019-10-18: 10 mL
  Filled 2019-10-18: qty 10

## 2019-10-18 MED ORDER — PALONOSETRON HCL INJECTION 0.25 MG/5ML
INTRAVENOUS | Status: AC
  Filled 2019-10-18: qty 5

## 2019-10-18 MED ORDER — SODIUM CHLORIDE 0.9 % IV SOLN
Freq: Once | INTRAVENOUS | Status: AC
  Filled 2019-10-18: qty 250

## 2019-10-18 MED ORDER — ACETAMINOPHEN 325 MG PO TABS
650.0000 mg | ORAL_TABLET | Freq: Once | ORAL | Status: AC
  Administered 2019-10-18: 650 mg via ORAL

## 2019-10-18 NOTE — Patient Instructions (Signed)

## 2019-10-18 NOTE — Patient Instructions (Signed)
Bendamustine Injection What is this medicine? BENDAMUSTINE (BEN da MUS teen) is a chemotherapy drug. It is used to treat chronic lymphocytic leukemia and non-Hodgkin lymphoma. This medicine may be used for other purposes; ask your health care provider or pharmacist if you have questions. COMMON BRAND NAME(S): Kristine Royal, Treanda What should I tell my health care provider before I take this medicine? They need to know if you have any of these conditions:  infection (especially a virus infection such as chickenpox, cold sores, or herpes)  kidney disease  liver disease  an unusual or allergic reaction to bendamustine, mannitol, other medicines, foods, dyes, or preservatives  pregnant or trying to get pregnant  breast-feeding How should I use this medicine? This medicine is for infusion into a vein. It is given by a health care professional in a hospital or clinic setting. Talk to your pediatrician regarding the use of this medicine in children. Special care may be needed. Overdosage: If you think you have taken too much of this medicine contact a poison control center or emergency room at once. NOTE: This medicine is only for you. Do not share this medicine with others. What if I miss a dose? It is important not to miss your dose. Call your doctor or health care professional if you are unable to keep an appointment. What may interact with this medicine? Do not take this medicine with any of the following medications:  clozapine This medicine may also interact with the following medications:  atazanavir  cimetidine  ciprofloxacin  enoxacin  fluvoxamine  medicines for seizures like carbamazepine and phenobarbital  mexiletine  rifampin  tacrine  thiabendazole  zileuton This list may not describe all possible interactions. Give your health care provider a list of all the medicines, herbs, non-prescription drugs, or dietary supplements you use. Also tell them if  you smoke, drink alcohol, or use illegal drugs. Some items may interact with your medicine. What should I watch for while using this medicine? This drug may make you feel generally unwell. This is not uncommon, as chemotherapy can affect healthy cells as well as cancer cells. Report any side effects. Continue your course of treatment even though you feel ill unless your doctor tells you to stop. You may need blood work done while you are taking this medicine. Call your doctor or healthcare provider for advice if you get a fever, chills or sore throat, or other symptoms of a cold or flu. Do not treat yourself. This drug decreases your body's ability to fight infections. Try to avoid being around people who are sick. This medicine may cause serious skin reactions. They can happen weeks to months after starting the medicine. Contact your healthcare provider right away if you notice fevers or flu-like symptoms with a rash. The rash may be red or purple and then turn into blisters or peeling of the skin. Or, you might notice a red rash with swelling of the face, lips or lymph nodes in your neck or under your arms. This medicine may increase your risk to bruise or bleed. Call your doctor or healthcare provider if you notice any unusual bleeding. Talk to your doctor about your risk of cancer. You may be more at risk for certain types of cancers if you take this medicine. Do not become pregnant while taking this medicine or for at least 6 months after stopping it. Women should inform their doctor if they wish to become pregnant or think they might be pregnant. Men should not  father a child while taking this medicine and for at least 3 months after stopping it. There is a potential for serious side effects to an unborn child. Talk to your healthcare provider or pharmacist for more information. Do not breast-feed an infant while taking this medicine or for at least 1 week after stopping it. This medicine may make it  more difficult to father a child. You should talk with your doctor or healthcare provider if you are concerned about your fertility. What side effects may I notice from receiving this medicine? Side effects that you should report to your doctor or health care professional as soon as possible:  allergic reactions like skin rash, itching or hives, swelling of the face, lips, or tongue  low blood counts - this medicine may decrease the number of white blood cells, red blood cells and platelets. You may be at increased risk for infections and bleeding.  rash, fever, and swollen lymph nodes  redness, blistering, peeling, or loosening of the skin, including inside the mouth  signs of infection like fever or chills, cough, sore throat, pain or difficulty passing urine  signs of decreased platelets or bleeding like bruising, pinpoint red spots on the skin, black, tarry stools, blood in the urine  signs of decreased red blood cells like being unusually weak or tired, fainting spells, lightheadedness  signs and symptoms of kidney injury like trouble passing urine or change in the amount of urine  signs and symptoms of liver injury like dark yellow or brown urine; general ill feeling or flu-like symptoms; light-colored stools; loss of appetite; nausea; right upper belly pain; unusually weak or tired; yellowing of the eyes or skin Side effects that usually do not require medical attention (report to your doctor or health care professional if they continue or are bothersome):  constipation  decreased appetite  diarrhea  headache  mouth sores  nausea, vomiting  tiredness This list may not describe all possible side effects. Call your doctor for medical advice about side effects. You may report side effects to FDA at 1-800-FDA-1088. Where should I keep my medicine? This drug is given in a hospital or clinic and will not be stored at home. NOTE: This sheet is a summary. It may not cover all  possible information. If you have questions about this medicine, talk to your doctor, pharmacist, or health care provider.  2020 Elsevier/Gold Standard (2018-05-01 10:26:46) Rituximab injection What is this medicine? RITUXIMAB (ri TUX i mab) is a monoclonal antibody. It is used to treat certain types of cancer like non-Hodgkin lymphoma and chronic lymphocytic leukemia. It is also used to treat rheumatoid arthritis, granulomatosis with polyangiitis (or Wegener's granulomatosis), microscopic polyangiitis, and pemphigus vulgaris. This medicine may be used for other purposes; ask your health care provider or pharmacist if you have questions. COMMON BRAND NAME(S): Rituxan, RUXIENCE What should I tell my health care provider before I take this medicine? They need to know if you have any of these conditions:  heart disease  infection (especially a virus infection such as hepatitis B, chickenpox, cold sores, or herpes)  immune system problems  irregular heartbeat  kidney disease  low blood counts, like low white cell, platelet, or red cell counts  lung or breathing disease, like asthma  recently received or scheduled to receive a vaccine  an unusual or allergic reaction to rituximab, other medicines, foods, dyes, or preservatives  pregnant or trying to get pregnant  breast-feeding How should I use this medicine? This medicine is  for infusion into a vein. It is administered in a hospital or clinic by a specially trained health care professional. A special MedGuide will be given to you by the pharmacist with each prescription and refill. Be sure to read this information carefully each time. Talk to your pediatrician regarding the use of this medicine in children. This medicine is not approved for use in children. Overdosage: If you think you have taken too much of this medicine contact a poison control center or emergency room at once. NOTE: This medicine is only for you. Do not share this  medicine with others. What if I miss a dose? It is important not to miss a dose. Call your doctor or health care professional if you are unable to keep an appointment. What may interact with this medicine?  cisplatin  live virus vaccines This list may not describe all possible interactions. Give your health care provider a list of all the medicines, herbs, non-prescription drugs, or dietary supplements you use. Also tell them if you smoke, drink alcohol, or use illegal drugs. Some items may interact with your medicine. What should I watch for while using this medicine? Your condition will be monitored carefully while you are receiving this medicine. You may need blood work done while you are taking this medicine. This medicine can cause serious allergic reactions. To reduce your risk you may need to take medicine before treatment with this medicine. Take your medicine as directed. In some patients, this medicine may cause a serious brain infection that may cause death. If you have any problems seeing, thinking, speaking, walking, or standing, tell your healthcare professional right away. If you cannot reach your healthcare professional, urgently seek other source of medical care. Call your doctor or health care professional for advice if you get a fever, chills or sore throat, or other symptoms of a cold or flu. Do not treat yourself. This drug decreases your body's ability to fight infections. Try to avoid being around people who are sick. Do not become pregnant while taking this medicine or for at least 12 months after stopping it. Women should inform their doctor if they wish to become pregnant or think they might be pregnant. There is a potential for serious side effects to an unborn child. Talk to your health care professional or pharmacist for more information. Do not breast-feed an infant while taking this medicine or for at least 6 months after stopping it. What side effects may I notice from  receiving this medicine? Side effects that you should report to your doctor or health care professional as soon as possible:  allergic reactions like skin rash, itching or hives; swelling of the face, lips, or tongue  breathing problems  chest pain  changes in vision  diarrhea  headache with fever, neck stiffness, sensitivity to light, nausea, or confusion  fast, irregular heartbeat  loss of memory  low blood counts - this medicine may decrease the number of white blood cells, red blood cells and platelets. You may be at increased risk for infections and bleeding.  mouth sores  problems with balance, talking, or walking  redness, blistering, peeling or loosening of the skin, including inside the mouth  signs of infection - fever or chills, cough, sore throat, pain or difficulty passing urine  signs and symptoms of kidney injury like trouble passing urine or change in the amount of urine  signs and symptoms of liver injury like dark yellow or brown urine; general ill feeling or flu-like  symptoms; light-colored stools; loss of appetite; nausea; right upper belly pain; unusually weak or tired; yellowing of the eyes or skin  signs and symptoms of low blood pressure like dizziness; feeling faint or lightheaded, falls; unusually weak or tired  stomach pain  swelling of the ankles, feet, hands  unusual bleeding or bruising  vomiting Side effects that usually do not require medical attention (report to your doctor or health care professional if they continue or are bothersome):  headache  joint pain  muscle cramps or muscle pain  nausea  tiredness This list may not describe all possible side effects. Call your doctor for medical advice about side effects. You may report side effects to FDA at 1-800-FDA-1088. Where should I keep my medicine? This drug is given in a hospital or clinic and will not be stored at home. NOTE: This sheet is a summary. It may not cover all  possible information. If you have questions about this medicine, talk to your doctor, pharmacist, or health care provider.  2020 Elsevier/Gold Standard (2018-03-21 22:01:36)

## 2019-10-18 NOTE — Progress Notes (Signed)
Hematology and Oncology Follow Up Visit  Kristine Sullivan [id number] 05-26-1931 86 y.o. 10/18/2019   Principle Diagnosis:  Marginal zone lymphoma -- recurrent Iron deficiency anemia Autoimmune hemolytic anemia -- warm antibody  Past therapy: CVP -- completed 8 cycles on 04/09/2012 Maintenance Rituxan - 2 years completed in June 2016 Rituxan/Cytoxan -- startedon 11/08/2018, s/p cycle #2, Cytoxan d/c'd 12/19/2018  Current Therapy: Rituxan/Bendamustine - startedon 04/17/2019, s/p cycle #5 --will omit day #2 of bendamustine for the last 2 cycles  IV Iron as needed   Interim History:  Kristine Sullivan is here today for follow-up. We held her treatment 1 week. This really has helped. Her white cell count is 3.3 today.  She feels well. She has had no problems with nausea or vomiting. She has had no bleeding. There is no fatigue or weakness. She has had no problems with leg swelling. There is been no rashes. She has had no issues with bowels or bladder. She has had no headache.  She is staying inside because of the hot and humid weather.  Overall, her performance status is ECOG 1.  Medications:  Allergies as of 10/18/2019      Reactions   Lisinopril Swelling   Facial swelling    Pregabalin Other (See Comments)   constipation   Timolol Swelling, Other (See Comments)      Medication List       Accurate as of October 18, 2019  9:37 AM. If you have any questions, ask your nurse or doctor.        acyclovir 400 MG tablet Commonly known as: ZOVIRAX TAKE 1 TABLET BY MOUTH EVERY DAY   allopurinol 300 MG tablet Commonly known as: ZYLOPRIM TAKE 1 TABLET BY MOUTH EVERY DAY   apixaban 5 MG Tabs tablet Commonly known as: ELIQUIS Take 5 mg by mouth 2 (two) times daily.   aspirin 81 MG tablet Take 81 mg by mouth daily.   atorvastatin 80 MG tablet Commonly known as: LIPITOR Take 40 mg by mouth daily.   Calcium 600-D 600-400 MG-UNIT Tabs Generic drug: Calcium  Carbonate-Vitamin D3 Take by mouth.   dexamethasone 4 MG tablet Commonly known as: DECADRON Take 2 tablets (8 mg total) by mouth daily. Start the day after bendamustine chemotherapy for 2 days. Take with food.   diltiazem 90 MG 12 hr capsule Commonly known as: CARDIZEM SR Take 1 capsule (90 mg total) by mouth 2 (two) times daily.   diphenhydrAMINE 25 MG tablet Commonly known as: SOMINEX Take 25 mg by mouth daily.   dorzolamide 2 % ophthalmic solution Commonly known as: TRUSOPT Place 1 drop into both eyes 3 (three) times daily.   furosemide 40 MG tablet Commonly known as: LASIX Take 40 mg by mouth daily.   ipratropium-albuterol 0.5-2.5 (3) MG/3ML Soln Commonly known as: DUONEB INHALE 3 ML BY NEBULIZATION EVERY SIX (6) HOURS AS NEEDED.   latanoprost 0.005 % ophthalmic solution Commonly known as: XALATAN Place 1 drop into both eyes at bedtime.   lidocaine-prilocaine cream Commonly known as: EMLA Apply to affected area once   loratadine 10 MG tablet Commonly known as: CLARITIN OTC Take by mouth one a day for 10 days, then once a day as needed -allergies.   LORazepam 0.5 MG tablet Commonly known as: Ativan Take 1 tablet (0.5 mg total) by mouth every 6 (six) hours as needed (Nausea or vomiting).   ondansetron 8 MG tablet Commonly known as: Zofran Take 1 tablet (8 mg total) by mouth 2 (two) times daily  as needed for refractory nausea / vomiting. Start on day 2 after bendamustine chemo.   potassium chloride 10 MEQ tablet Commonly known as: KLOR-CON Take 10 mEq by mouth daily.   prochlorperazine 10 MG tablet Commonly known as: COMPAZINE Take 1 tablet (10 mg total) by mouth every 6 (six) hours as needed (Nausea or vomiting).   spironolactone 25 MG tablet Commonly known as: ALDACTONE Take by mouth.   Vitamin D (Ergocalciferol) 1.25 MG (50000 UNIT) Caps capsule Commonly known as: DRISDOL Take 1 capsule (50,000 Units total) by mouth once a week.       Allergies:    Allergies  Allergen Reactions  . Lisinopril Swelling    Facial swelling     . Pregabalin Other (See Comments)    constipation  . Timolol Swelling and Other (See Comments)    Past Medical History, Surgical history, Social history, and Family History were reviewed and updated.  Review of Systems: Review of Systems  Constitutional: Negative.   HENT: Negative.   Eyes: Negative.   Respiratory: Negative.   Cardiovascular: Negative.   Gastrointestinal: Negative.   Genitourinary: Negative.   Musculoskeletal: Negative.   Skin: Negative.   Neurological: Negative.   Endo/Heme/Allergies: Negative.   Psychiatric/Behavioral: Negative.      Physical Exam:  weight is 148 lb (67.1 kg). Her oral temperature is 97.8 F (36.6 C). Her blood pressure is 129/80 and her pulse is 85. Her respiration is 17 and oxygen saturation is 93%.   Wt Readings from Last 3 Encounters:  10/18/19 148 lb (67.1 kg)  10/10/19 143 lb (64.9 kg)  09/12/19 144 lb (65.3 kg)    Physical Exam Vitals reviewed.  HENT:     Head: Normocephalic and atraumatic.  Eyes:     Pupils: Pupils are equal, round, and reactive to light.  Cardiovascular:     Rate and Rhythm: Normal rate and regular rhythm.     Heart sounds: Normal heart sounds.  Pulmonary:     Effort: Pulmonary effort is normal.     Breath sounds: Normal breath sounds.  Abdominal:     General: Bowel sounds are normal.     Palpations: Abdomen is soft.  Musculoskeletal:        General: No tenderness or deformity. Normal range of motion.     Cervical back: Normal range of motion.  Lymphadenopathy:     Cervical: No cervical adenopathy.  Skin:    General: Skin is warm and dry.     Findings: No erythema or rash.  Neurological:     Mental Status: She is alert and oriented to person, place, and time.  Psychiatric:        Behavior: Behavior normal.        Thought Content: Thought content normal.        Judgment: Judgment normal.      Lab Results   Component Value Date   WBC 3.3 (L) 10/18/2019   HGB 12.2 10/18/2019   HCT 37.8 10/18/2019   MCV 89.8 10/18/2019   PLT 114 (L) 10/18/2019   Lab Results  Component Value Date   FERRITIN 4,932 (H) 10/10/2019   IRON 97 10/10/2019   TIBC 225 (L) 10/10/2019   UIBC 129 10/10/2019   IRONPCTSAT 43 10/10/2019   Lab Results  Component Value Date   RETICCTPCT 2.4 10/18/2019   RBC 4.22 10/18/2019   RBC 4.21 10/18/2019   RETICCTABS 59.2 11/13/2013   No results found for: KPAFRELGTCHN, LAMBDASER, KAPLAMBRATIO Lab Results  Component Value Date  IGGSERUM 557 (L) 06/19/2013   IGA 100 06/19/2013   IGMSERUM 95 06/19/2013   Lab Results  Component Value Date   TOTALPROTELP 6.3 09/16/2011   ALBUMINELP 59.9 09/16/2011   A1GS 7.3 (H) 09/16/2011   A2GS 9.9 09/16/2011   BETS 8.4 (H) 09/16/2011   BETA2SER 4.8 09/16/2011   GAMS 9.7 (L) 09/16/2011   MSPIKE NOT DET 09/16/2011   SPEI * 09/16/2011     Chemistry      Component Value Date/Time   NA 140 10/10/2019 0812   NA 140 09/16/2016 0945   K 3.6 10/10/2019 0812   K 3.2 (L) 09/16/2016 0945   CL 105 10/10/2019 0812   CL 101 12/08/2014 1138   CL 99 05/28/2012 1036   CO2 26 10/10/2019 0812   CO2 26 09/16/2016 0945   BUN 11 10/10/2019 0812   BUN 9.0 09/16/2016 0945   CREATININE 0.67 10/10/2019 0812   CREATININE 0.7 09/16/2016 0945      Component Value Date/Time   CALCIUM 9.2 10/10/2019 0812   CALCIUM 9.0 09/16/2016 0945   ALKPHOS 104 10/10/2019 0812   ALKPHOS 122 09/16/2016 0945   AST 22 10/10/2019 0812   AST 25 09/16/2016 0945   ALT 20 10/10/2019 0812   ALT 15 09/16/2016 0945   BILITOT 1.2 10/10/2019 0812   BILITOT 0.90 09/16/2016 0945       Impression and Plan: Kristine Sullivan is a very pleasant 86 yo African American female with a history of marginal zone lymphoma, angioedema and autoimmune hemolytic anemia.  Again, the Rituxan/ bendamustine combination is working quite well.  We will go ahead with cycle #6 today. I may  give her an extra week off from treatment which seemed to help her blood counts. I think that with her blood counts doing well, we have the luxury of being able to give her an extra week off.  I know that she will have a nice and quiet Labor Day weekend.   [name], MD 8/27/20219:37 AM

## 2019-10-21 ENCOUNTER — Ambulatory Visit: Payer: Medicare Other

## 2019-10-21 ENCOUNTER — Other Ambulatory Visit: Payer: Medicare Other

## 2019-11-14 ENCOUNTER — Ambulatory Visit: Payer: Medicare Other | Admitting: Hematology & Oncology

## 2019-11-14 ENCOUNTER — Ambulatory Visit: Payer: Medicare Other

## 2019-11-14 ENCOUNTER — Other Ambulatory Visit: Payer: Medicare Other

## 2019-11-22 ENCOUNTER — Inpatient Hospital Stay: Payer: Medicare Other

## 2019-11-22 ENCOUNTER — Other Ambulatory Visit: Payer: Self-pay

## 2019-11-22 ENCOUNTER — Other Ambulatory Visit (HOSPITAL_BASED_OUTPATIENT_CLINIC_OR_DEPARTMENT_OTHER): Payer: Self-pay | Admitting: Internal Medicine

## 2019-11-22 ENCOUNTER — Ambulatory Visit: Payer: Medicare Other | Attending: Internal Medicine

## 2019-11-22 ENCOUNTER — Encounter: Payer: Self-pay | Admitting: Hematology & Oncology

## 2019-11-22 ENCOUNTER — Inpatient Hospital Stay: Payer: Medicare Other | Attending: Hematology & Oncology | Admitting: Hematology & Oncology

## 2019-11-22 VITALS — BP 150/66 | HR 86 | Temp 98.7°F | Resp 16 | Wt 144.0 lb

## 2019-11-22 VITALS — BP 117/48 | HR 73 | Temp 98.6°F | Resp 17

## 2019-11-22 DIAGNOSIS — G629 Polyneuropathy, unspecified: Secondary | ICD-10-CM | POA: Insufficient documentation

## 2019-11-22 DIAGNOSIS — D5911 Warm autoimmune hemolytic anemia: Secondary | ICD-10-CM

## 2019-11-22 DIAGNOSIS — Z5112 Encounter for antineoplastic immunotherapy: Secondary | ICD-10-CM | POA: Insufficient documentation

## 2019-11-22 DIAGNOSIS — C8307 Small cell B-cell lymphoma, spleen: Secondary | ICD-10-CM

## 2019-11-22 DIAGNOSIS — Z23 Encounter for immunization: Secondary | ICD-10-CM

## 2019-11-22 DIAGNOSIS — D509 Iron deficiency anemia, unspecified: Secondary | ICD-10-CM | POA: Insufficient documentation

## 2019-11-22 DIAGNOSIS — C8300 Small cell B-cell lymphoma, unspecified site: Secondary | ICD-10-CM | POA: Diagnosis present

## 2019-11-22 LAB — CBC WITH DIFFERENTIAL (CANCER CENTER ONLY)
Abs Immature Granulocytes: 0.04 10*3/uL (ref 0.00–0.07)
Basophils Absolute: 0 10*3/uL (ref 0.0–0.1)
Basophils Relative: 0 %
Eosinophils Absolute: 0 10*3/uL (ref 0.0–0.5)
Eosinophils Relative: 1 %
HCT: 39.8 % (ref 36.0–46.0)
Hemoglobin: 12.8 g/dL (ref 12.0–15.0)
Immature Granulocytes: 1 %
Lymphocytes Relative: 11 %
Lymphs Abs: 0.3 10*3/uL — ABNORMAL LOW (ref 0.7–4.0)
MCH: 29.1 pg (ref 26.0–34.0)
MCHC: 32.2 g/dL (ref 30.0–36.0)
MCV: 90.5 fL (ref 80.0–100.0)
Monocytes Absolute: 0.7 10*3/uL (ref 0.1–1.0)
Monocytes Relative: 20 %
Neutro Abs: 2.2 10*3/uL (ref 1.7–7.7)
Neutrophils Relative %: 67 %
Platelet Count: 89 10*3/uL — ABNORMAL LOW (ref 150–400)
RBC: 4.4 MIL/uL (ref 3.87–5.11)
RDW: 15 % (ref 11.5–15.5)
WBC Count: 3.2 10*3/uL — ABNORMAL LOW (ref 4.0–10.5)
nRBC: 0 % (ref 0.0–0.2)

## 2019-11-22 LAB — RETICULOCYTES
Immature Retic Fract: 15.9 % (ref 2.3–15.9)
RBC.: 4.35 MIL/uL (ref 3.87–5.11)
Retic Count, Absolute: 142.2 10*3/uL (ref 19.0–186.0)
Retic Ct Pct: 3.3 % — ABNORMAL HIGH (ref 0.4–3.1)

## 2019-11-22 LAB — CMP (CANCER CENTER ONLY)
ALT: 27 U/L (ref 0–44)
AST: 24 U/L (ref 15–41)
Albumin: 3.9 g/dL (ref 3.5–5.0)
Alkaline Phosphatase: 129 U/L — ABNORMAL HIGH (ref 38–126)
Anion gap: 6 (ref 5–15)
BUN: 11 mg/dL (ref 8–23)
CO2: 27 mmol/L (ref 22–32)
Calcium: 9.2 mg/dL (ref 8.9–10.3)
Chloride: 105 mmol/L (ref 98–111)
Creatinine: 0.7 mg/dL (ref 0.44–1.00)
GFR, Est AFR Am: >60 mL/min (ref >60)
GFR, Estimated: >60 mL/min (ref >60)
Glucose, Bld: 109 mg/dL — ABNORMAL HIGH (ref 70–99)
Potassium: 4.4 mmol/L (ref 3.5–5.1)
Sodium: 138 mmol/L (ref 135–145)
Total Bilirubin: 0.9 mg/dL (ref 0.3–1.2)
Total Protein: 6 g/dL — ABNORMAL LOW (ref 6.5–8.1)

## 2019-11-22 LAB — IRON AND TIBC
Iron: 102 ug/dL (ref 41–142)
Saturation Ratios: 35 % (ref 21–57)
TIBC: 295 ug/dL (ref 236–444)
UIBC: 193 ug/dL (ref 120–384)

## 2019-11-22 LAB — FERRITIN: Ferritin: 3569 ng/mL — ABNORMAL HIGH (ref 11–307)

## 2019-11-22 LAB — LACTATE DEHYDROGENASE: LDH: 340 U/L — ABNORMAL HIGH (ref 98–192)

## 2019-11-22 MED ORDER — HEPARIN SOD (PORK) LOCK FLUSH 100 UNIT/ML IV SOLN
500.0000 [IU] | Freq: Once | INTRAVENOUS | Status: AC | PRN
  Administered 2019-11-22: 500 [IU]
  Filled 2019-11-22: qty 5

## 2019-11-22 MED ORDER — SODIUM CHLORIDE 0.9 % IV SOLN
Freq: Once | INTRAVENOUS | Status: AC
  Filled 2019-11-22: qty 250

## 2019-11-22 MED ORDER — PALONOSETRON HCL INJECTION 0.25 MG/5ML
INTRAVENOUS | Status: AC
  Filled 2019-11-22: qty 5

## 2019-11-22 MED ORDER — SODIUM CHLORIDE 0.9% FLUSH
10.0000 mL | INTRAVENOUS | Status: DC | PRN
  Administered 2019-11-22: 10 mL
  Filled 2019-11-22: qty 10

## 2019-11-22 MED ORDER — DIPHENHYDRAMINE HCL 25 MG PO CAPS
ORAL_CAPSULE | ORAL | Status: AC
  Filled 2019-11-22: qty 2

## 2019-11-22 MED ORDER — SODIUM CHLORIDE 0.9 % IV SOLN
600.0000 mg | Freq: Once | INTRAVENOUS | Status: AC
  Administered 2019-11-22: 600 mg via INTRAVENOUS
  Filled 2019-11-22: qty 50

## 2019-11-22 MED ORDER — ACETAMINOPHEN 325 MG PO TABS
ORAL_TABLET | ORAL | Status: AC
  Filled 2019-11-22: qty 2

## 2019-11-22 MED ORDER — SODIUM CHLORIDE 0.9 % IV SOLN
10.0000 mg | Freq: Once | INTRAVENOUS | Status: AC
  Administered 2019-11-22: 10 mg via INTRAVENOUS
  Filled 2019-11-22: qty 10

## 2019-11-22 MED ORDER — PALONOSETRON HCL INJECTION 0.25 MG/5ML
0.2500 mg | Freq: Once | INTRAVENOUS | Status: AC
  Administered 2019-11-22: 0.25 mg via INTRAVENOUS

## 2019-11-22 MED ORDER — DIPHENHYDRAMINE HCL 25 MG PO CAPS
50.0000 mg | ORAL_CAPSULE | Freq: Once | ORAL | Status: AC
  Administered 2019-11-22: 50 mg via ORAL

## 2019-11-22 MED ORDER — SODIUM CHLORIDE 0.9 % IV SOLN
72.0000 mg/m2 | Freq: Once | INTRAVENOUS | Status: AC
  Administered 2019-11-22: 125 mg via INTRAVENOUS
  Filled 2019-11-22: qty 5

## 2019-11-22 MED ORDER — ACETAMINOPHEN 325 MG PO TABS
650.0000 mg | ORAL_TABLET | Freq: Once | ORAL | Status: AC
  Administered 2019-11-22: 650 mg via ORAL

## 2019-11-22 NOTE — Progress Notes (Signed)
Hematology and Oncology Follow Up Visit  Kristine Sullivan [id number] [birth year] 86 y.o. 11/22/2019   Principle Diagnosis:  Marginal zone lymphoma -- recurrent Iron deficiency anemia Autoimmune hemolytic anemia -- warm antibody  Past therapy: CVP -- completed 8 cycles on 04/09/2012 Maintenance Rituxan - 2 years completed in June 2016 Rituxan/Cytoxan -- startedon 11/08/2018, s/p cycle #2, Cytoxan d/c'd 12/19/2018  Current Therapy: Rituxan/Bendamustine - startedon 04/17/2019, s/p cycle #5 --will omit day #2 of bendamustine for the last 2 cycles  IV Iron as needed   Interim History:  Kristine Sullivan is here today for follow-up.  She is doing pretty well.  She really has had no specific complaints.  I am just happy that she has done so well with the bendamustine.  We will give her 1 day of bendamustine.  She is getting ready for a granddaughter's wedding.  The date will be October 23-this is my wedding anniversary.  She has had some weakness in her legs.  She has little bit neuropathy in her fingers.  Her appetite has been good.  She has had no problems with bowels or bladder.  She has had no fever.  There is no cough or shortness of breath.  She has had no nausea or vomiting.  Overall, her performance status is ECOG 1.    Medications:  Allergies as of 11/22/2019      Reactions   Lisinopril Swelling   Facial swelling    Pregabalin Other (See Comments)   constipation   Timolol Swelling, Other (See Comments)      Medication List       Accurate as of November 22, 2019  8:59 AM. If you have any questions, ask your nurse or doctor.        acyclovir 400 MG tablet Commonly known as: ZOVIRAX TAKE 1 TABLET BY MOUTH EVERY DAY   allopurinol 300 MG tablet Commonly known as: ZYLOPRIM TAKE 1 TABLET BY MOUTH EVERY DAY   apixaban 5 MG Tabs tablet Commonly known as: ELIQUIS Take 5 mg by mouth 2 (two) times daily.   aspirin 81 MG tablet Take 81 mg by mouth daily.     atorvastatin 80 MG tablet Commonly known as: LIPITOR Take 40 mg by mouth daily.   Calcium 600-D 600-400 MG-UNIT Tabs Generic drug: Calcium Carbonate-Vitamin D3 Take by mouth.   dexamethasone 4 MG tablet Commonly known as: DECADRON Take 2 tablets (8 mg total) by mouth daily. Start the day after bendamustine chemotherapy for 2 days. Take with food.   diltiazem 90 MG 12 hr capsule Commonly known as: CARDIZEM SR Take 1 capsule (90 mg total) by mouth 2 (two) times daily.   diphenhydrAMINE 25 MG tablet Commonly known as: SOMINEX Take 25 mg by mouth daily.   dorzolamide 2 % ophthalmic solution Commonly known as: TRUSOPT Place 1 drop into both eyes 3 (three) times daily.   furosemide 40 MG tablet Commonly known as: LASIX Take 40 mg by mouth daily.   ipratropium-albuterol 0.5-2.5 (3) MG/3ML Soln Commonly known as: DUONEB INHALE 3 ML BY NEBULIZATION EVERY SIX (6) HOURS AS NEEDED.   latanoprost 0.005 % ophthalmic solution Commonly known as: XALATAN Place 1 drop into both eyes at bedtime.   lidocaine-prilocaine cream Commonly known as: EMLA Apply to affected area once   loratadine 10 MG tablet Commonly known as: CLARITIN OTC Take by mouth one a day for 10 days, then once a day as needed -allergies.   LORazepam 0.5 MG tablet Commonly known as: Ativan Take 1  tablet (0.5 mg total) by mouth every 6 (six) hours as needed (Nausea or vomiting).   ondansetron 8 MG tablet Commonly known as: Zofran Take 1 tablet (8 mg total) by mouth 2 (two) times daily as needed for refractory nausea / vomiting. Start on day 2 after bendamustine chemo.   potassium chloride 10 MEQ tablet Commonly known as: KLOR-CON Take 10 mEq by mouth daily.   prochlorperazine 10 MG tablet Commonly known as: COMPAZINE Take 1 tablet (10 mg total) by mouth every 6 (six) hours as needed (Nausea or vomiting).   spironolactone 25 MG tablet Commonly known as: ALDACTONE Take by mouth.   Vitamin D  (Ergocalciferol) 1.25 MG (50000 UNIT) Caps capsule Commonly known as: DRISDOL Take 1 capsule (50,000 Units total) by mouth once a week.       Allergies:  Allergies  Allergen Reactions  . Lisinopril Swelling    Facial swelling     . Pregabalin Other (See Comments)    constipation  . Timolol Swelling and Other (See Comments)    Past Medical History, Surgical history, Social history, and Family History were reviewed and updated.  Review of Systems: Review of Systems  Constitutional: Negative.   HENT: Negative.   Eyes: Negative.   Respiratory: Negative.   Cardiovascular: Negative.   Gastrointestinal: Negative.   Genitourinary: Negative.   Musculoskeletal: Negative.   Skin: Negative.   Neurological: Negative.   Endo/Heme/Allergies: Negative.   Psychiatric/Behavioral: Negative.      Physical Exam:  vitals were not taken for this visit.   Wt Readings from Last 3 Encounters:  10/18/19 148 lb (67.1 kg)  10/10/19 143 lb (64.9 kg)  09/12/19 144 lb (65.3 kg)    Physical Exam Vitals reviewed.  HENT:     Head: Normocephalic and atraumatic.  Eyes:     Pupils: Pupils are equal, round, and reactive to light.  Cardiovascular:     Rate and Rhythm: Normal rate and regular rhythm.     Heart sounds: Normal heart sounds.  Pulmonary:     Effort: Pulmonary effort is normal.     Breath sounds: Normal breath sounds.  Abdominal:     General: Bowel sounds are normal.     Palpations: Abdomen is soft.  Musculoskeletal:        General: No tenderness or deformity. Normal range of motion.     Cervical back: Normal range of motion.  Lymphadenopathy:     Cervical: No cervical adenopathy.  Skin:    General: Skin is warm and dry.     Findings: No erythema or rash.  Neurological:     Mental Status: She is alert and oriented to person, place, and time.  Psychiatric:        Behavior: Behavior normal.        Thought Content: Thought content normal.        Judgment: Judgment normal.       Lab Results  Component Value Date   WBC 3.2 (L) 11/22/2019   HGB 12.8 11/22/2019   HCT 39.8 11/22/2019   MCV 90.5 11/22/2019   PLT 89 (L) 11/22/2019   Lab Results  Component Value Date   FERRITIN 2,956 (H) 10/18/2019   IRON 125 10/18/2019   TIBC 255 10/18/2019   UIBC 130 10/18/2019   IRONPCTSAT 49 10/18/2019   Lab Results  Component Value Date   RETICCTPCT 2.4 10/18/2019   RBC 4.40 11/22/2019   RETICCTABS 59.2 11/13/2013   No results found for: KPAFRELGTCHN, LAMBDASER, KAPLAMBRATIO Lab Results  Component Value Date   IOEVOJJK 093 (L) 06/19/2013   IGA 100 06/19/2013   IGMSERUM 95 06/19/2013   Lab Results  Component Value Date   TOTALPROTELP 6.3 09/16/2011   ALBUMINELP 59.9 09/16/2011   A1GS 7.3 (H) 09/16/2011   A2GS 9.9 09/16/2011   BETS 8.4 (H) 09/16/2011   BETA2SER 4.8 09/16/2011   GAMS 9.7 (L) 09/16/2011   MSPIKE NOT DET 09/16/2011   SPEI * 09/16/2011     Chemistry      Component Value Date/Time   NA 142 10/18/2019 0910   NA 140 09/16/2016 0945   K 3.7 10/18/2019 0910   K 3.2 (L) 09/16/2016 0945   CL 106 10/18/2019 0910   CL 101 12/08/2014 1138   CL 99 05/28/2012 1036   CO2 29 10/18/2019 0910   CO2 26 09/16/2016 0945   BUN 7 (L) 10/18/2019 0910   BUN 9.0 09/16/2016 0945   CREATININE 0.69 10/18/2019 0910   CREATININE 0.7 09/16/2016 0945      Component Value Date/Time   CALCIUM 8.9 10/18/2019 0910   CALCIUM 9.0 09/16/2016 0945   ALKPHOS 105 10/18/2019 0910   ALKPHOS 122 09/16/2016 0945   AST 19 10/18/2019 0910   AST 25 09/16/2016 0945   ALT 15 10/18/2019 0910   ALT 15 09/16/2016 0945   BILITOT 0.7 10/18/2019 0910   BILITOT 0.90 09/16/2016 0945      Impression and Plan: Ms. Musto is a very pleasant 86 yo African American female with a history of marginal zone lymphoma, angioedema and autoimmune hemolytic anemia.  Again, the Rituxan/ bendamustine combination is working quite well.  We will go ahead with cycle #7 today. I may give  her an extra week off from treatment which seemed to help her blood counts. I think that with her blood counts doing well, we have the luxury of being able to give her an extra week off.   [name], MD 10/1/20218:59 AM

## 2019-11-22 NOTE — Patient Instructions (Signed)

## 2019-11-22 NOTE — Patient Instructions (Addendum)
[facility] Discharge Instructions for Patients Receiving Chemotherapy  Today you received the following chemotherapy agents:  Rituxan & Bendeka  To help prevent nausea and vomiting after your treatment, we encourage you to take your nausea medication as prescribed.   If you develop nausea and vomiting that is not controlled by your nausea medication, call the clinic.   BELOW ARE SYMPTOMS THAT SHOULD BE REPORTED IMMEDIATELY:  *FEVER GREATER THAN 100.5 F  *CHILLS WITH OR WITHOUT FEVER  NAUSEA AND VOMITING THAT IS NOT CONTROLLED WITH YOUR NAUSEA MEDICATION  *UNUSUAL SHORTNESS OF BREATH  *UNUSUAL BRUISING OR BLEEDING  TENDERNESS IN MOUTH AND THROAT WITH OR WITHOUT PRESENCE OF ULCERS  *URINARY PROBLEMS  *BOWEL PROBLEMS  UNUSUAL RASH Items with * indicate a potential emergency and should be followed up as soon as possible.  Feel free to call the clinic should you have any questions or concerns. The clinic phone number is [phone].  Please show the CHEMO ALERT CARD at check-in to the Emergency Department and triage nurse.   

## 2019-11-22 NOTE — Progress Notes (Signed)
Reviewed pt labs with Dr. Marin Olp and pt ok to treat with platelets 89

## 2019-12-12 ENCOUNTER — Other Ambulatory Visit: Payer: Medicare Other

## 2019-12-12 ENCOUNTER — Ambulatory Visit: Payer: Medicare Other | Admitting: Family

## 2019-12-12 ENCOUNTER — Ambulatory Visit: Payer: Medicare Other

## 2019-12-27 ENCOUNTER — Inpatient Hospital Stay: Payer: Medicare Other | Attending: Hematology & Oncology | Admitting: Hematology & Oncology

## 2019-12-27 ENCOUNTER — Inpatient Hospital Stay: Payer: Medicare Other

## 2019-12-27 ENCOUNTER — Encounter: Payer: Self-pay | Admitting: Hematology & Oncology

## 2019-12-27 ENCOUNTER — Other Ambulatory Visit: Payer: Self-pay

## 2019-12-27 ENCOUNTER — Ambulatory Visit: Payer: Medicare Other

## 2019-12-27 VITALS — BP 129/82 | HR 53 | Temp 97.7°F | Resp 18 | Wt 142.0 lb

## 2019-12-27 DIAGNOSIS — Z95828 Presence of other vascular implants and grafts: Secondary | ICD-10-CM

## 2019-12-27 DIAGNOSIS — Z5112 Encounter for antineoplastic immunotherapy: Secondary | ICD-10-CM | POA: Insufficient documentation

## 2019-12-27 DIAGNOSIS — C8307 Small cell B-cell lymphoma, spleen: Secondary | ICD-10-CM

## 2019-12-27 DIAGNOSIS — C8300 Small cell B-cell lymphoma, unspecified site: Secondary | ICD-10-CM | POA: Insufficient documentation

## 2019-12-27 DIAGNOSIS — G629 Polyneuropathy, unspecified: Secondary | ICD-10-CM | POA: Insufficient documentation

## 2019-12-27 DIAGNOSIS — D5911 Warm autoimmune hemolytic anemia: Secondary | ICD-10-CM | POA: Diagnosis not present

## 2019-12-27 DIAGNOSIS — D509 Iron deficiency anemia, unspecified: Secondary | ICD-10-CM | POA: Diagnosis not present

## 2019-12-27 LAB — IRON AND TIBC
Iron: 75 ug/dL (ref 41–142)
Saturation Ratios: 31 % (ref 21–57)
TIBC: 244 ug/dL (ref 236–444)
UIBC: 169 ug/dL (ref 120–384)

## 2019-12-27 LAB — RETICULOCYTES
Immature Retic Fract: 10.2 % (ref 2.3–15.9)
RBC.: 4.55 MIL/uL (ref 3.87–5.11)
Retic Count, Absolute: 123.8 10*3/uL (ref 19.0–186.0)
Retic Ct Pct: 2.7 % (ref 0.4–3.1)

## 2019-12-27 LAB — CBC WITH DIFFERENTIAL (CANCER CENTER ONLY)
Abs Immature Granulocytes: 0.02 10*3/uL (ref 0.00–0.07)
Basophils Absolute: 0 10*3/uL (ref 0.0–0.1)
Basophils Relative: 0 %
Eosinophils Absolute: 0 10*3/uL (ref 0.0–0.5)
Eosinophils Relative: 1 %
HCT: 41.1 % (ref 36.0–46.0)
Hemoglobin: 13.5 g/dL (ref 12.0–15.0)
Immature Granulocytes: 1 %
Lymphocytes Relative: 13 %
Lymphs Abs: 0.5 10*3/uL — ABNORMAL LOW (ref 0.7–4.0)
MCH: 29.6 pg (ref 26.0–34.0)
MCHC: 32.8 g/dL (ref 30.0–36.0)
MCV: 90.1 fL (ref 80.0–100.0)
Monocytes Absolute: 0.8 10*3/uL (ref 0.1–1.0)
Monocytes Relative: 23 %
Neutro Abs: 2.3 10*3/uL (ref 1.7–7.7)
Neutrophils Relative %: 62 %
Platelet Count: 113 10*3/uL — ABNORMAL LOW (ref 150–400)
RBC: 4.56 MIL/uL (ref 3.87–5.11)
RDW: 14.5 % (ref 11.5–15.5)
WBC Count: 3.6 10*3/uL — ABNORMAL LOW (ref 4.0–10.5)
nRBC: 0 % (ref 0.0–0.2)

## 2019-12-27 LAB — CMP (CANCER CENTER ONLY)
ALT: 20 U/L (ref 0–44)
AST: 21 U/L (ref 15–41)
Albumin: 3.6 g/dL (ref 3.5–5.0)
Alkaline Phosphatase: 113 U/L (ref 38–126)
Anion gap: 9 (ref 5–15)
BUN: 10 mg/dL (ref 8–23)
CO2: 29 mmol/L (ref 22–32)
Calcium: 8.9 mg/dL (ref 8.9–10.3)
Chloride: 103 mmol/L (ref 98–111)
Creatinine: 0.81 mg/dL (ref 0.44–1.00)
GFR, Estimated: >60 mL/min (ref >60)
Glucose, Bld: 152 mg/dL — ABNORMAL HIGH (ref 70–99)
Potassium: 3 mmol/L — ABNORMAL LOW (ref 3.5–5.1)
Sodium: 141 mmol/L (ref 135–145)
Total Bilirubin: 0.8 mg/dL (ref 0.3–1.2)
Total Protein: 5.7 g/dL — ABNORMAL LOW (ref 6.5–8.1)

## 2019-12-27 LAB — FERRITIN: Ferritin: 3702 ng/mL — ABNORMAL HIGH (ref 11–307)

## 2019-12-27 MED ORDER — SODIUM CHLORIDE 0.9% FLUSH
10.0000 mL | INTRAVENOUS | Status: DC | PRN
  Administered 2019-12-27: 10 mL via INTRAVENOUS
  Filled 2019-12-27: qty 10

## 2019-12-27 MED ORDER — SODIUM CHLORIDE 0.9 % IV SOLN
INTRAVENOUS | Status: DC
  Filled 2019-12-27 (×2): qty 250

## 2019-12-27 MED ORDER — HEPARIN SOD (PORK) LOCK FLUSH 100 UNIT/ML IV SOLN
500.0000 [IU] | Freq: Once | INTRAVENOUS | Status: AC
  Administered 2019-12-27: 500 [IU] via INTRAVENOUS
  Filled 2019-12-27: qty 5

## 2019-12-27 MED ORDER — POTASSIUM CHLORIDE CRYS ER 20 MEQ PO TBCR
EXTENDED_RELEASE_TABLET | ORAL | Status: AC
  Filled 2019-12-27: qty 4

## 2019-12-27 MED ORDER — POTASSIUM CHLORIDE CRYS ER 20 MEQ PO TBCR
40.0000 meq | EXTENDED_RELEASE_TABLET | Freq: Once | ORAL | Status: AC
  Administered 2019-12-27: 40 meq via ORAL

## 2019-12-27 MED ORDER — ONDANSETRON HCL 4 MG/2ML IJ SOLN
INTRAMUSCULAR | Status: AC
  Filled 2019-12-27: qty 4

## 2019-12-27 MED ORDER — POTASSIUM CHLORIDE CRYS ER 20 MEQ PO TBCR: 20.0000 meq | EXTENDED_RELEASE_TABLET | Freq: Every day | ORAL | 0 refills | Status: AC

## 2019-12-27 MED ORDER — ONDANSETRON HCL 4 MG/2ML IJ SOLN
8.0000 mg | Freq: Once | INTRAMUSCULAR | Status: AC
  Administered 2019-12-27: 8 mg via INTRAVENOUS

## 2019-12-27 NOTE — Patient Instructions (Addendum)
APPT WITH [facility] DR RADIONCHENKO ON Tuesday, December 31, 2019 AT 10:15am.       Dehydration, Adult  Dehydration is condition in which there is not enough water or other fluids in the body. This happens when a person loses more fluids than he or she takes in. Important body parts cannot work right without the right amount of fluids. Any loss of fluids from the body can cause dehydration. Dehydration can be mild, worse, or very bad. It should be treated right away to keep it from getting very bad. What are the causes? This condition may be caused by:  Conditions that cause loss of water or other fluids, such as: ? Watery poop (diarrhea). ? Vomiting. ? Sweating a lot. ? Peeing (urinating) a lot.  Not drinking enough fluids, especially when you: ? Are ill. ? Are doing things that take a lot of energy to do.  Other illnesses and conditions, such as fever or infection.  Certain medicines, such as medicines that take extra fluid out of the body (diuretics).  Lack of safe drinking water.  Not being able to get enough water and food. What increases the risk? The following factors may make you more likely to develop this condition:  Having a long-term (chronic) illness that has not been treated the right way, such as: ? Diabetes. ? Heart disease. ? Kidney disease.  Being 26 years of age or older.  Having a disability.  Living in a place that is high above the ground or sea (high in altitude). The thinner, dried air causes more fluid loss.  Doing exercises that put stress on your body for a long time. What are the signs or symptoms? Symptoms of dehydration depend on how bad it is. Mild or worse dehydration  Thirst.  Dry lips or dry mouth.  Feeling dizzy or light-headed, especially when you stand up from sitting.  Muscle cramps.  Your body making: ? Dark pee (urine). Pee may be the color of tea. ? Less pee than normal. ? Less tears than  normal.  Headache. Very bad dehydration  Changes in skin. Skin may: ? Be cold to the touch (clammy). ? Be blotchy or pale. ? Not go back to normal right after you lightly pinch it and let it go.  Little or no tears, pee, or sweat.  Changes in vital signs, such as: ? Fast breathing. ? Low blood pressure. ? Weak pulse. ? Pulse that is more than 100 beats a minute when you are sitting still.  Other changes, such as: ? Feeling very thirsty. ? Eyes that look hollow (sunken). ? Cold hands and feet. ? Being mixed up (confused). ? Being very tired (lethargic) or having trouble waking from sleep. ? Short-term weight loss. ? Loss of consciousness. How is this treated? Treatment for this condition depends on how bad it is. Treatment should start right away. Do not wait until your condition gets very bad. Very bad dehydration is an emergency. You will need to go to a hospital.  Mild or worse dehydration can be treated at home. You may be asked to: ? Drink more fluids. ? Drink an oral rehydration solution (ORS). This drink helps get the right amounts of fluids and salts and minerals in the blood (electrolytes).  Very bad dehydration can be treated: ? With fluids through an IV tube. ? By getting normal levels of salts and minerals in your blood. This is often done by giving salts and minerals through a tube.  The tube is passed through your nose and into your stomach. ? By treating the root cause. Follow these instructions at home: Oral rehydration solution If told by your doctor, drink an ORS:  Make an ORS. Use instructions on the package.  Start by drinking small amounts, about  cup (120 mL) every 5-10 minutes.  Slowly drink more until you have had the amount that your doctor said to have. Eating and drinking         Drink enough clear fluid to keep your pee pale yellow. If you were told to drink an ORS, finish the ORS first. Then, start slowly drinking other clear fluids.  Drink fluids such as: ? Water. Do not drink only water. Doing that can make the salt (sodium) level in your body get too low. ? Water from ice chips you suck on. ? Fruit juice that you have added water to (diluted). ? Low-calorie sports drinks.  Eat foods that have the right amounts of salts and minerals, such as: ? Bananas. ? Oranges. ? Potatoes. ? Tomatoes. ? Spinach.  Do not drink alcohol.  Avoid: ? Drinks that have a lot of sugar. These include:  High-calorie sports drinks.  Fruit juice that you did not add water to.  Soda.  Caffeine. ? Foods that are greasy or have a lot of fat or sugar. General instructions  Take over-the-counter and prescription medicines only as told by your doctor.  Do not take salt tablets. Doing that can make the salt level in your body get too high.  Return to your normal activities as told by your doctor. Ask your doctor what activities are safe for you.  Keep all follow-up visits as told by your doctor. This is important. Contact a doctor if:  You have pain in your belly (abdomen) and the pain: ? Gets worse. ? Stays in one place.  You have a rash.  You have a stiff neck.  You get angry or annoyed (irritable) more easily than normal.  You are more tired or have a harder time waking than normal.  You feel: ? Weak or dizzy. ? Very thirsty. Get help right away if you have:  Any symptoms of very bad dehydration.  Symptoms of vomiting, such as: ? You cannot eat or drink without vomiting. ? Your vomiting gets worse or does not go away. ? Your vomit has blood or green stuff in it.  Symptoms that get worse with treatment.  A fever.  A very bad headache.  Problems with peeing or pooping (having a bowel movement), such as: ? Watery poop that gets worse or does not go away. ? Blood in your poop (stool). This may cause poop to look black and tarry. ? Not peeing in 6-8 hours. ? Peeing only a small amount of very dark pee in 6-8  hours.  Trouble breathing. These symptoms may be an emergency. Do not wait to see if the symptoms will go away. Get medical help right away. Call your local emergency services (911 in the [place] Do not drive yourself to the hospital. Summary  Dehydration is a condition in which there is not enough water or other fluids in the body. This happens when a person loses more fluids than he or she takes in.  Treatment for this condition depends on how bad it is. Treatment should be started right away. Do not wait until your condition gets very bad.  Drink enough clear fluid to keep your pee pale yellow. If you  were told to drink an oral rehydration solution (ORS), finish the ORS first. Then, start slowly drinking other clear fluids.  Take over-the-counter and prescription medicines only as told by your doctor.  Get help right away if you have any symptoms of very bad dehydration. This information is not intended to replace advice given to you by your health care provider. Make sure you discuss any questions you have with your health care provider. Document Revised: 09/20/2018 Document Reviewed: 09/20/2018 Elsevier Patient Education  [employer].

## 2019-12-27 NOTE — Progress Notes (Signed)
Pt discharged in no apparent distress. Pt left ambulatory without assistance. Pt aware of discharge instructions and verbalized understanding and had no further questions.  

## 2019-12-27 NOTE — Patient Instructions (Signed)

## 2019-12-27 NOTE — Progress Notes (Signed)
Hematology and Oncology Follow Up Visit  Kristine Sullivan [id number] Oct 24, 1931 86 y.o. 12/27/2019   Principle Diagnosis:  Marginal zone lymphoma -- recurrent Iron deficiency anemia Autoimmune hemolytic anemia -- warm antibody  Past therapy: CVP -- completed 8 cycles on 04/09/2012 Maintenance Rituxan - 2 years completed in June 2016 Rituxan/Cytoxan -- startedon 11/08/2018, s/p cycle #2, Cytoxan d/c'd 12/19/2018  Current Therapy: Rituxan/Bendamustine - startedon 04/17/2019, s/p cycle #6 --will omit day #2 of bendamustine for the last 2 cycles  IV Iron as needed   Interim History:  Kristine Sullivan is here today for follow-up.  Kristine Sullivan has not feeling well.  Kristine Sullivan has not felt well for about a week or so.  Kristine Sullivan says that Kristine Sullivan appetite is down a little bit.  Kristine Sullivan has had no chest pain.  Is been no shortness of breath.  Kristine Sullivan has had no diarrhea.  There is been no obvious change in bowel or bladder habits.  Kristine Sullivan has had no fever.  When I listen to Kristine Sullivan, Kristine Sullivan heart was clearly erratic.  I did go ahead and get an EKG on Kristine Sullivan.  This showed sinus tachycardia with quite a few PACs.  As far as I can remember Kristine Sullivan has never had any kind of heart difficulty since we been seeing Kristine Sullivan.  I went across the hall and spoke with Dr. Sherren Mocha of cardiology.  Kristine Sullivan explained what the EKG showed.  They would be glad to see Kristine Sullivan.  I know that Kristine Sullivan does have a cardiologist that Kristine Sullivan sees in [facility].  We called Kristine Sullivan cardiologist office.  I explained what was going on.  They will see Kristine Sullivan next week.  Kristine Sullivan has had no leg swelling.  Kristine Sullivan really just cannot explain why Kristine Sullivan does not feel well.  I am not sure if the cardiac arrhythmia is contributing.  Kristine Sullivan potassium is on the low side.  We will go ahead and give he some oral potassium along with some IV fluids.  Kristine Sullivan will clearly not get any treatment today.  Thankfully Kristine Sullivan anemia is not a problem.  Kristine Sullivan clearly is not anemic.  I would have to say that Kristine Sullivan performance status  right now is ECOG 2.  Medications:  Allergies as of 12/27/2019      Reactions   Lisinopril Swelling   Facial swelling    Pregabalin Other (See Comments)   constipation   Timolol Swelling, Other (See Comments)      Medication List       Accurate as of December 27, 2019  5:22 PM. If you have any questions, ask your nurse or doctor.        acyclovir 400 MG tablet Commonly known as: ZOVIRAX TAKE 1 TABLET BY MOUTH EVERY DAY   allopurinol 300 MG tablet Commonly known as: ZYLOPRIM TAKE 1 TABLET BY MOUTH EVERY DAY   apixaban 5 MG Tabs tablet Commonly known as: ELIQUIS Take 5 mg by mouth 2 (two) times daily.   aspirin 81 MG tablet Take 81 mg by mouth daily.   atorvastatin 80 MG tablet Commonly known as: LIPITOR Take 40 mg by mouth daily.   Calcium 600-D 600-400 MG-UNIT Tabs Generic drug: Calcium Carbonate-Vitamin D3 Take by mouth.   dexamethasone 4 MG tablet Commonly known as: DECADRON Take 2 tablets (8 mg total) by mouth daily. Start the day after bendamustine chemotherapy for 2 days. Take with food.   diltiazem 90 MG 12 hr capsule Commonly known as: CARDIZEM SR Take 1 capsule (90 mg total) by mouth  2 (two) times daily.   diphenhydrAMINE 25 MG tablet Commonly known as: SOMINEX Take 25 mg by mouth daily.   dorzolamide 2 % ophthalmic solution Commonly known as: TRUSOPT Place 1 drop into both eyes 3 (three) times daily.   furosemide 40 MG tablet Commonly known as: LASIX Take 40 mg by mouth daily.   ipratropium-albuterol 0.5-2.5 (3) MG/3ML Soln Commonly known as: DUONEB INHALE 3 ML BY NEBULIZATION EVERY SIX (6) HOURS AS NEEDED.   latanoprost 0.005 % ophthalmic solution Commonly known as: XALATAN Place 1 drop into both eyes at bedtime.   lidocaine-prilocaine cream Commonly known as: EMLA Apply to affected area once   loratadine 10 MG tablet Commonly known as: CLARITIN OTC Take by mouth one a day for 10 days, then once a day as needed -allergies.    LORazepam 0.5 MG tablet Commonly known as: Ativan Take 1 tablet (0.5 mg total) by mouth every 6 (six) hours as needed (Nausea or vomiting).   ondansetron 8 MG tablet Commonly known as: Zofran Take 1 tablet (8 mg total) by mouth 2 (two) times daily as needed for refractory nausea / vomiting. Start on day 2 after bendamustine chemo.   potassium chloride SA 20 MEQ tablet Commonly known as: KLOR-CON Take 1 tablet (20 mEq total) by mouth daily. What changed:   medication strength  how much to take   prochlorperazine 10 MG tablet Commonly known as: COMPAZINE Take 1 tablet (10 mg total) by mouth every 6 (six) hours as needed (Nausea or vomiting).   spironolactone 25 MG tablet Commonly known as: ALDACTONE Take by mouth.   Vitamin D (Ergocalciferol) 1.25 MG (50000 UNIT) Caps capsule Commonly known as: DRISDOL Take 1 capsule (50,000 Units total) by mouth once a week.       Allergies:  Allergies  Allergen Reactions  . Lisinopril Swelling    Facial swelling     . Pregabalin Other (See Comments)    constipation  . Timolol Swelling and Other (See Comments)    Past Medical History, Surgical history, Social history, and Family History were reviewed and updated.  Review of Systems: Review of Systems  Constitutional: Negative.   HENT: Negative.   Eyes: Negative.   Respiratory: Negative.   Cardiovascular: Negative.   Gastrointestinal: Negative.   Genitourinary: Negative.   Musculoskeletal: Negative.   Skin: Negative.   Neurological: Negative.   Endo/Heme/Allergies: Negative.   Psychiatric/Behavioral: Negative.      Physical Exam:  weight is 142 lb (64.4 kg). Kristine Sullivan oral temperature is 97.7 F (36.5 C). Kristine Sullivan blood pressure is 129/82 and Kristine Sullivan pulse is 53 (abnormal). Kristine Sullivan respiration is 18 and oxygen saturation is 98%.   Wt Readings from Last 3 Encounters:  12/27/19 142 lb (64.4 kg)  11/22/19 144 lb (65.3 kg)  10/18/19 148 lb (67.1 kg)    Physical Exam Vitals reviewed.   HENT:     Head: Normocephalic and atraumatic.  Eyes:     Pupils: Pupils are equal, round, and reactive to light.  Neck:     Comments: I cannot palpate any adenopathy in the neck. Cardiovascular:     Rate and Rhythm: Tachycardia present. Rhythm irregular.     Heart sounds: Normal heart sounds.     Comments: Cardiac exam is tachycardic and irregular.  Kristine Sullivan has quite a few premature beats.  I do not hear any obvious murmurs. Pulmonary:     Effort: Pulmonary effort is normal.     Breath sounds: Normal breath sounds.  Comments: Lungs sound clear bilaterally.  I do not hear any wheezes.  There is no rhonchi.  Kristine Sullivan has good air movement bilaterally. Abdominal:     General: Bowel sounds are normal.     Palpations: Abdomen is soft.  Musculoskeletal:        General: No tenderness or deformity. Normal range of motion.     Cervical back: Normal range of motion.     Comments: Extremities shows no clubbing, cyanosis or edema.  Kristine Sullivan has no rashes.  Kristine Sullivan has decent strength bilaterally.  Lymphadenopathy:     Cervical: No cervical adenopathy.  Skin:    General: Skin is warm and dry.     Findings: No erythema or rash.  Neurological:     Mental Status: Kristine Sullivan is alert and oriented to person, place, and time.  Psychiatric:        Behavior: Behavior normal.        Thought Content: Thought content normal.        Judgment: Judgment normal.      Lab Results  Component Value Date   WBC 3.6 (L) 12/27/2019   HGB 13.5 12/27/2019   HCT 41.1 12/27/2019   MCV 90.1 12/27/2019   PLT 113 (L) 12/27/2019   Lab Results  Component Value Date   FERRITIN 3,702 (H) 12/27/2019   IRON 75 12/27/2019   TIBC 244 12/27/2019   UIBC 169 12/27/2019   IRONPCTSAT 31 12/27/2019   Lab Results  Component Value Date   RETICCTPCT 2.7 12/27/2019   RBC 4.56 12/27/2019   RETICCTABS 59.2 11/13/2013   No results found for: KPAFRELGTCHN, LAMBDASER, [facility] Lab Results  Component Value Date   IGGSERUM 557 (L)  06/19/2013   IGA 100 06/19/2013   IGMSERUM 95 06/19/2013   Lab Results  Component Value Date   TOTALPROTELP 6.3 09/16/2011   ALBUMINELP 59.9 09/16/2011   A1GS 7.3 (H) 09/16/2011   A2GS 9.9 09/16/2011   BETS 8.4 (H) 09/16/2011   BETA2SER 4.8 09/16/2011   GAMS 9.7 (L) 09/16/2011   MSPIKE NOT DET 09/16/2011   SPEI * 09/16/2011     Chemistry      Component Value Date/Time   NA 141 12/27/2019 0925   NA 140 09/16/2016 0945   K 3.0 (L) 12/27/2019 0925   K 3.2 (L) 09/16/2016 0945   CL 103 12/27/2019 0925   CL 101 12/08/2014 1138   CL 99 05/28/2012 1036   CO2 29 12/27/2019 0925   CO2 26 09/16/2016 0945   BUN 10 12/27/2019 0925   BUN 9.0 09/16/2016 0945   CREATININE 0.81 12/27/2019 0925   CREATININE 0.7 09/16/2016 0945      Component Value Date/Time   CALCIUM 8.9 12/27/2019 0925   CALCIUM 9.0 09/16/2016 0945   ALKPHOS 113 12/27/2019 0925   ALKPHOS 122 09/16/2016 0945   AST 21 12/27/2019 0925   AST 25 09/16/2016 0945   ALT 20 12/27/2019 0925   ALT 15 09/16/2016 0945   BILITOT 0.8 12/27/2019 0925   BILITOT 0.90 09/16/2016 0945      Impression and Plan: Ms. Vanaken is a very pleasant 86 yo African American female with a history of marginal zone lymphoma, angioedema and autoimmune hemolytic anemia.  Again, the Rituxan/ bendamustine combination is working quite well.  We are not going to treat Kristine Sullivan today.  I am not sure exactly why Kristine Sullivan does not feel well.  Again, Kristine Sullivan has the cardiac arrhythmia right now.  Kristine Sullivan is relatively stable with Kristine Sullivan blood pressure.  Is been going to Kristine Sullivan cardiologist in [facility], Kristine Sullivan says that there is a past history of atrial fibrillation.  Thankfully, there is no atrial fibrillation today.  Hopefully, the IV fluids and potassium will help.  We will make sure that Kristine Sullivan is on some potassium at home.  We will get Kristine Sullivan back to see [place] in a couple weeks and hopefully Kristine Sullivan will be feeling better.   [name], MD 11/5/20215:22 PM

## 2019-12-30 ENCOUNTER — Telehealth: Payer: Self-pay | Admitting: Hematology & Oncology

## 2019-12-30 NOTE — Telephone Encounter (Signed)
Appointments scheduled calendar printed & mailed per 11/5 los 

## 2020-01-08 ENCOUNTER — Other Ambulatory Visit: Payer: Self-pay | Admitting: Hematology & Oncology

## 2020-01-13 ENCOUNTER — Inpatient Hospital Stay: Payer: Medicare Other

## 2020-01-13 ENCOUNTER — Other Ambulatory Visit (HOSPITAL_BASED_OUTPATIENT_CLINIC_OR_DEPARTMENT_OTHER): Payer: Self-pay | Admitting: Internal Medicine

## 2020-01-13 ENCOUNTER — Ambulatory Visit: Payer: Medicare Other | Attending: Internal Medicine

## 2020-01-13 ENCOUNTER — Encounter: Payer: Self-pay | Admitting: Family

## 2020-01-13 ENCOUNTER — Telehealth: Payer: Self-pay

## 2020-01-13 ENCOUNTER — Other Ambulatory Visit: Payer: Self-pay

## 2020-01-13 ENCOUNTER — Inpatient Hospital Stay (HOSPITAL_BASED_OUTPATIENT_CLINIC_OR_DEPARTMENT_OTHER): Payer: Medicare Other | Admitting: Family

## 2020-01-13 VITALS — BP 112/57 | HR 63 | Temp 97.6°F | Resp 18

## 2020-01-13 VITALS — BP 143/75 | HR 78 | Temp 97.7°F | Resp 17 | Ht 62.0 in | Wt 147.0 lb

## 2020-01-13 DIAGNOSIS — D5911 Warm autoimmune hemolytic anemia: Secondary | ICD-10-CM

## 2020-01-13 DIAGNOSIS — G629 Polyneuropathy, unspecified: Secondary | ICD-10-CM | POA: Diagnosis not present

## 2020-01-13 DIAGNOSIS — C8307 Small cell B-cell lymphoma, spleen: Secondary | ICD-10-CM | POA: Diagnosis not present

## 2020-01-13 DIAGNOSIS — Z5112 Encounter for antineoplastic immunotherapy: Secondary | ICD-10-CM | POA: Diagnosis not present

## 2020-01-13 DIAGNOSIS — D500 Iron deficiency anemia secondary to blood loss (chronic): Secondary | ICD-10-CM

## 2020-01-13 DIAGNOSIS — Z23 Encounter for immunization: Secondary | ICD-10-CM

## 2020-01-13 LAB — IRON AND TIBC
Iron: 111 ug/dL (ref 41–142)
Saturation Ratios: 38 % (ref 21–57)
TIBC: 296 ug/dL (ref 236–444)
UIBC: 185 ug/dL (ref 120–384)

## 2020-01-13 LAB — CBC WITH DIFFERENTIAL (CANCER CENTER ONLY)
Abs Immature Granulocytes: 0.01 10*3/uL (ref 0.00–0.07)
Basophils Absolute: 0 10*3/uL (ref 0.0–0.1)
Basophils Relative: 0 %
Eosinophils Absolute: 0 10*3/uL (ref 0.0–0.5)
Eosinophils Relative: 1 %
HCT: 40.1 % (ref 36.0–46.0)
Hemoglobin: 12.9 g/dL (ref 12.0–15.0)
Immature Granulocytes: 0 %
Lymphocytes Relative: 13 %
Lymphs Abs: 0.4 10*3/uL — ABNORMAL LOW (ref 0.7–4.0)
MCH: 29.7 pg (ref 26.0–34.0)
MCHC: 32.2 g/dL (ref 30.0–36.0)
MCV: 92.2 fL (ref 80.0–100.0)
Monocytes Absolute: 0.7 10*3/uL (ref 0.1–1.0)
Monocytes Relative: 25 %
Neutro Abs: 1.6 10*3/uL — ABNORMAL LOW (ref 1.7–7.7)
Neutrophils Relative %: 61 %
Platelet Count: 97 10*3/uL — ABNORMAL LOW (ref 150–400)
RBC: 4.35 MIL/uL (ref 3.87–5.11)
RDW: 14.6 % (ref 11.5–15.5)
WBC Count: 2.7 10*3/uL — ABNORMAL LOW (ref 4.0–10.5)
nRBC: 0 % (ref 0.0–0.2)

## 2020-01-13 LAB — RETICULOCYTES
Immature Retic Fract: 10.6 % (ref 2.3–15.9)
RBC.: 4.39 MIL/uL (ref 3.87–5.11)
Retic Count, Absolute: 126.4 10*3/uL (ref 19.0–186.0)
Retic Ct Pct: 2.9 % (ref 0.4–3.1)

## 2020-01-13 LAB — CMP (CANCER CENTER ONLY)
ALT: 16 U/L (ref 0–44)
AST: 20 U/L (ref 15–41)
Albumin: 3.7 g/dL (ref 3.5–5.0)
Alkaline Phosphatase: 100 U/L (ref 38–126)
Anion gap: 7 (ref 5–15)
BUN: 11 mg/dL (ref 8–23)
CO2: 28 mmol/L (ref 22–32)
Calcium: 9.1 mg/dL (ref 8.9–10.3)
Chloride: 105 mmol/L (ref 98–111)
Creatinine: 0.72 mg/dL (ref 0.44–1.00)
GFR, Estimated: >60 mL/min (ref >60)
Glucose, Bld: 111 mg/dL — ABNORMAL HIGH (ref 70–99)
Potassium: 4.1 mmol/L (ref 3.5–5.1)
Sodium: 140 mmol/L (ref 135–145)
Total Bilirubin: 0.6 mg/dL (ref 0.3–1.2)
Total Protein: 5.8 g/dL — ABNORMAL LOW (ref 6.5–8.1)

## 2020-01-13 LAB — FERRITIN: Ferritin: 2436 ng/mL — ABNORMAL HIGH (ref 11–307)

## 2020-01-13 LAB — LACTATE DEHYDROGENASE: LDH: 283 U/L — ABNORMAL HIGH (ref 98–192)

## 2020-01-13 MED ORDER — PALONOSETRON HCL INJECTION 0.25 MG/5ML
INTRAVENOUS | Status: AC
  Filled 2020-01-13: qty 5

## 2020-01-13 MED ORDER — PALONOSETRON HCL INJECTION 0.25 MG/5ML
0.2500 mg | Freq: Once | INTRAVENOUS | Status: AC
  Administered 2020-01-13: 0.25 mg via INTRAVENOUS

## 2020-01-13 MED ORDER — DIPHENHYDRAMINE HCL 25 MG PO CAPS
ORAL_CAPSULE | ORAL | Status: AC
  Filled 2020-01-13: qty 2

## 2020-01-13 MED ORDER — ACETAMINOPHEN 325 MG PO TABS
ORAL_TABLET | ORAL | Status: AC
  Filled 2020-01-13: qty 2

## 2020-01-13 MED ORDER — SODIUM CHLORIDE 0.9 % IV SOLN
600.0000 mg | Freq: Once | INTRAVENOUS | Status: AC
  Administered 2020-01-13: 600 mg via INTRAVENOUS
  Filled 2020-01-13: qty 10

## 2020-01-13 MED ORDER — SODIUM CHLORIDE 0.9 % IV SOLN
72.0000 mg/m2 | Freq: Once | INTRAVENOUS | Status: AC
  Administered 2020-01-13: 125 mg via INTRAVENOUS
  Filled 2020-01-13: qty 5

## 2020-01-13 MED ORDER — SODIUM CHLORIDE 0.9% FLUSH
10.0000 mL | INTRAVENOUS | Status: DC | PRN
  Administered 2020-01-13: 10 mL
  Filled 2020-01-13: qty 10

## 2020-01-13 MED ORDER — HEPARIN SOD (PORK) LOCK FLUSH 100 UNIT/ML IV SOLN
500.0000 [IU] | Freq: Once | INTRAVENOUS | Status: AC | PRN
  Administered 2020-01-13: 500 [IU]
  Filled 2020-01-13: qty 5

## 2020-01-13 MED ORDER — GABAPENTIN 300 MG PO CAPS: 300.0000 mg | ORAL_CAPSULE | Freq: Every day | ORAL | 1 refills | Status: DC

## 2020-01-13 MED ORDER — ACETAMINOPHEN 325 MG PO TABS
650.0000 mg | ORAL_TABLET | Freq: Once | ORAL | Status: AC
  Administered 2020-01-13: 650 mg via ORAL

## 2020-01-13 MED ORDER — SODIUM CHLORIDE 0.9 % IV SOLN
Freq: Once | INTRAVENOUS | Status: AC
  Filled 2020-01-13: qty 250

## 2020-01-13 MED ORDER — SODIUM CHLORIDE 0.9 % IV SOLN
10.0000 mg | Freq: Once | INTRAVENOUS | Status: AC
  Administered 2020-01-13: 10 mg via INTRAVENOUS
  Filled 2020-01-13: qty 10

## 2020-01-13 MED ORDER — DIPHENHYDRAMINE HCL 25 MG PO CAPS
50.0000 mg | ORAL_CAPSULE | Freq: Once | ORAL | Status: AC
  Administered 2020-01-13: 50 mg via ORAL

## 2020-01-13 MED FILL — MODERNA COVID-19 VACCINE 10: 100 | 1 days supply | Qty: 1 | Fill #0

## 2020-01-13 NOTE — Patient Instructions (Signed)
[facility] Discharge Instructions for Patients Receiving Chemotherapy  Today you received the following chemotherapy agents:  Rituxan & Bendeka  To help prevent nausea and vomiting after your treatment, we encourage you to take your nausea medication as prescribed.   If you develop nausea and vomiting that is not controlled by your nausea medication, call the clinic.   BELOW ARE SYMPTOMS THAT SHOULD BE REPORTED IMMEDIATELY:  *FEVER GREATER THAN 100.5 F  *CHILLS WITH OR WITHOUT FEVER  NAUSEA AND VOMITING THAT IS NOT CONTROLLED WITH YOUR NAUSEA MEDICATION  *UNUSUAL SHORTNESS OF BREATH  *UNUSUAL BRUISING OR BLEEDING  TENDERNESS IN MOUTH AND THROAT WITH OR WITHOUT PRESENCE OF ULCERS  *URINARY PROBLEMS  *BOWEL PROBLEMS  UNUSUAL RASH Items with * indicate a potential emergency and should be followed up as soon as possible.  Feel free to call the clinic should you have any questions or concerns. The clinic phone number is [phone].  Please show the CHEMO ALERT CARD at check-in to the Emergency Department and triage nurse.   

## 2020-01-13 NOTE — Progress Notes (Signed)
Pt discharged in no apparent distress. Pt left ambulatory with cane.. Pt aware of discharge instructions and verbalized understanding and had no further questions. 

## 2020-01-13 NOTE — Telephone Encounter (Signed)
appts made per 01/13/20 los, pt will receive AVS in tx today with appts....Marland KitchenAOM

## 2020-01-13 NOTE — Progress Notes (Signed)
   Covid-19 Vaccination Clinic  Name:  Takeila Thayne    MRN: [id number] DOB: 17-Sep-1931  01/13/2020  Ms. Mandel was observed post Covid-19 immunization for 15 minutes without incident. She was provided with Vaccine Information Sheet and instruction to access the V-Safe system.   Ms. Polinsky was instructed to call 911 with any severe reactions post vaccine: [name] Difficulty breathing  . Swelling of face and throat  . A fast heartbeat  . A bad rash all over body  . Dizziness and weakness   Immunizations Administered    Name Date Dose VIS Date Route   Moderna COVID-19 Vaccine 01/13/2020  2:01 PM 0.5 mL 12/11/2019 Intramuscular   Manufacturer: Moderna   Lot: [id number]   [place]: [id number]

## 2020-01-13 NOTE — Progress Notes (Signed)
Hematology and Oncology Follow Up Visit  Aleane Wesenberg [id number] 1931/03/31 86 y.o. 01/13/2020   Principle Diagnosis:  Marginal zone lymphoma -- recurrent Iron deficiency anemia Autoimmune hemolytic anemia -- warm antibody  Past therapy: CVP -- completed 8 cycles on 04/09/2012 Maintenance Rituxan - 2 years completed in June 2016 Rituxan/Cytoxan -- startedon 11/08/2018, s/p cycle #2, Cytoxan d/c'd 12/19/2018  Current Therapy: Rituxan/Bendamustine - startedon 04/17/2019, s/p cycle7 --will omit day 2 of bendamustine for the last 2 cycles IV Iron as needed   Interim History:  Ms. Bultman is here today for follow-up and her last Rituxan treatment. She is feeling much better but still has some fatigue and mild SOB at times. She states that she will take a break to rest when needed.  She was able to see her cardiologist and states that they did and ECHO and it looked good. No result available in Care Everywhere at this time. She states that she is now scheduled for a stress test on 12/15.  No fever chills, n/v, cough, rash, dizziness, chest pain, palpitations, abdominal pain or changes in bowel or bladder habits.  She has neuropathy in her fingers, legs and feet. She states that this has effected her dexterity and her legs hurt at night keeping her awake.  No swelling or tenderness in her extremities.  No falls or syncope.  No blood loss, no abnormal bruising or petechiae.  Her appetite has improved and she is doing her best to stay well hydrated. Her weight is stable at 147 lbs.   ECOG Performance Status: 1 - Symptomatic but completely ambulatory  Medications:  Allergies as of 01/13/2020      Reactions   Lisinopril Swelling   Facial swelling    Pregabalin Other (See Comments)   constipation   Timolol Swelling, Other (See Comments)      Medication List       Accurate as of January 13, 2020  9:09 AM. If you have any questions, ask your nurse or doctor.         acyclovir 400 MG tablet Commonly known as: ZOVIRAX TAKE 1 TABLET BY MOUTH EVERY DAY   allopurinol 300 MG tablet Commonly known as: ZYLOPRIM TAKE 1 TABLET BY MOUTH EVERY DAY   apixaban 5 MG Tabs tablet Commonly known as: ELIQUIS Take 5 mg by mouth 2 (two) times daily.   aspirin 81 MG tablet Take 81 mg by mouth daily.   atorvastatin 80 MG tablet Commonly known as: LIPITOR Take 40 mg by mouth daily.   Calcium 600-D 600-400 MG-UNIT Tabs Generic drug: Calcium Carbonate-Vitamin D3 Take by mouth.   dexamethasone 4 MG tablet Commonly known as: DECADRON Take 2 tablets (8 mg total) by mouth daily. Start the day after bendamustine chemotherapy for 2 days. Take with food.   diltiazem 90 MG 12 hr capsule Commonly known as: CARDIZEM SR TAKE 1 CAPSULE (90 MG TOTAL) BY MOUTH 2 (TWO) TIMES DAILY.   diphenhydrAMINE 25 MG tablet Commonly known as: SOMINEX Take 25 mg by mouth daily.   dorzolamide 2 % ophthalmic solution Commonly known as: TRUSOPT Place 1 drop into both eyes 3 (three) times daily.   furosemide 40 MG tablet Commonly known as: LASIX Take 40 mg by mouth daily.   ipratropium-albuterol 0.5-2.5 (3) MG/3ML Soln Commonly known as: DUONEB INHALE 3 ML BY NEBULIZATION EVERY SIX (6) HOURS AS NEEDED.   latanoprost 0.005 % ophthalmic solution Commonly known as: XALATAN Place 1 drop into both eyes at bedtime.   lidocaine-prilocaine cream  Commonly known as: EMLA Apply to affected area once   loratadine 10 MG tablet Commonly known as: CLARITIN OTC Take by mouth one a day for 10 days, then once a day as needed -allergies.   LORazepam 0.5 MG tablet Commonly known as: Ativan Take 1 tablet (0.5 mg total) by mouth every 6 (six) hours as needed (Nausea or vomiting).   ondansetron 8 MG tablet Commonly known as: Zofran Take 1 tablet (8 mg total) by mouth 2 (two) times daily as needed for refractory nausea / vomiting. Start on day 2 after bendamustine chemo.   potassium  chloride SA 20 MEQ tablet Commonly known as: KLOR-CON Take 1 tablet (20 mEq total) by mouth daily.   prochlorperazine 10 MG tablet Commonly known as: COMPAZINE Take 1 tablet (10 mg total) by mouth every 6 (six) hours as needed (Nausea or vomiting).   spironolactone 25 MG tablet Commonly known as: ALDACTONE Take by mouth.   Vitamin D (Ergocalciferol) 1.25 MG (50000 UNIT) Caps capsule Commonly known as: DRISDOL Take 1 capsule (50,000 Units total) by mouth once a week.       Allergies:  Allergies  Allergen Reactions  . Lisinopril Swelling    Facial swelling     . Pregabalin Other (See Comments)    constipation  . Timolol Swelling and Other (See Comments)    Past Medical History, Surgical history, Social history, and Family History were reviewed and updated.  Review of Systems: All other 10 point review of systems is negative.   Physical Exam:  height is 5\' 2"  (1.575 m) and weight is 147 lb (66.7 kg). Her oral temperature is 97.7 F (36.5 C). Her blood pressure is 143/75 (abnormal) and her pulse is 78. Her respiration is 17.   Wt Readings from Last 3 Encounters:  01/13/20 147 lb (66.7 kg)  01/13/20 147 lb (66.7 kg)  12/27/19 142 lb (64.4 kg)    Ocular: Sclerae unicteric, pupils equal, round and reactive to light Ear-nose-throat: Oropharynx clear, dentition fair Lymphatic: No cervical or supraclavicular adenopathy Lungs no rales or rhonchi, good excursion bilaterally Heart regular rate and irregular rhythm, no murmur appreciated Abd soft, nontender, positive bowel sounds MSK no focal spinal tenderness, no joint edema Neuro: non-focal, well-oriented, appropriate affect Breasts: Deferred   Lab Results  Component Value Date   WBC 2.7 (L) 01/13/2020   HGB 12.9 01/13/2020   HCT 40.1 01/13/2020   MCV 92.2 01/13/2020   PLT 97 (L) 01/13/2020   Lab Results  Component Value Date   FERRITIN 3,702 (H) 12/27/2019   IRON 75 12/27/2019   TIBC 244 12/27/2019   UIBC 169  12/27/2019   IRONPCTSAT 31 12/27/2019   Lab Results  Component Value Date   RETICCTPCT 2.9 01/13/2020   RBC 4.35 01/13/2020   RBC 4.39 01/13/2020   RETICCTABS 59.2 11/13/2013   No results found for: KPAFRELGTCHN, LAMBDASER, KAPLAMBRATIO Lab Results  Component Value Date   IGGSERUM 557 (L) 06/19/2013   IGA 100 06/19/2013   IGMSERUM 95 06/19/2013   Lab Results  Component Value Date   TOTALPROTELP 6.3 09/16/2011   ALBUMINELP 59.9 09/16/2011   A1GS 7.3 (H) 09/16/2011   A2GS 9.9 09/16/2011   BETS 8.4 (H) 09/16/2011   BETA2SER 4.8 09/16/2011   GAMS 9.7 (L) 09/16/2011   MSPIKE NOT DET 09/16/2011   SPEI * 09/16/2011     Chemistry      Component Value Date/Time   NA 140 01/13/2020 0820   NA 140 09/16/2016 0945  K 4.1 01/13/2020 0820   K 3.2 (L) 09/16/2016 0945   CL 105 01/13/2020 0820   CL 101 12/08/2014 1138   CL 99 05/28/2012 1036   CO2 28 01/13/2020 0820   CO2 26 09/16/2016 0945   BUN 11 01/13/2020 0820   BUN 9.0 09/16/2016 0945   CREATININE 0.72 01/13/2020 0820   CREATININE 0.7 09/16/2016 0945      Component Value Date/Time   CALCIUM 9.1 01/13/2020 0820   CALCIUM 9.0 09/16/2016 0945   ALKPHOS 100 01/13/2020 0820   ALKPHOS 122 09/16/2016 0945   AST 20 01/13/2020 0820   AST 25 09/16/2016 0945   ALT 16 01/13/2020 0820   ALT 15 09/16/2016 0945   BILITOT 0.6 01/13/2020 0820   BILITOT 0.90 09/16/2016 0945       Impression and Plan: Ms. Karnes is a very pleasant 86 yo African American female with a history of marginal zone lymphoma, angioedema and autoimmune hemolytic anemia.  We will proceed with cycle 8 today as planned per Dr. Marin Olp.  We will have her try Neurontin 300 mg PO at bedtime for her neuropathy. We discussed potential side effects in detail and she would like to try and see if this helps.  Follow-up in 6 weeks.  She was encouraged to contact our office with any questions or concerns.   Laverna Peace, NP 11/22/20219:09 AM

## 2020-01-13 NOTE — Progress Notes (Signed)
Ok to treat with platelets of 97 per Dr Marin Olp. dph

## 2020-02-24 ENCOUNTER — Inpatient Hospital Stay (HOSPITAL_BASED_OUTPATIENT_CLINIC_OR_DEPARTMENT_OTHER): Payer: Medicare Other | Admitting: Hematology & Oncology

## 2020-02-24 ENCOUNTER — Inpatient Hospital Stay: Payer: Medicare Other

## 2020-02-24 ENCOUNTER — Encounter: Payer: Self-pay | Admitting: Hematology & Oncology

## 2020-02-24 ENCOUNTER — Other Ambulatory Visit: Payer: Self-pay

## 2020-02-24 ENCOUNTER — Inpatient Hospital Stay: Payer: Medicare Other | Attending: Hematology & Oncology

## 2020-02-24 VITALS — BP 110/55 | HR 63 | Temp 98.2°F | Resp 17

## 2020-02-24 VITALS — BP 124/68 | HR 76 | Temp 97.6°F | Resp 18 | Wt 142.0 lb

## 2020-02-24 DIAGNOSIS — C8307 Small cell B-cell lymphoma, spleen: Secondary | ICD-10-CM | POA: Diagnosis not present

## 2020-02-24 DIAGNOSIS — Z95828 Presence of other vascular implants and grafts: Secondary | ICD-10-CM

## 2020-02-24 DIAGNOSIS — Z5112 Encounter for antineoplastic immunotherapy: Secondary | ICD-10-CM | POA: Insufficient documentation

## 2020-02-24 DIAGNOSIS — C8300 Small cell B-cell lymphoma, unspecified site: Secondary | ICD-10-CM | POA: Diagnosis present

## 2020-02-24 DIAGNOSIS — G629 Polyneuropathy, unspecified: Secondary | ICD-10-CM | POA: Insufficient documentation

## 2020-02-24 DIAGNOSIS — D500 Iron deficiency anemia secondary to blood loss (chronic): Secondary | ICD-10-CM

## 2020-02-24 DIAGNOSIS — D509 Iron deficiency anemia, unspecified: Secondary | ICD-10-CM | POA: Insufficient documentation

## 2020-02-24 DIAGNOSIS — D5911 Warm autoimmune hemolytic anemia: Secondary | ICD-10-CM | POA: Insufficient documentation

## 2020-02-24 LAB — CMP (CANCER CENTER ONLY)
ALT: 24 U/L (ref 0–44)
AST: 18 U/L (ref 15–41)
Albumin: 3.6 g/dL (ref 3.5–5.0)
Alkaline Phosphatase: 100 U/L (ref 38–126)
Anion gap: 10 (ref 5–15)
BUN: 20 mg/dL (ref 8–23)
CO2: 28 mmol/L (ref 22–32)
Calcium: 9.3 mg/dL (ref 8.9–10.3)
Chloride: 98 mmol/L (ref 98–111)
Creatinine: 1.03 mg/dL — ABNORMAL HIGH (ref 0.44–1.00)
GFR, Estimated: 53 mL/min — ABNORMAL LOW (ref >60)
Glucose, Bld: 146 mg/dL — ABNORMAL HIGH (ref 70–99)
Potassium: 4.1 mmol/L (ref 3.5–5.1)
Sodium: 136 mmol/L (ref 135–145)
Total Bilirubin: 0.7 mg/dL (ref 0.3–1.2)
Total Protein: 5.7 g/dL — ABNORMAL LOW (ref 6.5–8.1)

## 2020-02-24 LAB — CBC WITH DIFFERENTIAL (CANCER CENTER ONLY)
Abs Immature Granulocytes: 0.03 10*3/uL (ref 0.00–0.07)
Basophils Absolute: 0 10*3/uL (ref 0.0–0.1)
Basophils Relative: 0 %
Eosinophils Absolute: 0 10*3/uL (ref 0.0–0.5)
Eosinophils Relative: 0 %
HCT: 41.2 % (ref 36.0–46.0)
Hemoglobin: 13.5 g/dL (ref 12.0–15.0)
Immature Granulocytes: 1 %
Lymphocytes Relative: 10 %
Lymphs Abs: 0.4 10*3/uL — ABNORMAL LOW (ref 0.7–4.0)
MCH: 28.8 pg (ref 26.0–34.0)
MCHC: 32.8 g/dL (ref 30.0–36.0)
MCV: 88 fL (ref 80.0–100.0)
Monocytes Absolute: 0.5 10*3/uL (ref 0.1–1.0)
Monocytes Relative: 12 %
Neutro Abs: 2.9 10*3/uL (ref 1.7–7.7)
Neutrophils Relative %: 77 %
Platelet Count: 130 10*3/uL — ABNORMAL LOW (ref 150–400)
RBC: 4.68 MIL/uL (ref 3.87–5.11)
RDW: 14.3 % (ref 11.5–15.5)
WBC Count: 3.8 10*3/uL — ABNORMAL LOW (ref 4.0–10.5)
nRBC: 0 % (ref 0.0–0.2)

## 2020-02-24 LAB — RETICULOCYTES
Immature Retic Fract: 18.1 % — ABNORMAL HIGH (ref 2.3–15.9)
RBC.: 4.73 MIL/uL (ref 3.87–5.11)
Retic Count, Absolute: 111.6 10*3/uL (ref 19.0–186.0)
Retic Ct Pct: 2.4 % (ref 0.4–3.1)

## 2020-02-24 LAB — LACTATE DEHYDROGENASE: LDH: 195 U/L — ABNORMAL HIGH (ref 98–192)

## 2020-02-24 MED ORDER — HEPARIN SOD (PORK) LOCK FLUSH 100 UNIT/ML IV SOLN
500.0000 [IU] | Freq: Once | INTRAVENOUS | Status: AC | PRN
  Administered 2020-02-24: 500 [IU]
  Filled 2020-02-24: qty 5

## 2020-02-24 MED ORDER — SODIUM CHLORIDE 0.9 % IV SOLN
600.0000 mg | Freq: Once | INTRAVENOUS | Status: AC
  Administered 2020-02-24: 600 mg via INTRAVENOUS
  Filled 2020-02-24: qty 50

## 2020-02-24 MED ORDER — SODIUM CHLORIDE 0.9% FLUSH
10.0000 mL | INTRAVENOUS | Status: DC | PRN
Start: 2020-02-24 — End: 2020-02-24
  Administered 2020-02-24: 10 mL
  Filled 2020-02-24: qty 10

## 2020-02-24 MED ORDER — DIPHENHYDRAMINE HCL 25 MG PO CAPS
50.0000 mg | ORAL_CAPSULE | Freq: Once | ORAL | Status: AC
  Administered 2020-02-24: 50 mg via ORAL

## 2020-02-24 MED ORDER — HEPARIN SOD (PORK) LOCK FLUSH 100 UNIT/ML IV SOLN
500.0000 [IU] | Freq: Once | INTRAVENOUS | Status: AC
  Filled 2020-02-24: qty 5

## 2020-02-24 MED ORDER — ACETAMINOPHEN 325 MG PO TABS
650.0000 mg | ORAL_TABLET | Freq: Once | ORAL | Status: AC
  Administered 2020-02-24: 650 mg via ORAL

## 2020-02-24 MED ORDER — DIPHENHYDRAMINE HCL 25 MG PO CAPS
ORAL_CAPSULE | ORAL | Status: AC
  Filled 2020-02-24: qty 2

## 2020-02-24 MED ORDER — SODIUM CHLORIDE 0.9 % IV SOLN
10.0000 mg | Freq: Once | INTRAVENOUS | Status: DC
  Filled 2020-02-24: qty 1

## 2020-02-24 MED ORDER — SODIUM CHLORIDE 0.9 % IV SOLN
Freq: Once | INTRAVENOUS | Status: AC
  Filled 2020-02-24: qty 250

## 2020-02-24 MED ORDER — ACETAMINOPHEN 325 MG PO TABS
ORAL_TABLET | ORAL | Status: AC
  Filled 2020-02-24: qty 2

## 2020-02-24 MED ORDER — SODIUM CHLORIDE 0.9% FLUSH
10.0000 mL | INTRAVENOUS | Status: AC | PRN
  Filled 2020-02-24: qty 10

## 2020-02-24 MED ORDER — PALONOSETRON HCL INJECTION 0.25 MG/5ML: 0.2500 mg | Freq: Once | INTRAVENOUS | Status: DC

## 2020-02-24 NOTE — Addendum Note (Signed)
Addended by: Margaretha Seeds on: 02/24/2020 10:44 AM   Modules accepted: Orders, SmartSet

## 2020-02-24 NOTE — Progress Notes (Signed)
Hematology and Oncology Follow Up Visit  Zuria Sthill [id number] Mar 01, 1931 87 y.o. 02/24/2020   Principle Diagnosis:  Marginal zone lymphoma -- recurrent Iron deficiency anemia Autoimmune hemolytic anemia -- warm antibody  Past therapy: CVP -- completed 8 cycles on 04/09/2012 Maintenance Rituxan - 2 years completed in June 2016 Rituxan/Cytoxan -- startedon 11/08/2018, s/p cycle #2, Cytoxan d/c'd 12/19/2018 Rituxan-every 2 month dosing-maintenance-start 02/24/2020  Current Therapy: Rituxan/Bendamustine - startedon 04/17/2019, s/p cycle #6 --will omit day #2 of bendamustine for the last 2 cycles  IV Iron as needed   Interim History:  Ms. Kalp is here today for follow-up.  She actually was in the hospital right before Christmas.  It sounds like she had problems with the atrial fibrillation.  She seemed to get this taken care of.  Today, she is in a regular rhythm.  She feels better.  She has not had any problems with cough.  She had no fever.  She has had no rashes.  There has  been no bleeding.  I am just happy that she got through the cardiac issues.  Once she got home, she felt well.  She currently is feeling pretty well.  She has had no cough.  She has had no rashes.  Is been no nausea or vomiting.  She has had no change in bowel or bladder habits.  Overall, her performance status is ECOG 1.   Medications:  Allergies as of 02/24/2020      Reactions   Lisinopril Swelling   Facial swelling    Pregabalin Other (See Comments)   constipation   Timolol Swelling, Other (See Comments)      Medication List       Accurate as of February 24, 2020 10:47 AM. If you have any questions, ask your nurse or doctor.        acyclovir 400 MG tablet Commonly known as: ZOVIRAX TAKE 1 TABLET BY MOUTH EVERY DAY   allopurinol 300 MG tablet Commonly known as: ZYLOPRIM TAKE 1 TABLET BY MOUTH EVERY DAY   amiodarone 200 MG tablet Commonly known as: PACERONE Take 200 mg by  mouth daily.   apixaban 5 MG Tabs tablet Commonly known as: ELIQUIS Take 5 mg by mouth 2 (two) times daily.   aspirin 81 MG tablet Take 81 mg by mouth daily.   atorvastatin 80 MG tablet Commonly known as: LIPITOR Take 40 mg by mouth daily.   Calcium 600-D 600-400 MG-UNIT Tabs Generic drug: Calcium Carbonate-Vitamin D3 Take by mouth.   dexamethasone 4 MG tablet Commonly known as: DECADRON Take 2 tablets (8 mg total) by mouth daily. Start the day after bendamustine chemotherapy for 2 days. Take with food.   diltiazem 90 MG 12 hr capsule Commonly known as: CARDIZEM SR TAKE 1 CAPSULE (90 MG TOTAL) BY MOUTH 2 (TWO) TIMES DAILY.   diphenhydrAMINE 25 MG tablet Commonly known as: SOMINEX Take 25 mg by mouth daily.   dorzolamide 2 % ophthalmic solution Commonly known as: TRUSOPT Place 1 drop into both eyes 3 (three) times daily.   furosemide 40 MG tablet Commonly known as: LASIX Take 40 mg by mouth daily.   gabapentin 300 MG capsule Commonly known as: NEURONTIN Take 1 capsule (300 mg total) by mouth at bedtime.   ipratropium-albuterol 0.5-2.5 (3) MG/3ML Soln Commonly known as: DUONEB INHALE 3 ML BY NEBULIZATION EVERY SIX (6) HOURS AS NEEDED.   latanoprost 0.005 % ophthalmic solution Commonly known as: XALATAN Place 1 drop into both eyes at bedtime.  lidocaine-prilocaine cream Commonly known as: EMLA Apply to affected area once   loratadine 10 MG tablet Commonly known as: CLARITIN OTC Take by mouth one a day for 10 days, then once a day as needed -allergies.   LORazepam 0.5 MG tablet Commonly known as: Ativan Take 1 tablet (0.5 mg total) by mouth every 6 (six) hours as needed (Nausea or vomiting).   magnesium oxide 400 MG tablet Commonly known as: MAG-OX Take 1 tablet by mouth daily.   Matzim LA 240 MG 24 hr tablet Generic drug: diltiazem Take 240 mg by mouth daily.   ondansetron 8 MG tablet Commonly known as: Zofran Take 1 tablet (8 mg total) by mouth 2  (two) times daily as needed for refractory nausea / vomiting. Start on day 2 after bendamustine chemo.   potassium chloride SA 20 MEQ tablet Commonly known as: KLOR-CON Take 1 tablet (20 mEq total) by mouth daily.   prochlorperazine 10 MG tablet Commonly known as: COMPAZINE Take 1 tablet (10 mg total) by mouth every 6 (six) hours as needed (Nausea or vomiting).   spironolactone 25 MG tablet Commonly known as: ALDACTONE Take by mouth.   torsemide 20 MG tablet Commonly known as: DEMADEX Take 20 mg by mouth daily.   Vitamin D (Ergocalciferol) 1.25 MG (50000 UNIT) Caps capsule Commonly known as: DRISDOL Take 1 capsule (50,000 Units total) by mouth once a week.       Allergies:  Allergies  Allergen Reactions  . Lisinopril Swelling    Facial swelling     . Pregabalin Other (See Comments)    constipation  . Timolol Swelling and Other (See Comments)    Past Medical History, Surgical history, Social history, and Family History were reviewed and updated.  Review of Systems: Review of Systems  Constitutional: Negative.   HENT: Negative.   Eyes: Negative.   Respiratory: Negative.   Cardiovascular: Negative.   Gastrointestinal: Negative.   Genitourinary: Negative.   Musculoskeletal: Negative.   Skin: Negative.   Neurological: Negative.   Endo/Heme/Allergies: Negative.   Psychiatric/Behavioral: Negative.      Physical Exam:  weight is 142 lb (64.4 kg). Her oral temperature is 97.6 F (36.4 C). Her blood pressure is 124/68 and her pulse is 76. Her respiration is 18 and oxygen saturation is 99%.   Wt Readings from Last 3 Encounters:  02/24/20 142 lb (64.4 kg)  01/13/20 147 lb (66.7 kg)  01/13/20 147 lb (66.7 kg)    Physical Exam Vitals reviewed.  HENT:     Head: Normocephalic and atraumatic.  Eyes:     Pupils: Pupils are equal, round, and reactive to light.  Neck:     Comments: I cannot palpate any adenopathy in the neck. Cardiovascular:     Rate and Rhythm:  Tachycardia present. Rhythm irregular.     Heart sounds: Normal heart sounds.     Comments: Cardiac exam is tachycardic and irregular.  She has quite a few premature beats.  I do not hear any obvious murmurs. Pulmonary:     Effort: Pulmonary effort is normal.     Breath sounds: Normal breath sounds.     Comments: Lungs sound clear bilaterally.  I do not hear any wheezes.  There is no rhonchi.  She has good air movement bilaterally. Abdominal:     General: Bowel sounds are normal.     Palpations: Abdomen is soft.  Musculoskeletal:        General: No tenderness or deformity. Normal range of motion.  Cervical back: Normal range of motion.     Comments: Extremities shows no clubbing, cyanosis or edema.  She has no rashes.  She has decent strength bilaterally.  Lymphadenopathy:     Cervical: No cervical adenopathy.  Skin:    General: Skin is warm and dry.     Findings: No erythema or rash.  Neurological:     Mental Status: She is alert and oriented to person, place, and time.  Psychiatric:        Behavior: Behavior normal.        Thought Content: Thought content normal.        Judgment: Judgment normal.      Lab Results  Component Value Date   WBC 3.8 (L) 02/24/2020   HGB 13.5 02/24/2020   HCT 41.2 02/24/2020   MCV 88.0 02/24/2020   PLT 130 (L) 02/24/2020   Lab Results  Component Value Date   FERRITIN 2,436 (H) 01/13/2020   IRON 111 01/13/2020   TIBC 296 01/13/2020   UIBC 185 01/13/2020   IRONPCTSAT 38 01/13/2020   Lab Results  Component Value Date   RETICCTPCT 2.4 02/24/2020   RBC 4.68 02/24/2020   RETICCTABS 59.2 11/13/2013   No results found for: KPAFRELGTCHN, LAMBDASER, KAPLAMBRATIO Lab Results  Component Value Date   IGGSERUM 557 (L) 06/19/2013   IGA 100 06/19/2013   IGMSERUM 95 06/19/2013   Lab Results  Component Value Date   TOTALPROTELP 6.3 09/16/2011   ALBUMINELP 59.9 09/16/2011   A1GS 7.3 (H) 09/16/2011   A2GS 9.9 09/16/2011   BETS 8.4 (H)  09/16/2011   BETA2SER 4.8 09/16/2011   GAMS 9.7 (L) 09/16/2011   MSPIKE NOT DET 09/16/2011   SPEI * 09/16/2011     Chemistry      Component Value Date/Time   NA 140 01/13/2020 0820   NA 140 09/16/2016 0945   K 4.1 01/13/2020 0820   K 3.2 (L) 09/16/2016 0945   CL 105 01/13/2020 0820   CL 101 12/08/2014 1138   CL 99 05/28/2012 1036   CO2 28 01/13/2020 0820   CO2 26 09/16/2016 0945   BUN 11 01/13/2020 0820   BUN 9.0 09/16/2016 0945   CREATININE 0.72 01/13/2020 0820   CREATININE 0.7 09/16/2016 0945      Component Value Date/Time   CALCIUM 9.1 01/13/2020 0820   CALCIUM 9.0 09/16/2016 0945   ALKPHOS 100 01/13/2020 0820   ALKPHOS 122 09/16/2016 0945   AST 20 01/13/2020 0820   AST 25 09/16/2016 0945   ALT 16 01/13/2020 0820   ALT 15 09/16/2016 0945   BILITOT 0.6 01/13/2020 0820   BILITOT 0.90 09/16/2016 0945      Impression and Plan: Ms. Cotherman is a very pleasant 87 yo African American female with a history of marginal zone lymphoma, angioedema and autoimmune hemolytic anemia.  At this point, I will just have her on maintenance Rituxan.  I think this really is going be helpful.  If we do find that her hemoglobin starts dropping, I will readd bendamustine.  I am just happy that her quality of life is doing well.  We will plan to get her back in a month.  I just want him to follow-up with her.   [name], MD 1/3/202210:47 AM

## 2020-02-24 NOTE — Patient Instructions (Signed)
[facility] Discharge Instructions for Patients Receiving Chemotherapy  Today you received the following chemotherapy agents:  Rituxan   To help prevent nausea and vomiting after your treatment, we encourage you to take your nausea medication as prescribed.   If you develop nausea and vomiting that is not controlled by your nausea medication, call the clinic.   BELOW ARE SYMPTOMS THAT SHOULD BE REPORTED IMMEDIATELY:  *FEVER GREATER THAN 100.5 F  *CHILLS WITH OR WITHOUT FEVER  NAUSEA AND VOMITING THAT IS NOT CONTROLLED WITH YOUR NAUSEA MEDICATION  *UNUSUAL SHORTNESS OF BREATH  *UNUSUAL BRUISING OR BLEEDING  TENDERNESS IN MOUTH AND THROAT WITH OR WITHOUT PRESENCE OF ULCERS  *URINARY PROBLEMS  *BOWEL PROBLEMS  UNUSUAL RASH Items with * indicate a potential emergency and should be followed up as soon as possible.  Feel free to call the clinic should you have any questions or concerns. The clinic phone number is [phone].  Please show the CHEMO ALERT CARD at check-in to the Emergency Department and triage nurse.   

## 2020-02-24 NOTE — Addendum Note (Signed)
Addended by: Arlan Organ R on: 02/24/2020 11:21 AM   Modules accepted: Orders

## 2020-02-25 ENCOUNTER — Telehealth: Payer: Self-pay

## 2020-02-25 LAB — IRON AND TIBC
Iron: 102 ug/dL (ref 41–142)
Saturation Ratios: 38 % (ref 21–57)
TIBC: 271 ug/dL (ref 236–444)
UIBC: 169 ug/dL (ref 120–384)

## 2020-02-25 LAB — FERRITIN: Ferritin: 3773 ng/mL — ABNORMAL HIGH (ref 11–307)

## 2020-02-25 NOTE — Telephone Encounter (Signed)
Pt aware of her 03/2020 appts per 02/24/20 los, also have been mailed     aom

## 2020-03-31 ENCOUNTER — Inpatient Hospital Stay: Payer: Medicare Other

## 2020-03-31 ENCOUNTER — Inpatient Hospital Stay: Payer: Medicare Other | Attending: Hematology & Oncology

## 2020-03-31 ENCOUNTER — Inpatient Hospital Stay: Payer: Medicare Other | Admitting: Hematology & Oncology

## 2020-04-20 ENCOUNTER — Telehealth: Payer: Self-pay | Admitting: *Deleted

## 2020-04-20 NOTE — Telephone Encounter (Signed)
Message left from Param from rehab center requesting to make an appt for patient.  Message sent to scheduling.

## 2020-04-23 ENCOUNTER — Telehealth: Payer: Self-pay

## 2020-04-23 ENCOUNTER — Other Ambulatory Visit: Payer: Self-pay

## 2020-04-23 ENCOUNTER — Inpatient Hospital Stay: Payer: Medicare Other

## 2020-04-23 ENCOUNTER — Inpatient Hospital Stay: Payer: Medicare Other | Attending: Hematology & Oncology

## 2020-04-23 ENCOUNTER — Encounter: Payer: Self-pay | Admitting: Family

## 2020-04-23 ENCOUNTER — Inpatient Hospital Stay (HOSPITAL_BASED_OUTPATIENT_CLINIC_OR_DEPARTMENT_OTHER): Payer: Medicare Other | Admitting: Family

## 2020-04-23 VITALS — BP 112/60 | HR 85 | Temp 99.1°F | Resp 20

## 2020-04-23 DIAGNOSIS — D5911 Warm autoimmune hemolytic anemia: Secondary | ICD-10-CM

## 2020-04-23 DIAGNOSIS — R531 Weakness: Secondary | ICD-10-CM | POA: Diagnosis not present

## 2020-04-23 DIAGNOSIS — Z888 Allergy status to other drugs, medicaments and biological substances status: Secondary | ICD-10-CM | POA: Diagnosis not present

## 2020-04-23 DIAGNOSIS — R202 Paresthesia of skin: Secondary | ICD-10-CM | POA: Diagnosis not present

## 2020-04-23 DIAGNOSIS — D500 Iron deficiency anemia secondary to blood loss (chronic): Secondary | ICD-10-CM

## 2020-04-23 DIAGNOSIS — D509 Iron deficiency anemia, unspecified: Secondary | ICD-10-CM | POA: Insufficient documentation

## 2020-04-23 DIAGNOSIS — Z95828 Presence of other vascular implants and grafts: Secondary | ICD-10-CM

## 2020-04-23 DIAGNOSIS — Z9981 Dependence on supplemental oxygen: Secondary | ICD-10-CM | POA: Insufficient documentation

## 2020-04-23 DIAGNOSIS — R200 Anesthesia of skin: Secondary | ICD-10-CM | POA: Insufficient documentation

## 2020-04-23 DIAGNOSIS — M7989 Other specified soft tissue disorders: Secondary | ICD-10-CM | POA: Insufficient documentation

## 2020-04-23 DIAGNOSIS — K922 Gastrointestinal hemorrhage, unspecified: Secondary | ICD-10-CM | POA: Diagnosis not present

## 2020-04-23 DIAGNOSIS — R609 Edema, unspecified: Secondary | ICD-10-CM | POA: Insufficient documentation

## 2020-04-23 DIAGNOSIS — Z5112 Encounter for antineoplastic immunotherapy: Secondary | ICD-10-CM | POA: Diagnosis present

## 2020-04-23 DIAGNOSIS — R5383 Other fatigue: Secondary | ICD-10-CM | POA: Insufficient documentation

## 2020-04-23 DIAGNOSIS — R059 Cough, unspecified: Secondary | ICD-10-CM | POA: Diagnosis not present

## 2020-04-23 DIAGNOSIS — U071 COVID-19: Secondary | ICD-10-CM | POA: Insufficient documentation

## 2020-04-23 DIAGNOSIS — C8307 Small cell B-cell lymphoma, spleen: Secondary | ICD-10-CM

## 2020-04-23 DIAGNOSIS — C884 Extranodal marginal zone B-cell lymphoma of mucosa-associated lymphoid tissue [MALT-lymphoma]: Secondary | ICD-10-CM | POA: Diagnosis not present

## 2020-04-23 DIAGNOSIS — Z79899 Other long term (current) drug therapy: Secondary | ICD-10-CM | POA: Diagnosis not present

## 2020-04-23 DIAGNOSIS — Z993 Dependence on wheelchair: Secondary | ICD-10-CM | POA: Insufficient documentation

## 2020-04-23 DIAGNOSIS — R0602 Shortness of breath: Secondary | ICD-10-CM | POA: Diagnosis not present

## 2020-04-23 DIAGNOSIS — I4891 Unspecified atrial fibrillation: Secondary | ICD-10-CM | POA: Diagnosis not present

## 2020-04-23 DIAGNOSIS — C8300 Small cell B-cell lymphoma, unspecified site: Secondary | ICD-10-CM | POA: Diagnosis present

## 2020-04-23 DIAGNOSIS — Z7901 Long term (current) use of anticoagulants: Secondary | ICD-10-CM | POA: Insufficient documentation

## 2020-04-23 LAB — CBC WITH DIFFERENTIAL (CANCER CENTER ONLY)
Abs Immature Granulocytes: 0.1 10*3/uL — ABNORMAL HIGH (ref 0.00–0.07)
Band Neutrophils: 10 %
Basophils Absolute: 0 10*3/uL (ref 0.0–0.1)
Basophils Relative: 1 %
Eosinophils Absolute: 0 10*3/uL (ref 0.0–0.5)
Eosinophils Relative: 0 %
HCT: 31.5 % — ABNORMAL LOW (ref 36.0–46.0)
Hemoglobin: 10.2 g/dL — ABNORMAL LOW (ref 12.0–15.0)
Lymphocytes Relative: 2 %
Lymphs Abs: 0 10*3/uL — ABNORMAL LOW (ref 0.7–4.0)
MCH: 28.8 pg (ref 26.0–34.0)
MCHC: 32.4 g/dL (ref 30.0–36.0)
MCV: 89 fL (ref 80.0–100.0)
Metamyelocytes Relative: 4 %
Monocytes Absolute: 0.1 10*3/uL (ref 0.1–1.0)
Monocytes Relative: 3 %
Neutro Abs: 2 10*3/uL (ref 1.7–7.7)
Neutrophils Relative %: 80 %
Platelet Count: 71 10*3/uL — ABNORMAL LOW (ref 150–400)
RBC: 3.54 MIL/uL — ABNORMAL LOW (ref 3.87–5.11)
RDW: 18.6 % — ABNORMAL HIGH (ref 11.5–15.5)
Smear Review: 1
WBC Count: 2.2 10*3/uL — ABNORMAL LOW (ref 4.0–10.5)
nRBC: 1.8 % — ABNORMAL HIGH (ref 0.0–0.2)

## 2020-04-23 LAB — CMP (CANCER CENTER ONLY)
ALT: 53 U/L — ABNORMAL HIGH (ref 0–44)
AST: 46 U/L — ABNORMAL HIGH (ref 15–41)
Albumin: 3 g/dL — ABNORMAL LOW (ref 3.5–5.0)
Alkaline Phosphatase: 121 U/L (ref 38–126)
Anion gap: 8 (ref 5–15)
BUN: 27 mg/dL — ABNORMAL HIGH (ref 8–23)
CO2: 31 mmol/L (ref 22–32)
Calcium: 8.6 mg/dL — ABNORMAL LOW (ref 8.9–10.3)
Chloride: 98 mmol/L (ref 98–111)
Creatinine: 0.71 mg/dL (ref 0.44–1.00)
GFR, Estimated: >60 mL/min (ref >60)
Glucose, Bld: 176 mg/dL — ABNORMAL HIGH (ref 70–99)
Potassium: 3.6 mmol/L (ref 3.5–5.1)
Sodium: 137 mmol/L (ref 135–145)
Total Bilirubin: 0.9 mg/dL (ref 0.3–1.2)
Total Protein: 5 g/dL — ABNORMAL LOW (ref 6.5–8.1)

## 2020-04-23 LAB — RETICULOCYTES
Immature Retic Fract: 24.1 % — ABNORMAL HIGH (ref 2.3–15.9)
RBC.: 3.53 MIL/uL — ABNORMAL LOW (ref 3.87–5.11)
Retic Count, Absolute: 134.1 10*3/uL (ref 19.0–186.0)
Retic Ct Pct: 3.8 % — ABNORMAL HIGH (ref 0.4–3.1)

## 2020-04-23 LAB — LACTATE DEHYDROGENASE: LDH: 386 U/L — ABNORMAL HIGH (ref 98–192)

## 2020-04-23 MED ORDER — SODIUM CHLORIDE 0.9% FLUSH
10.0000 mL | Freq: Once | INTRAVENOUS | Status: AC
  Administered 2020-04-23: 10 mL via INTRAVENOUS
  Filled 2020-04-23: qty 10

## 2020-04-23 MED ORDER — HEPARIN SOD (PORK) LOCK FLUSH 100 UNIT/ML IV SOLN
500.0000 [IU] | Freq: Once | INTRAVENOUS | Status: AC
  Administered 2020-04-23: 500 [IU] via INTRAVENOUS
  Filled 2020-04-23: qty 5

## 2020-04-23 NOTE — Progress Notes (Signed)
Hematology and Oncology Follow Up Visit  Kristine Sullivan [id number] Aug 15, 1931 87 y.o. 04/23/2020   Principle Diagnosis:  Marginal zone lymphoma -- recurrent Iron deficiency anemia Autoimmune hemolytic anemia -- warm antibody  Past therapy: CVP -- completed 8 cycles on 04/09/2012 Maintenance Rituxan - 2 years completed in June 2016 Rituxan/Cytoxan -- startedon 11/08/2018, s/p cycle #2, Cytoxan d/c'd 12/19/2018 Rituxan/Bendamustine - startedon 04/17/2019, s/p cycle#6 --will omit day #2 of bendamustine for the last 2 cycles  Current Therapy: Rituxan-every 2 month dosing-maintenance-start 02/24/2020 IV Iron as needed   Interim History:  Kristine Sullivan is here today with her family for follow-up. She was in the hospital at [facility] from 2/7-2/23. She had fallen at home and work up revealed she was in atrial fib with RVR and was dehydrated. Further work up revealed she was positive for Covid.  She is currently on amiodarone and Cardizem. She started Eliquis during admission but this was held for several days due to GI blood loss and anemia. GI opted to monitor as her Hgb stayed stable. She is currently on Eliquis and has not noted any obvious blood loss. No abnormal bruising or petechiae.  She is on Protonix PO BID.  Hgb is 10.2, MCV 89, platelets 71 and WBC count 2.2.  She is symptomatic at this time with cough, SOB with any exertion, fatigue, weakness as well as numbness, tingling and some swelling in her lower extremities.  She is on 2L [place] supplemental O2 24 hours a day.  Pedal pulses are 2+.  She is on diuretics (Demadex and Aldactone) as well to help reduce fluid retention.  She is currently in a skilled nursing facility and has been refusing PT. I encouraged to to try to participate in PT as it will help with strength and stamina. At this time she is wheelchair bound and not ambulatory.  No fever, chills, n/v, rash, dizziness, chest pain, palpitations, abdominal pain or  changes in bowel or bladder habits.  Her appetite comes and goes. She is doing her best to stay well hydrated. Unable to stand for weight today.   ECOG Performance Status: 2 - Symptomatic, <50% confined to bed  Medications:  Allergies as of 04/23/2020      Reactions   Lisinopril Swelling   Facial swelling    Pregabalin Other (See Comments)   constipation   Timolol Swelling, Other (See Comments)      Medication List       Accurate as of April 23, 2020 11:47 AM. If you have any questions, ask your nurse or doctor.        acyclovir 400 MG tablet Commonly known as: ZOVIRAX TAKE 1 TABLET BY MOUTH EVERY DAY   acyclovir 400 MG tablet Commonly known as: ZOVIRAX 400 mg daily.   allopurinol 300 MG tablet Commonly known as: ZYLOPRIM TAKE 1 TABLET BY MOUTH EVERY DAY   allopurinol 300 MG tablet Commonly known as: ZYLOPRIM 1 tablet DAILY (route: oral)   amiodarone 200 MG tablet Commonly known as: PACERONE Take 200 mg by mouth daily.   amiodarone 200 MG tablet Commonly known as: PACERONE 1 tablet DAILY (route: oral)   apixaban 5 MG Tabs tablet Commonly known as: ELIQUIS Take 5 mg by mouth 2 (two) times daily.   atorvastatin 80 MG tablet Commonly known as: LIPITOR Take 40 mg by mouth daily.   Calcium Carbonate-Vitamin D3 600-400 MG-UNIT Tabs Take by mouth.   dexamethasone 4 MG tablet Commonly known as: DECADRON Take 2 tablets (8 mg total) by  mouth daily. Start the day after bendamustine chemotherapy for 2 days. Take with food.   diltiazem 90 MG 12 hr capsule Commonly known as: CARDIZEM SR TAKE 1 CAPSULE (90 MG TOTAL) BY MOUTH 2 (TWO) TIMES DAILY.   diphenhydrAMINE 25 MG tablet Commonly known as: SOMINEX Take 25 mg by mouth daily.   dorzolamide 2 % ophthalmic solution Commonly known as: TRUSOPT Place 1 drop into both eyes 3 (three) times daily.   furosemide 40 MG tablet Commonly known as: LASIX Take 40 mg by mouth daily.   ipratropium-albuterol 0.5-2.5 (3)  MG/3ML Soln Commonly known as: DUONEB INHALE 3 ML BY NEBULIZATION EVERY SIX (6) HOURS AS NEEDED.   latanoprost 0.005 % ophthalmic solution Commonly known as: XALATAN Place 1 drop into both eyes at bedtime.   lidocaine-prilocaine cream Commonly known as: EMLA Apply to affected area once   loratadine 10 MG tablet Commonly known as: CLARITIN OTC Take by mouth one a day for 10 days, then once a day as needed -allergies.   LORazepam 0.5 MG tablet Commonly known as: Ativan Take 1 tablet (0.5 mg total) by mouth every 6 (six) hours as needed (Nausea or vomiting).   magnesium oxide 400 MG tablet Commonly known as: MAG-OX Take 1 tablet by mouth daily.   Matzim LA 240 MG 24 hr tablet Generic drug: diltiazem Take 240 mg by mouth daily.   ondansetron 8 MG tablet Commonly known as: Zofran Take 1 tablet (8 mg total) by mouth 2 (two) times daily as needed for refractory nausea / vomiting. Start on day 2 after bendamustine chemo.   potassium chloride SA 20 MEQ tablet Commonly known as: KLOR-CON Take 1 tablet (20 mEq total) by mouth daily.   prochlorperazine 10 MG tablet Commonly known as: COMPAZINE Take 1 tablet (10 mg total) by mouth every 6 (six) hours as needed (Nausea or vomiting).   spironolactone 25 MG tablet Commonly known as: ALDACTONE Take by mouth.   torsemide 20 MG tablet Commonly known as: DEMADEX Take 20 mg by mouth daily.   Vitamin D (Ergocalciferol) 1.25 MG (50000 UNIT) Caps capsule Commonly known as: DRISDOL Take 1 capsule (50,000 Units total) by mouth once a week.       Allergies:  Allergies  Allergen Reactions  . Lisinopril Swelling    Facial swelling     . Pregabalin Other (See Comments)    constipation  . Timolol Swelling and Other (See Comments)    Past Medical History, Surgical history, Social history, and Family History were reviewed and updated.  Review of Systems: All other 10 point review of systems is negative.   Physical Exam:  oral  temperature is 99.1 F (37.3 C). Her blood pressure is 112/60 and her pulse is 85. Her respiration is 20 and oxygen saturation is 92%.   Wt Readings from Last 3 Encounters:  02/24/20 142 lb (64.4 kg)  01/13/20 147 lb (66.7 kg)  01/13/20 147 lb (66.7 kg)    Ocular: Sclerae unicteric, pupils equal, round and reactive to light Ear-nose-throat: Oropharynx clear, dentition fair Lymphatic: No cervical or supraclavicular adenopathy Lungs no rales or rhonchi, good excursion bilaterally Heart regular rate and rhythm at this time, no murmur appreciated Abd soft, nontender, positive bowel sounds MSK no focal spinal tenderness, no joint edema Neuro: non-focal, well-oriented, appropriate affect Breasts: Deferred   Lab Results  Component Value Date   WBC 2.2 (L) 04/23/2020   HGB 10.2 (L) 04/23/2020   HCT 31.5 (L) 04/23/2020   MCV 89.0 04/23/2020  PLT 71 (L) 04/23/2020   Lab Results  Component Value Date   FERRITIN 3,773 (H) 02/24/2020   IRON 102 02/24/2020   TIBC 271 02/24/2020   UIBC 169 02/24/2020   IRONPCTSAT 38 02/24/2020   Lab Results  Component Value Date   RETICCTPCT 3.8 (H) 04/23/2020   RBC 3.54 (L) 04/23/2020   RBC 3.53 (L) 04/23/2020   RETICCTABS 59.2 11/13/2013   No results found for: Nils Pyle [facility] Lab Results  Component Value Date   IGGSERUM 557 (L) 06/19/2013   IGA 100 06/19/2013   IGMSERUM 95 06/19/2013   Lab Results  Component Value Date   TOTALPROTELP 6.3 09/16/2011   ALBUMINELP 59.9 09/16/2011   A1GS 7.3 (H) 09/16/2011   A2GS 9.9 09/16/2011   BETS 8.4 (H) 09/16/2011   BETA2SER 4.8 09/16/2011   GAMS 9.7 (L) 09/16/2011   MSPIKE NOT DET 09/16/2011   SPEI * 09/16/2011     Chemistry      Component Value Date/Time   NA 136 02/24/2020 1025   NA 140 09/16/2016 0945   K 4.1 02/24/2020 1025   K 3.2 (L) 09/16/2016 0945   CL 98 02/24/2020 1025   CL 101 12/08/2014 1138   CL 99 05/28/2012 1036   CO2 28 02/24/2020 1025   CO2 26  09/16/2016 0945   BUN 20 02/24/2020 1025   BUN 9.0 09/16/2016 0945   CREATININE 1.03 (H) 02/24/2020 1025   CREATININE 0.7 09/16/2016 0945      Component Value Date/Time   CALCIUM 9.3 02/24/2020 1025   CALCIUM 9.0 09/16/2016 0945   ALKPHOS 100 02/24/2020 1025   ALKPHOS 122 09/16/2016 0945   AST 18 02/24/2020 1025   AST 25 09/16/2016 0945   ALT 24 02/24/2020 1025   ALT 15 09/16/2016 0945   BILITOT 0.7 02/24/2020 1025   BILITOT 0.90 09/16/2016 0945       Impression and Plan: Ms. Chesnut is a very pleasant 87 yo African American female with a history of marginal zone lymphoma, angioedema and autoimmune hemolytic anemia. She continues to slowly recuperate from her hospitalization in February.  I spoke with Dr. Marin Olp and we will hold treatment today.  Iron studies are pending. We will get her set up for replacement if needed.  Follow-up in 3 weeks.  They were encouraged to contact our office with any questions or concerns.   Laverna Peace, NP 3/3/202211:47 AM

## 2020-04-23 NOTE — Telephone Encounter (Signed)
called and left a vm with updated 04/23/20 los appts, ok to see sarah aging her secure chat message   anne

## 2020-04-23 NOTE — Telephone Encounter (Signed)
appts made and printed for pt per 04/23/20 los

## 2020-04-23 NOTE — Addendum Note (Signed)
Addended by: Amelia Jo I on: 04/23/2020 12:57 PM   Modules accepted: Orders

## 2020-04-23 NOTE — Patient Instructions (Signed)
Tunneled Central Venous Catheter Flushing Guide  It is important to flush your tunneled central venous catheter each time you use it, both before and after you use it. Flushing your catheter will help prevent it from clogging. What are the risks? Risks may include:  Infection.  Air getting into the catheter and bloodstream. Supplies needed:  A clean pair of gloves.  A disinfecting wipe. Use an alcohol wipe, chlorhexidine wipe, or iodine wipe as told by your health care provider.  A 10 mL syringe that has been prefilled with saline solution.  An empty 10 mL syringe, if a substance called heparin was injected into your catheter. How to flush your catheter When you flush your catheter, make sure you follow any specific instructions from your health care provider or the manufacturer. These are general guidelines. Flushing your catheter before use If there is heparin in your catheter: 1. Wash your hands with soap and water. 2. Put on gloves. 3. Scrub the injection cap for a minimum of 15 seconds with a disinfecting wipe. 4. Unclamp the catheter. 5. Attach the empty syringe to the injection cap. 6. Pull the syringe plunger back and withdraw 10 mL of blood. 7. Place the syringe into an appropriate waste container. 8. Scrub the injection cap for 15 seconds with a disinfecting wipe. 9. Attach the prefilled syringe to the injection cap. 10. Flush the catheter by pushing the plunger forward until all the liquid from the syringe is in the catheter. 11. Remove the syringe from the injection cap. 12. Clamp the catheter. If there is no heparin in your catheter: 1. Wash your hands with soap and water. 2. Put on gloves. 3. Scrub the injection cap for 15 seconds with a disinfecting wipe. 4. Unclamp the catheter. 5. Attach the prefilled syringe to the injection cap. 6. Flush the catheter by pushing the plunger forward until 5 mL of the liquid from the syringe is in the catheter. 7. Pull back on  the syringe until you see blood in the catheter. 8. If you have been asked to collect any blood, follow your health care provider's instructions. Otherwise, flush the catheter with the rest of the solution from the syringe. 9. Remove the syringe from the injection cap. 10. Clamp the catheter.   Flushing your catheter after use 1. Wash your hands with soap and water. 2. Put on gloves. 3. Scrub the injection cap for 15 seconds with a disinfecting wipe. 4. Unclamp the catheter. 5. Attach the prefilled syringe to the injection cap. 6. Flush the catheter by pushing the plunger forward until all of the liquid from the syringe is in the catheter. 7. Remove the syringe from the injection cap. 8. Clamp the catheter. Problems and solutions  If blood cannot be completely cleared from the injection cap, you may need to have the injection cap replaced.  If the catheter is difficult to flush, use the pulsing method. The pulsing method involves pushing only a few milliliters of solution into the catheter at a time and pausing between pushes.  If you do not see blood in the catheter when you pull back on the syringe, change your body position, such as by raising your arms above your head. Take a deep breath and cough. Then, pull back on the syringe. If you still do not see blood, flush the catheter with a small amount of solution. Then, change positions again and take a breath or cough. Pull back on the syringe again. If you still do not   see blood, finish flushing the catheter and contact your health care provider. Do not use your catheter until your health care provider says it is okay. General tips  Have someone help you flush your catheter, if possible.  Do not force fluid through your catheter.  Do not use a syringe that is larger or smaller than 10 mL. Using a smaller syringe can make the catheter burst.  Do not use your catheter without flushing it first if it has heparin in it. Contact a health  care provider if:  You cannot see any blood in the catheter when you flush it before using it.  Your catheter is difficult to flush. Get help right away if:  You cannot flush the catheter.  The catheter leaks when you flush it or when there is fluid in it.  There are cracks or breaks in the catheter. Summary  It is important to flush your tunneled central venous catheter each time you use it, both before and after you use it.  Scrub the injection cap for 15 seconds with a disinfecting wipe before and after you flush it.  When you flush your catheter, make sure you follow any specific instructions from your health care provider or the manufacturer.  Get help right away if you cannot flush the catheter. This information is not intended to replace advice given to you by your health care provider. Make sure you discuss any questions you have with your health care provider. Document Revised: 04/18/2019 Document Reviewed: 04/25/2018 [employer] Patient Education  2021 [employer]  

## 2020-04-24 ENCOUNTER — Other Ambulatory Visit: Payer: Self-pay | Admitting: Family

## 2020-04-24 LAB — IRON AND TIBC
Iron: 40 ug/dL — ABNORMAL LOW (ref 41–142)
Saturation Ratios: 18 % — ABNORMAL LOW (ref 21–57)
TIBC: 219 ug/dL — ABNORMAL LOW (ref 236–444)
UIBC: 179 ug/dL (ref 120–384)

## 2020-04-24 LAB — FERRITIN: Ferritin: 10920 ng/mL — ABNORMAL HIGH (ref 11–307)

## 2020-04-24 LAB — COLD AGGLUTININ TITER: Cold Agglutinin Titer: NEGATIVE

## 2020-05-14 ENCOUNTER — Other Ambulatory Visit: Payer: Medicare Other

## 2020-05-14 ENCOUNTER — Ambulatory Visit: Payer: Medicare Other | Admitting: Family

## 2020-05-15 ENCOUNTER — Other Ambulatory Visit: Payer: Medicare Other

## 2020-05-15 ENCOUNTER — Ambulatory Visit: Payer: Medicare Other | Admitting: Family

## 2020-05-15 ENCOUNTER — Ambulatory Visit: Payer: Medicare Other

## 2020-05-22 DEATH — deceased

## 2022-01-03 IMAGING — US US ABDOMEN COMPLETE
1 series · 14 of 25 positions shown · non-contrast
Comparison: 7837

CLINICAL DATA: History of lymphoma, assess for splenomegaly

EXAM:
ABDOMEN ULTRASOUND COMPLETE

[Series 1: us abdomen complete · 14 of 57 slices shown]
[im 1/57]
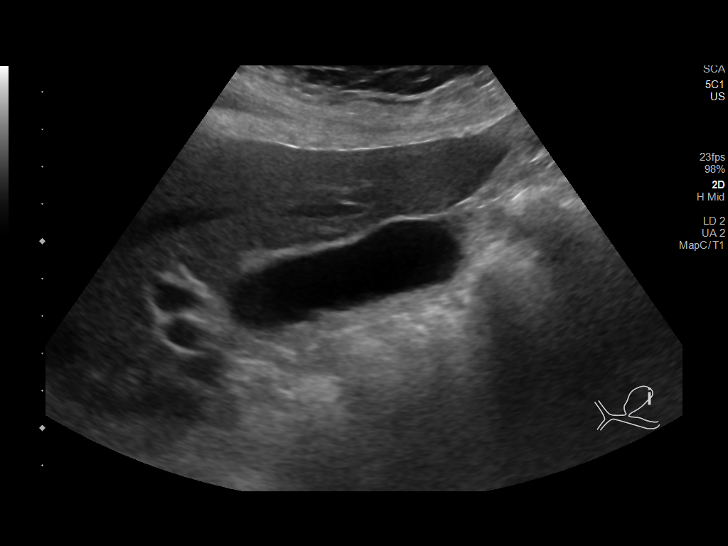
[im 5/57]
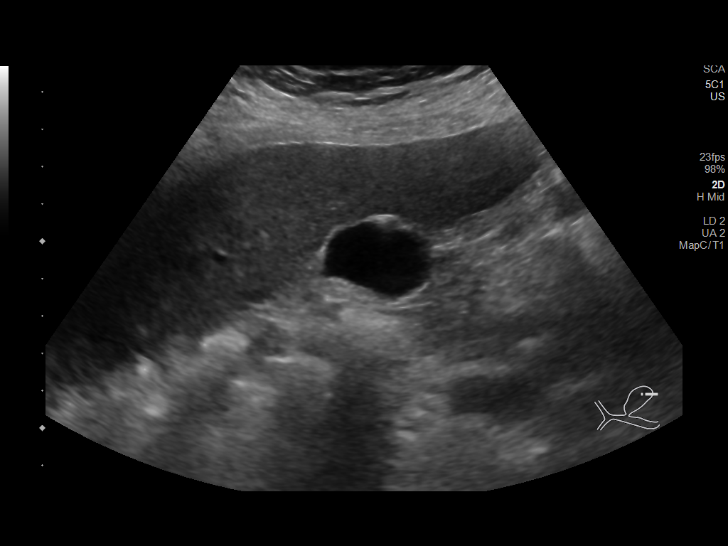
[im 10/57]
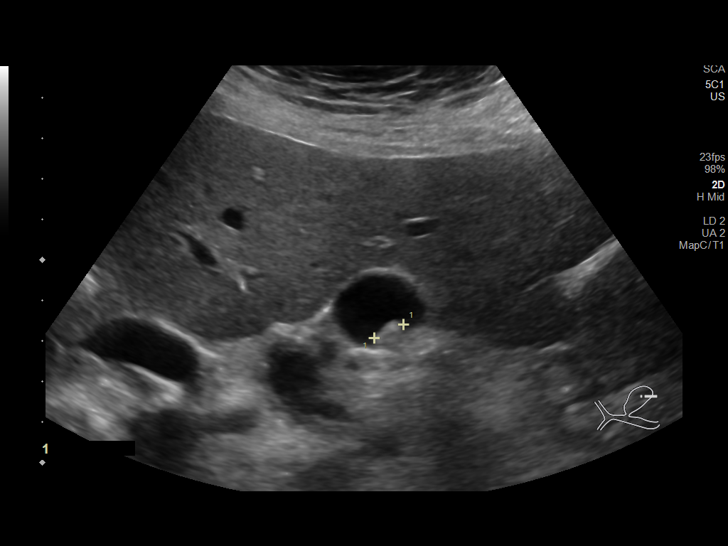
[im 15/57]
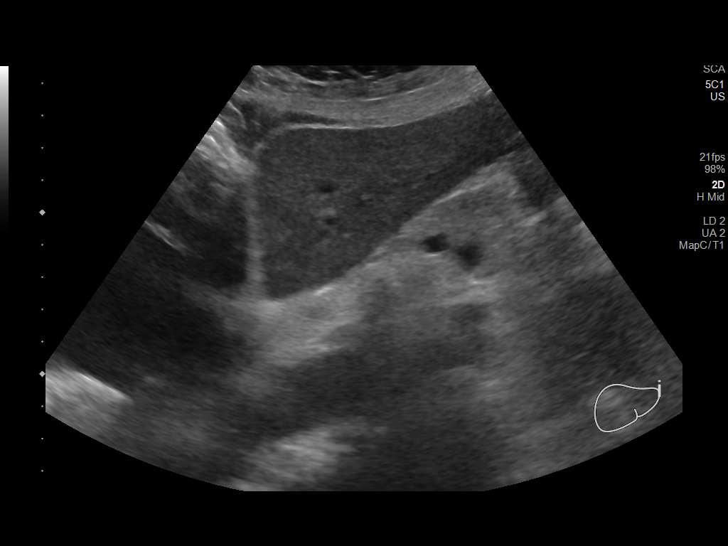
[im 19/57]
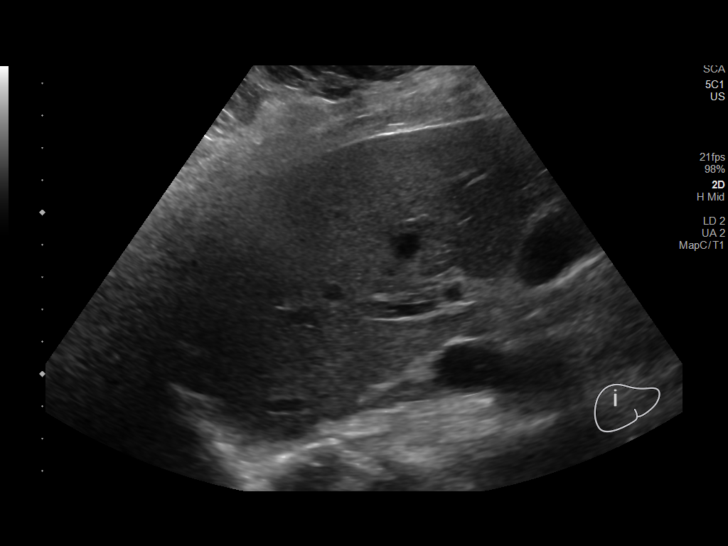
[im 22/57]
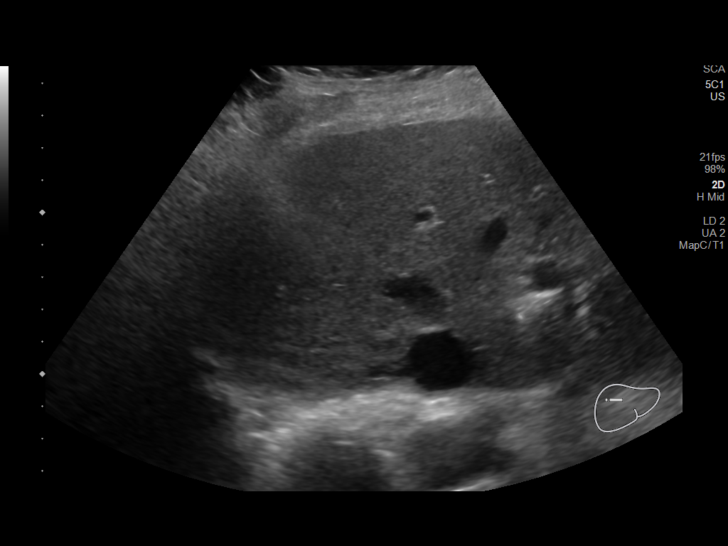
[im 26/57]
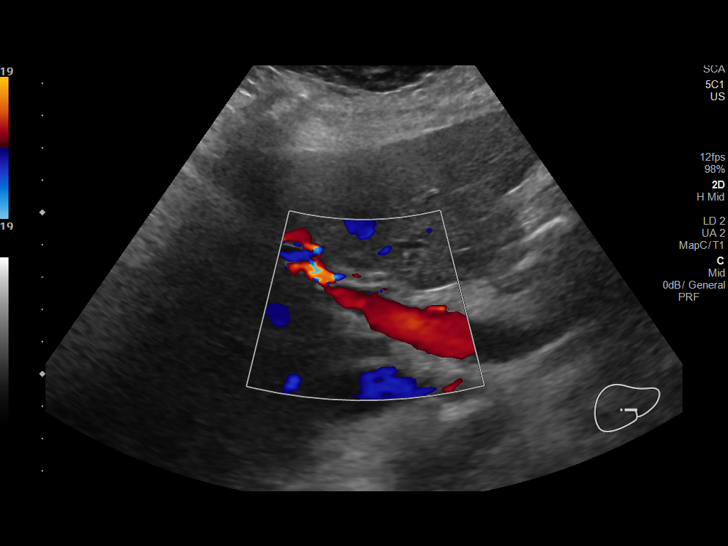
[im 31/57]
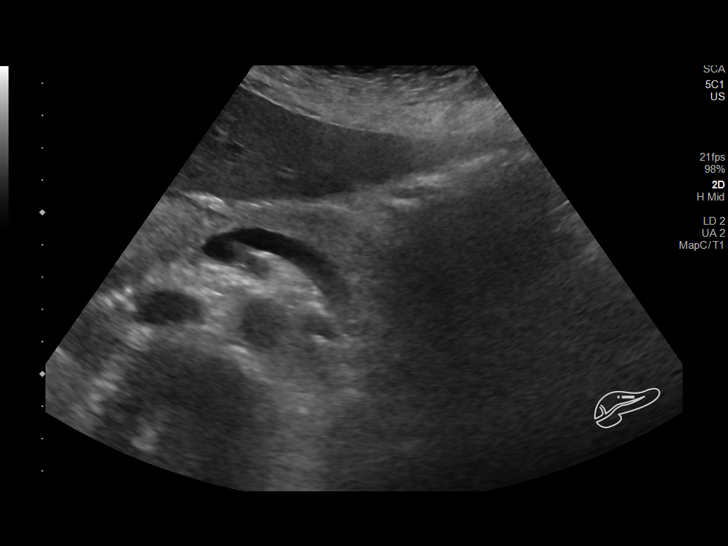
[im 36/57]
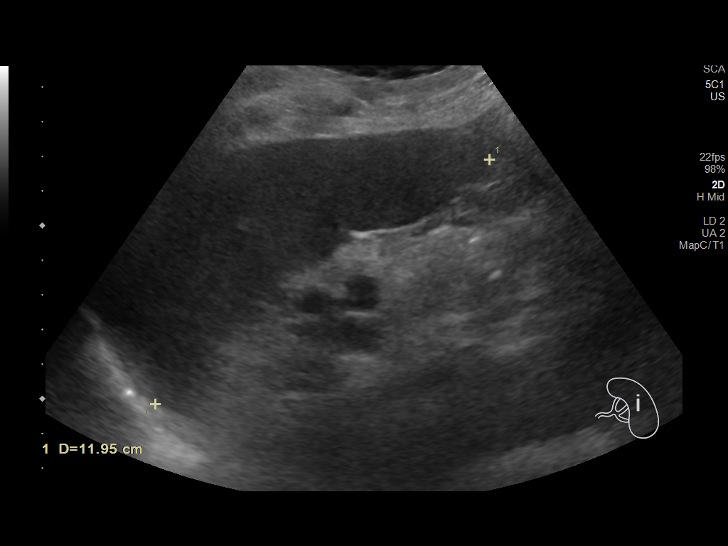
[im 38/57]
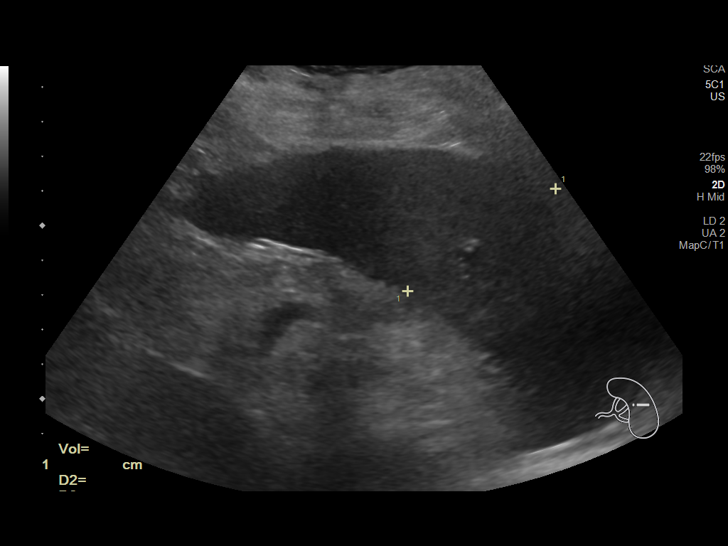
[im 43/57]
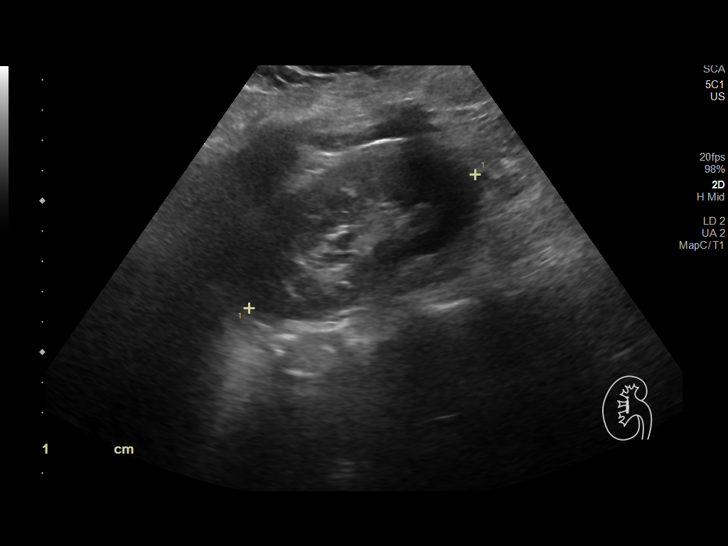
[im 47/57]
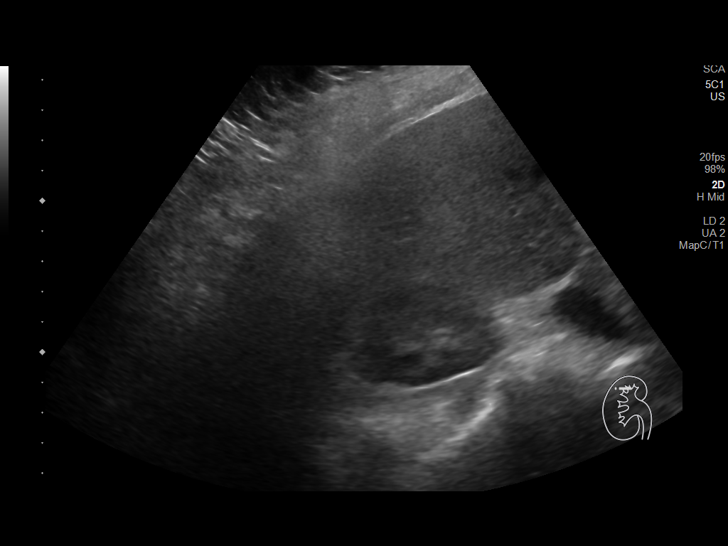
[im 52/57]
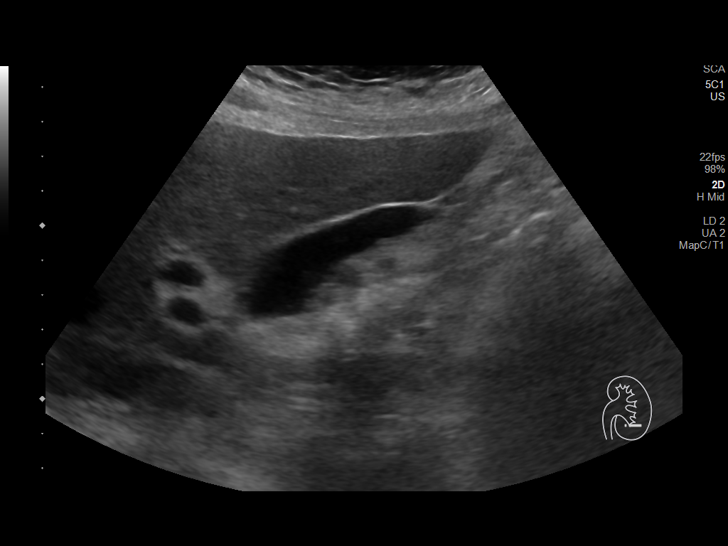
[im 57/57]
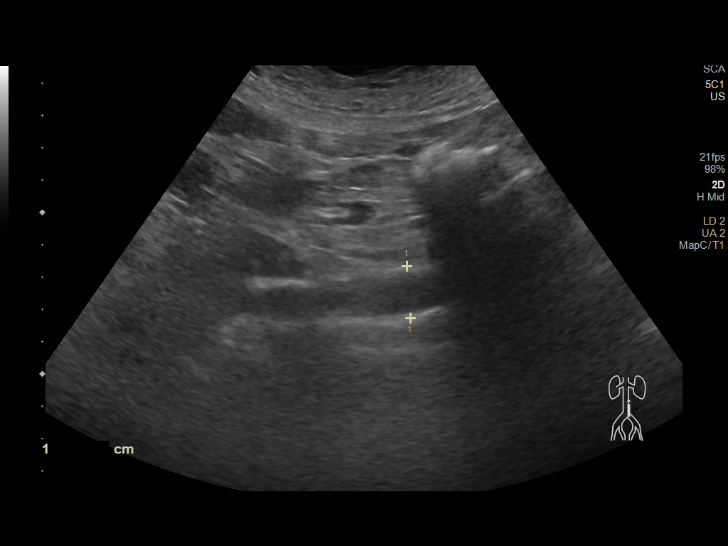

[14 of 25 positions shown; findings below may reference images not displayed]

FINDINGS: Gallbladder: Gallstones are present measuring up to 8 mm. No wall
thickening visualized. No sonographic Murphy sign noted by
sonographer.

Common bile duct: Diameter: 2.5 mm

Liver: No focal lesion identified. Within normal limits in
parenchymal echogenicity. Portal vein is patent on color Doppler
imaging with normal direction of blood flow towards the liver.

IVC: No abnormality visualized.

Pancreas: Visualized portion unremarkable.

Spleen: Appearance is within normal limits.  Measures 11.5 cm.

Right Kidney: Length: 8.7 cm. Echogenicity within normal limits. No
mass or hydronephrosis visualized.

Left Kidney: Length: 9.1 cm. Echogenicity within normal limits. No
mass or hydronephrosis visualized.

Abdominal aorta: No aneurysm visualized.

Other findings: None.
IMPRESSION: No splenomegaly.

Cholelithiasis.
